# Patient Record
Sex: Female | Born: 1980 | Race: Black or African American | Hispanic: No | Marital: Married | Smoking: Former smoker
Health system: Southern US, Community
[De-identification: ages and names within clinical notes are randomized; demographics above are authoritative.]

## PROBLEM LIST (undated history)

## (undated) DIAGNOSIS — N739 Female pelvic inflammatory disease, unspecified: Secondary | ICD-10-CM

## (undated) DIAGNOSIS — R87629 Unspecified abnormal cytological findings in specimens from vagina: Secondary | ICD-10-CM

## (undated) DIAGNOSIS — E282 Polycystic ovarian syndrome: Secondary | ICD-10-CM

## (undated) DIAGNOSIS — E669 Obesity, unspecified: Secondary | ICD-10-CM

## (undated) DIAGNOSIS — N83209 Unspecified ovarian cyst, unspecified side: Secondary | ICD-10-CM

## (undated) HISTORY — PX: DILATION AND CURETTAGE OF UTERUS: SHX78

## (undated) HISTORY — PX: LAPAROSCOPY: SHX197

## (undated) HISTORY — DX: Polycystic ovarian syndrome: E28.2

## (undated) HISTORY — DX: Female pelvic inflammatory disease, unspecified: N73.9

## (undated) HISTORY — DX: Unspecified ovarian cyst, unspecified side: N83.209

## (undated) HISTORY — DX: Unspecified abnormal cytological findings in specimens from vagina: R87.629

---
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
## 2002-09-05 ENCOUNTER — Other Ambulatory Visit: Admission: RE | Admit: 2002-09-05 | Discharge: 2002-09-05 | Payer: Self-pay | Admitting: Obstetrics and Gynecology

## 2002-09-05 ENCOUNTER — Other Ambulatory Visit: Admission: RE | Admit: 2002-09-05 | Discharge: 2002-09-05 | Payer: Self-pay | Admitting: *Deleted

## 2002-09-19 ENCOUNTER — Emergency Department (HOSPITAL_COMMUNITY): Admission: EM | Admit: 2002-09-19 | Discharge: 2002-09-19 | Payer: Self-pay | Admitting: Emergency Medicine

## 2002-09-24 ENCOUNTER — Inpatient Hospital Stay (HOSPITAL_COMMUNITY): Admission: AD | Admit: 2002-09-24 | Discharge: 2002-09-27 | Payer: Self-pay | Admitting: Obstetrics and Gynecology

## 2002-09-24 ENCOUNTER — Encounter: Payer: Self-pay | Admitting: Obstetrics and Gynecology

## 2002-11-06 ENCOUNTER — Ambulatory Visit (HOSPITAL_COMMUNITY): Admission: RE | Admit: 2002-11-06 | Discharge: 2002-11-06 | Payer: Self-pay | Admitting: Obstetrics and Gynecology

## 2002-11-06 ENCOUNTER — Encounter: Payer: Self-pay | Admitting: Obstetrics and Gynecology

## 2003-02-19 ENCOUNTER — Encounter: Payer: Self-pay | Admitting: Obstetrics and Gynecology

## 2003-02-19 ENCOUNTER — Ambulatory Visit (HOSPITAL_COMMUNITY): Admission: RE | Admit: 2003-02-19 | Discharge: 2003-02-19 | Payer: Self-pay | Admitting: Obstetrics and Gynecology

## 2003-05-04 ENCOUNTER — Encounter: Payer: Self-pay | Admitting: Obstetrics and Gynecology

## 2003-05-04 ENCOUNTER — Ambulatory Visit (HOSPITAL_COMMUNITY): Admission: RE | Admit: 2003-05-04 | Discharge: 2003-05-04 | Payer: Self-pay | Admitting: Obstetrics and Gynecology

## 2003-06-11 ENCOUNTER — Encounter (INDEPENDENT_AMBULATORY_CARE_PROVIDER_SITE_OTHER): Payer: Self-pay | Admitting: *Deleted

## 2003-06-12 ENCOUNTER — Inpatient Hospital Stay (HOSPITAL_COMMUNITY): Admission: AD | Admit: 2003-06-12 | Discharge: 2003-06-13 | Payer: Self-pay | Admitting: Obstetrics and Gynecology

## 2003-12-02 ENCOUNTER — Inpatient Hospital Stay (HOSPITAL_COMMUNITY): Admission: AD | Admit: 2003-12-02 | Discharge: 2003-12-02 | Payer: Self-pay | Admitting: Obstetrics and Gynecology

## 2004-01-31 ENCOUNTER — Emergency Department (HOSPITAL_COMMUNITY): Admission: EM | Admit: 2004-01-31 | Discharge: 2004-01-31 | Payer: Self-pay | Admitting: Emergency Medicine

## 2004-01-31 ENCOUNTER — Other Ambulatory Visit: Admission: RE | Admit: 2004-01-31 | Discharge: 2004-01-31 | Payer: Self-pay | Admitting: Obstetrics and Gynecology

## 2004-05-14 ENCOUNTER — Other Ambulatory Visit: Admission: RE | Admit: 2004-05-14 | Discharge: 2004-05-14 | Payer: Self-pay | Admitting: Obstetrics and Gynecology

## 2006-05-04 ENCOUNTER — Emergency Department (HOSPITAL_COMMUNITY): Admission: EM | Admit: 2006-05-04 | Discharge: 2006-05-04 | Payer: Self-pay | Admitting: Emergency Medicine

## 2009-11-24 ENCOUNTER — Emergency Department (HOSPITAL_COMMUNITY): Admission: EM | Admit: 2009-11-24 | Discharge: 2009-11-24 | Payer: Self-pay | Admitting: Family Medicine

## 2009-11-24 IMAGING — CR DG CHEST 2V
2 series · 2 of 2 positions shown · non-contrast
Comparison: None.

CLINICAL DATA: Chest pain

CHEST - 2 VIEW

[view not recorded (1 of 2)]
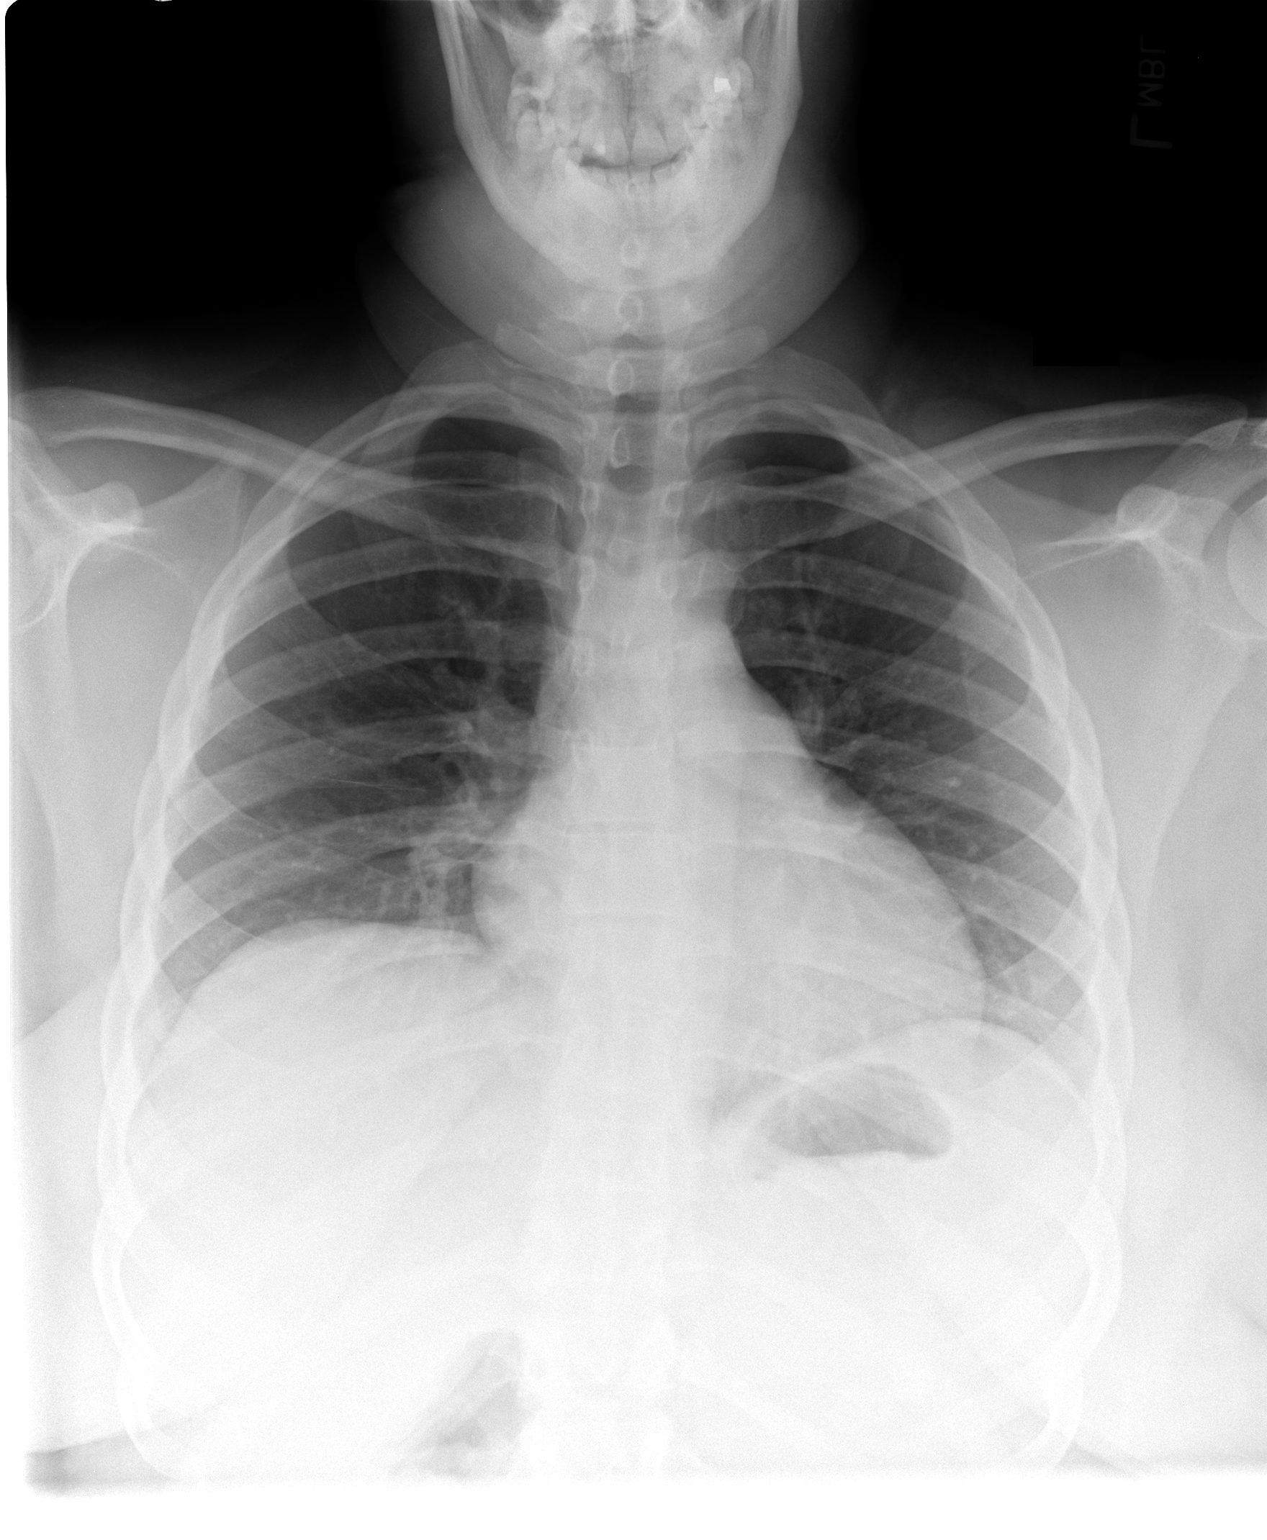

[view not recorded (2 of 2)]
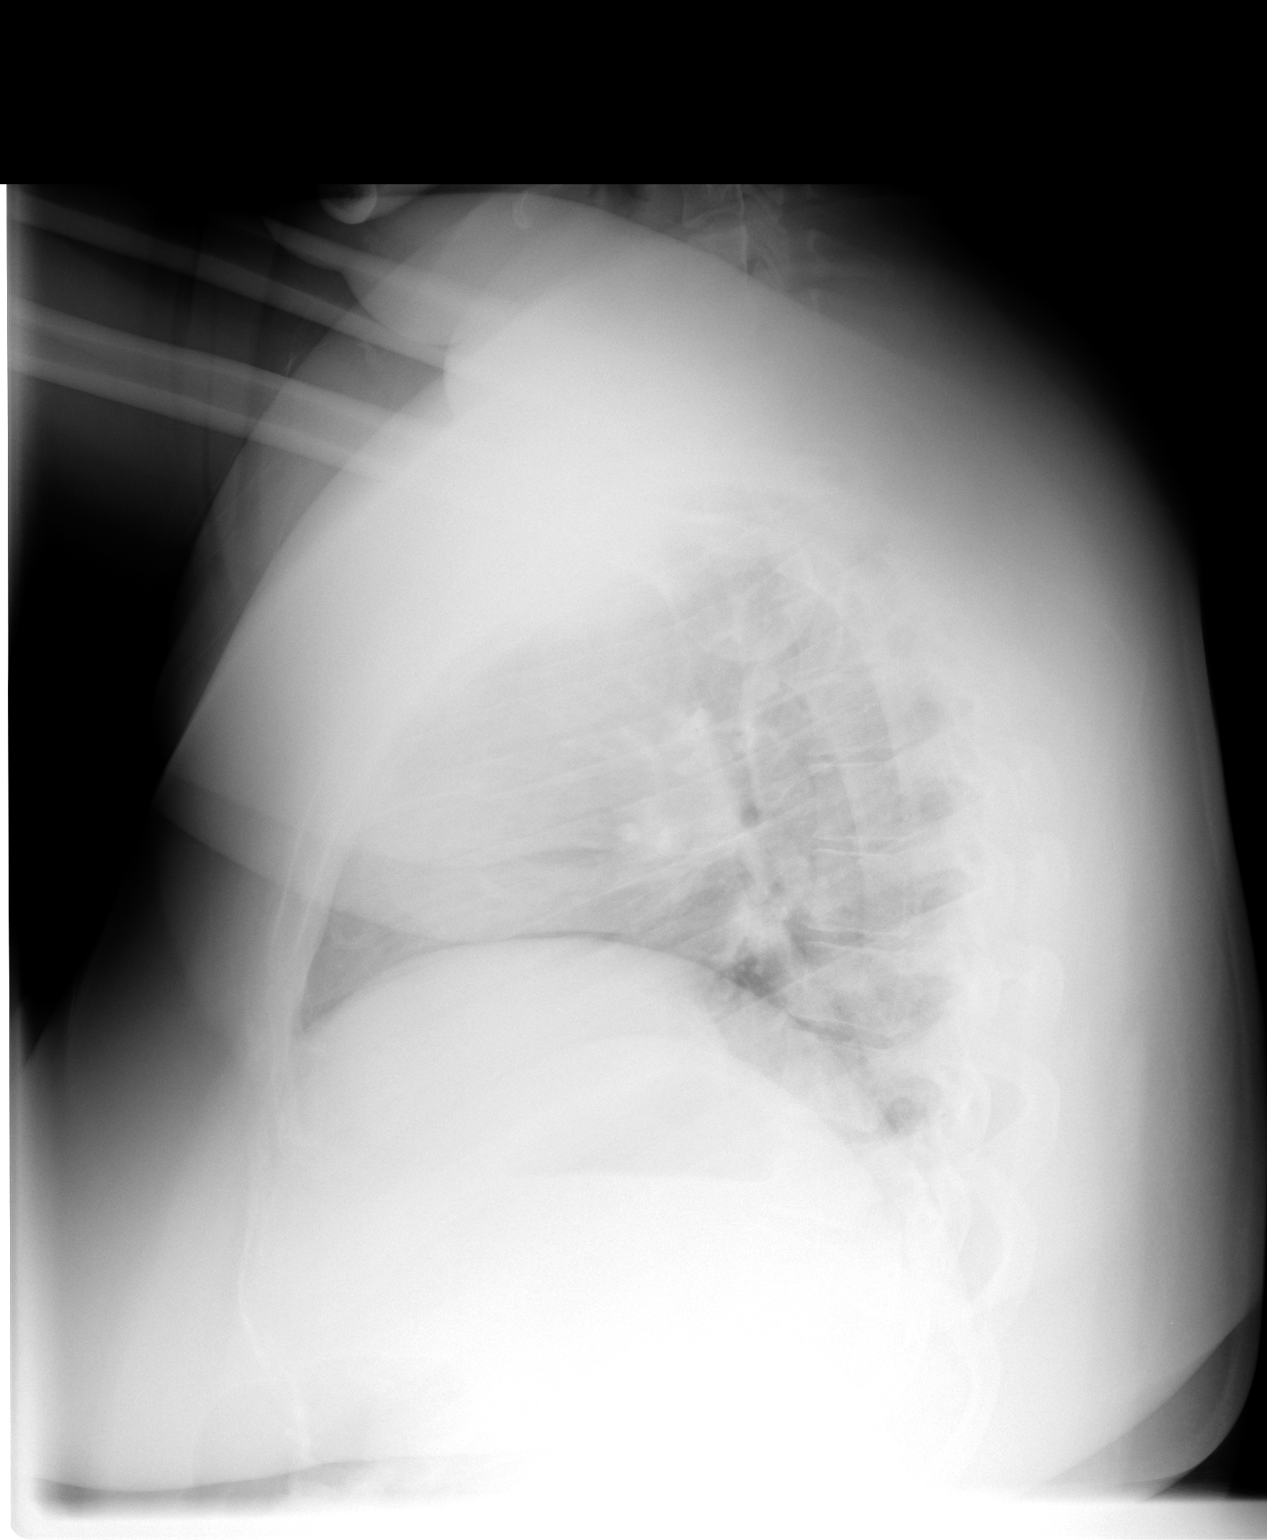

[2 of 2 positions shown; findings below may reference images not displayed]

FINDINGS: The heart size and mediastinal contours are within
normal limits.  Both lungs are clear.  The visualized skeletal
structures are unremarkable.
IMPRESSION: No active cardiopulmonary disease.

## 2010-08-14 ENCOUNTER — Ambulatory Visit (HOSPITAL_COMMUNITY)
Admission: AD | Admit: 2010-08-14 | Discharge: 2010-08-14 | Payer: Self-pay | Source: Home / Self Care | Attending: Obstetrics and Gynecology | Admitting: Obstetrics and Gynecology

## 2010-08-14 IMAGING — US US PELVIS COMPLETE MODIFY
1 series · 13 of 25 positions shown · non-contrast
Comparison: None.

CLINICAL DATA: Acute onset of severe right pelvic pain.  Negative
pregnancy test.

TRANSABDOMINAL AND TRANSVAGINAL ULTRASOUND OF PELVIS
DOPPLER ULTRASOUND OF OVARIES
TECHNIQUE: Both transabdominal and transvaginal ultrasound
examination of the pelvis were performed including evaluation of
the uterus, ovaries, adnexal regions, and pelvic cul-de-sac.
Transabdominal technique was performed for global imaging of the
pelvis.  Transvaginal technique was performed for detailed
evaluation of the endometrium and/or ovaries.  Color and duplex
Doppler ultrasound was utilized to evaluate blood flow to the
ovaries.

[Series 1: us pelvis complete modify · 13 of 72 slices shown]
[im 1/72]
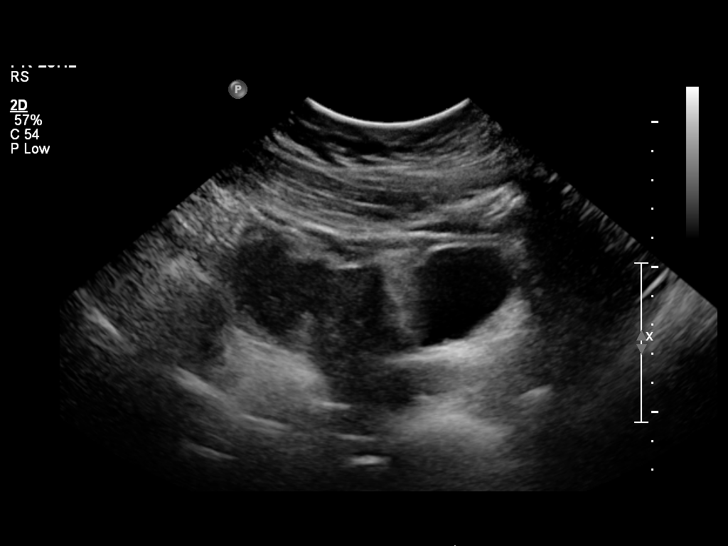
[im 6/72]
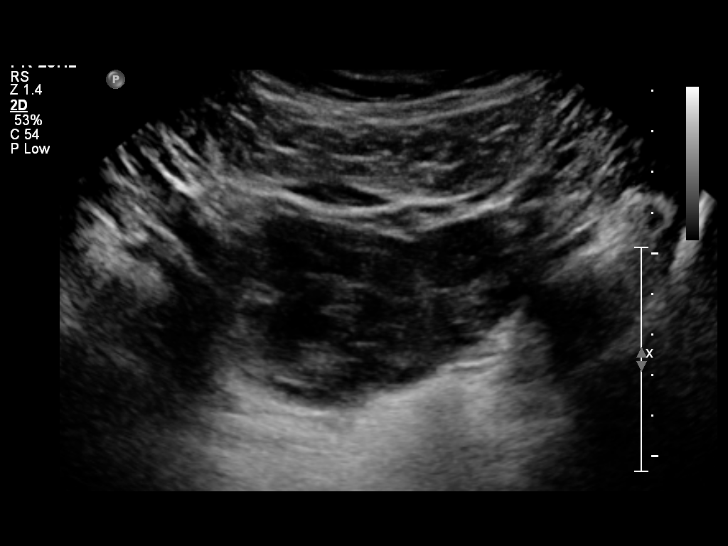
[im 12/72]
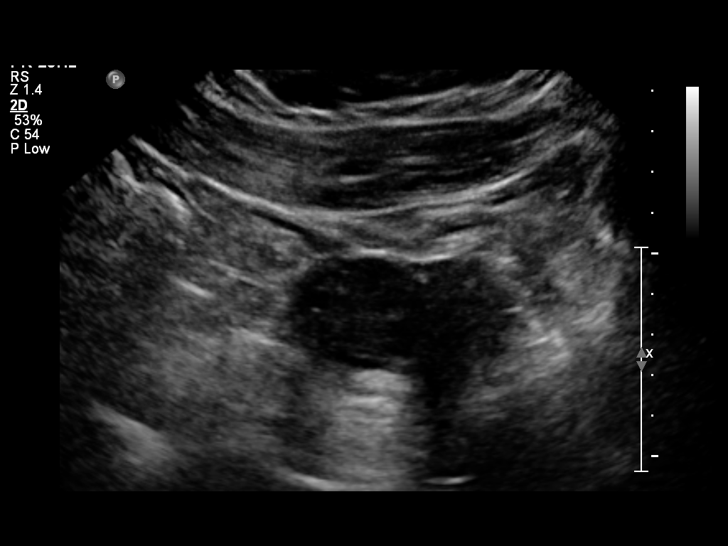
[im 18/72]
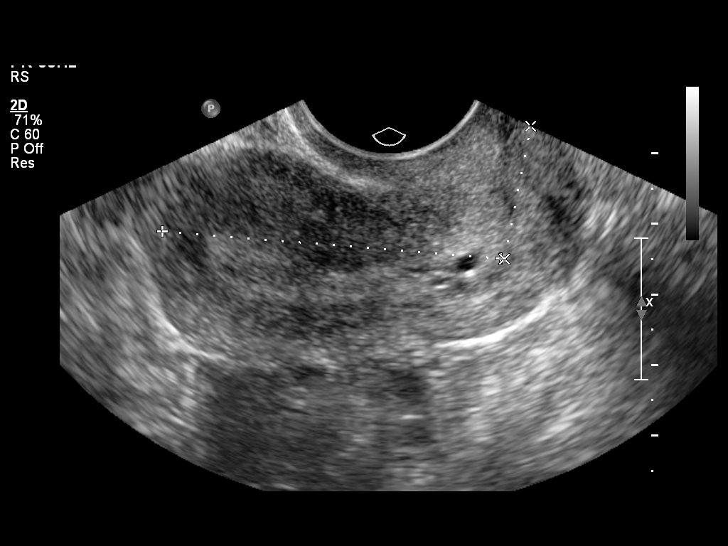
[im 24/72]
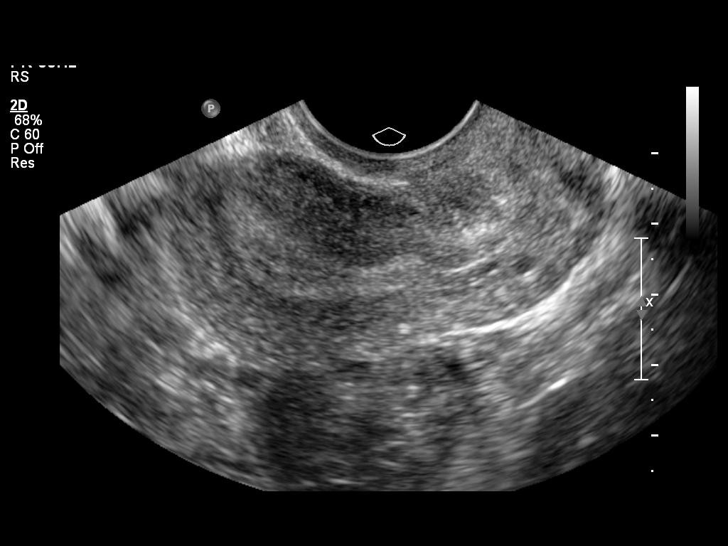
[im 30/72]
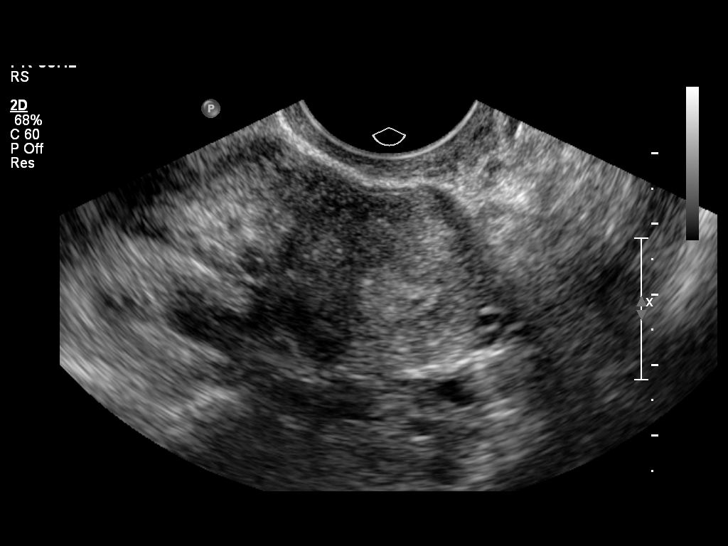
[im 36/72]
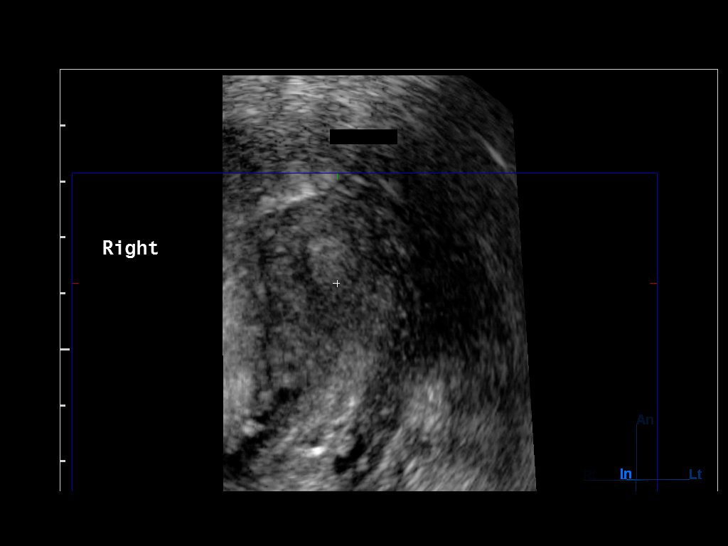
[im 42/72]
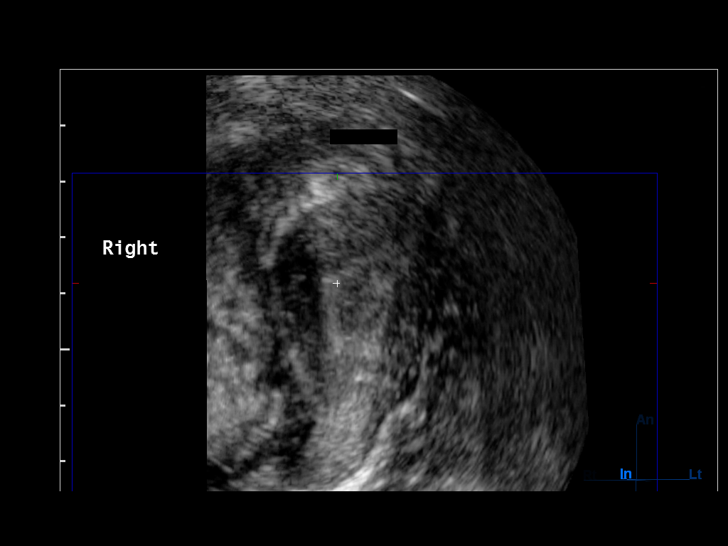
[im 48/72]
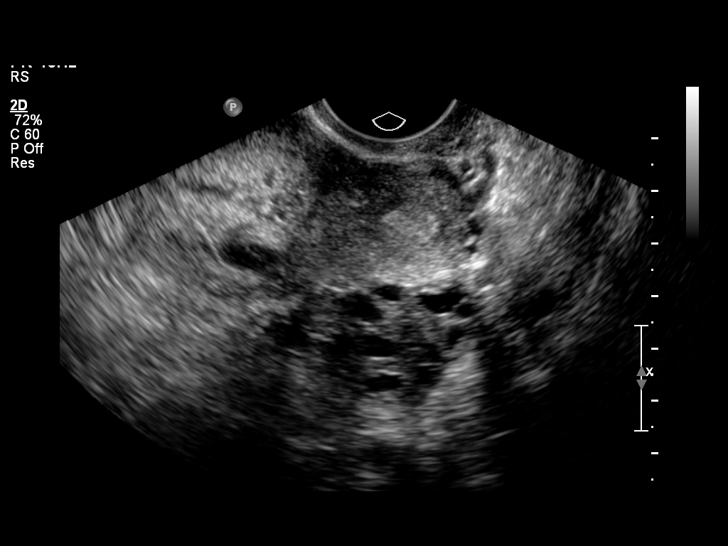
[im 54/72]
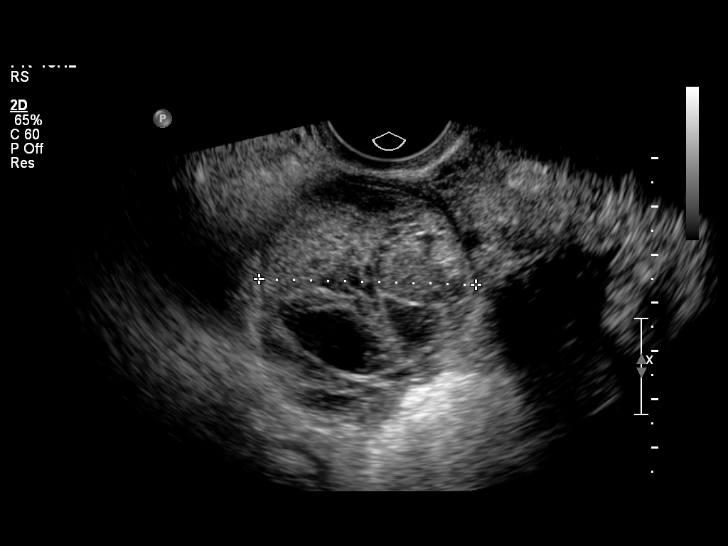
[im 60/72]
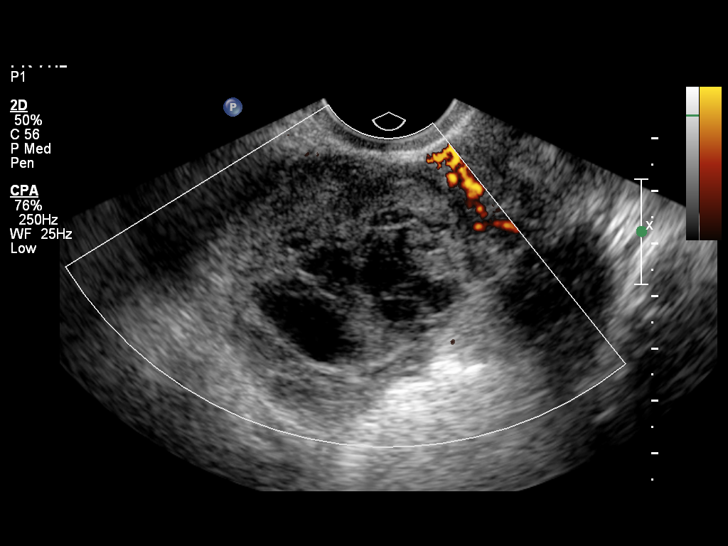
[im 66/72]
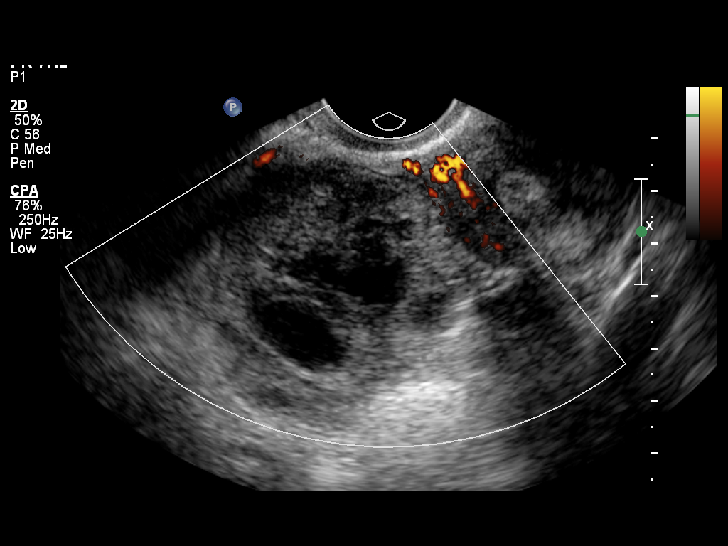
[im 72/72]
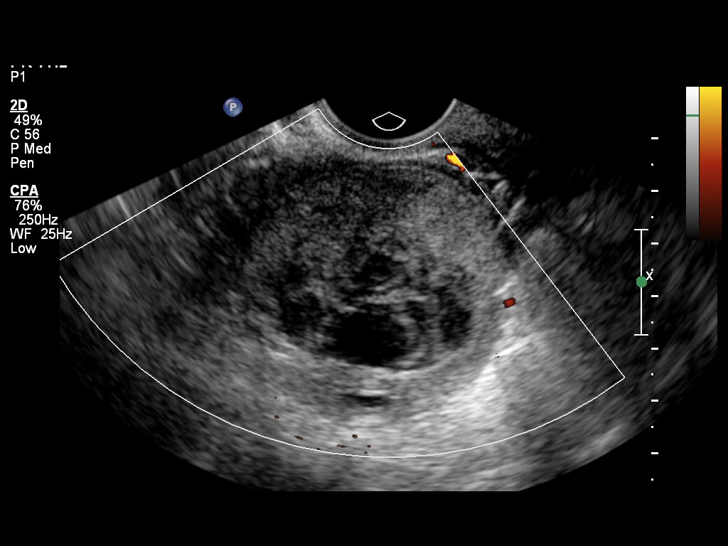

[13 of 25 positions shown; findings below may reference images not displayed]

FINDINGS: Uterus measures 6.7 x 3.0 x 3.5 cm.  No fibroids or other uterine
masses identified.

Endometrium measures 9 mm in thickness.  Within normal limits in
appearance.

Right Ovary measures 5.2 x 4.5 x 4.5 cm.  Enlarged and diffusely
heterogeneous appearance.  No normal appearing ovarian tissue
identified.

Left Ovary measures 3.1 x 2.2 x 2.1 cm.  Normal appearance.

Other Findings:  No other abnormality identified.

Doppler Evaluation:  Color, power, and duplex Doppler evaluation
show a small amount of arterial and venous flow peripheral to or
along the margin of the right ovary, however no blood flow is seen
within the right ovary.
IMPRESSION: 1.  Enlarged, abnormal appearing right ovary, with no evidence of
internal blood flow.  Findings are suspicious for acute ovarian
hemorrhage due to right ovarian torsion.
2.  Normal appearance of uterus and left ovary.

Critical test results telephoned to the patient's TOSEEF nurse TOSE[facility],
at the time of interpretation on [DATE] at [25] hours.

## 2010-11-03 LAB — CBC
HCT: 36.3 % (ref 36.0–46.0)
Platelets: 270 10*3/uL (ref 150–400)
RBC: 4.11 MIL/uL (ref 3.87–5.11)
RDW: 14.1 % (ref 11.5–15.5)
WBC: 8.5 10*3/uL (ref 4.0–10.5)

## 2010-11-03 LAB — URINALYSIS, ROUTINE W REFLEX MICROSCOPIC
Bilirubin Urine: NEGATIVE
Ketones, ur: NEGATIVE mg/dL
Protein, ur: NEGATIVE mg/dL
Urobilinogen, UA: 0.2 mg/dL (ref 0.0–1.0)

## 2010-11-12 LAB — POCT I-STAT, CHEM 8
Calcium, Ion: 1.12 mmol/L (ref 1.12–1.32)
HCT: 41 % (ref 36.0–46.0)
Sodium: 139 mEq/L (ref 135–145)
TCO2: 28 mmol/L (ref 0–100)

## 2010-11-12 LAB — D-DIMER, QUANTITATIVE: D-Dimer, Quant: <0.22 ug/mL-FEU (ref 0.00–0.48)

## 2010-11-12 LAB — POCT PREGNANCY, URINE: Preg Test, Ur: NEGATIVE

## 2011-01-09 NOTE — Discharge Summary (Signed)
NAME:  Erin [name], Erin [name]                         ACCOUNT NO.:  [id number]   MEDICAL RECORD NO.:  [id number]                   PATIENT TYPE:  INP   LOCATION:  9130                                 FACILITY:  [facility]   PHYSICIAN:  Osborn Coho, M.D.                DATE OF BIRTH:  05/31/1981   DATE OF ADMISSION:  09/24/2002  DATE OF DISCHARGE:  09/27/2002                                 DISCHARGE SUMMARY   ADMISSION DIAGNOSIS:  Probable tubo-ovarian abscess versus pelvic  inflammatory disease.   DISCHARGE DIAGNOSIS:  Probable tubo-ovarian abscess versus pelvic  inflammatory disease.   HOSPITAL COURSE:  The patient is a 30 year old, gravida 0, who had recently  tested positive for Gonorrhea and Chlamydia.  The patient reported continued  occasional nausea and vomiting even after treatment with ceftriaxone and  doxycycline in the office.  An office ultrasound showed questionable fluid  in the fallopian tube with simple left ovarian cyst and right complex  ovarian cyst.  The patient had bilateral lower quadrant discomfort,  bilateral adnexal discomfort, and was positive for cervical motion  tenderness.  The patient's white blood cell count was not elevated, it was  5.8, and the patient was afebrile.  The patient did not show overt signs of  infection in this manner which can be due to suboptimal outpatient therapy  with persistent symptoms.  The patient was admitted for IV antibiotics and  pelvic ultrasound.  The patient was placed on ampicillin, gentamicin, and  Clindamycin.  Her pain was controlled with Percocet and ibuprofen.  The  patient's symptoms improved over the next several days and was discharged  home on September 27, 2002, with a benign examination.  She was discharged  home on Augmentin and Doxycycline and was to follow up with Dr. Pennie Rushing in  the office in two weeks for test of cure and a follow-up ultrasound in six  weeks.   LABORATORY DATA:  White blood cell count 5.8 on  admission, hematocrit 37.1,  and platelets 321.  Positive Gonorrhea and Chlamydia probes from September 22, 2002, in the office. Ultrasound report in the hospital showed bilateral  hydrosalpinx and bilateral cysts with a simple right cyst and a left tubular  fixed structure/complex cyst.   CONDITION ON DISCHARGE:  Good.   DISCHARGE MEDICATIONS:  1. Augmentin 875 mg b.i.d.  2. Doxycycline 100 mg b.i.d. for remainder of 10 days.   DISCHARGE INSTRUCTIONS:  Pelvic rest.   FOLLOW UP:  Follow up scheduled with Dr. Pennie Rushing in two weeks for test of  cure. The patient was to be scheduled for a repeat ultrasound in six weeks.                                               Osborn Coho,  M.D.    AR/MEDQ  D:  12/04/2002  T:  12/04/2002  Job:  [id number]

## 2011-01-09 NOTE — Op Note (Signed)
NAME:  Erin [name], Erin [name]                         ACCOUNT NO.:  [id number]   MEDICAL RECORD NO.:  [id number]                   PATIENT TYPE:  OBV   LOCATION:  9306                                 FACILITY:  [facility]   PHYSICIAN:  Hal Morales, M.D.             DATE OF BIRTH:  11-Feb-1981   DATE OF PROCEDURE:  06/11/2003  DATE OF DISCHARGE:                                 OPERATIVE REPORT   PREOPERATIVE DIAGNOSES:  1. Pelvic pain.  2. History of pelvic inflammatory disease.  3. History of bilateral tubal ovarian complex.   POSTOPERATIVE DIAGNOSES:  1. Pelvic pain.  2. History of pelvic inflammatory disease .  3. Extensive pelvic adhesions.  4. Right and left tubal ovarian complexes, consistent with chronic pelvic     inflammatory disease.   OPERATION:  1. Operative laparoscopy with lysis of adhesions.  2. Biopsy of right ovarian cyst.   ANESTHESIA:  General orotracheal.   ESTIMATED BLOOD LOSS:  Less than 50 cc.   COMPLICATIONS:  None.   FINDINGS:  The uterus was normal size.  The right ovary was enlarged to 5  cm, with what appeared to be a corpus luteum cyst.  The left ovary was  normal sized.  Both ovaries were involved with adhesions to the overlying  tubes.  The right tube was adherent to the right pelvic sidewall, as well as  to the right ovary.  The posterior cul-de-sac contained fine adhesions  between the peritoneum and the posterior uterus.  In the left pelvic  sidewall there were fine adhesions between the left posterior uterus and the  left pelvic sidewall.   SPECIMENS TO PATHOLOGY:  1. Pelvic peritoneal fluid for anaerobic and aerobic cultures.  2. Adhesions, rule out endometriosis.  3. Biopsy of ovarian cyst, probable corpus luteum.   PROCEDURE:  The patient was taken to the operating room, after appropriate  identification and placed on the operating room table.  After the attainment  of adequate general anesthesia, she was placed in the modified  lithotomy  position.  The abdomen, perineum and vagina were prepped with multiple  layers of Betadine.  A Foley catheter was inserted into the bladder and  connected to straight drainage.  A single-toothed tenaculum was placed on  the anterior cervix.  The abdomen was draped as a sterile field.  Subumbilical and suprapubic  injections of 0.25% Marcaine for a total of 10  cc were undertaken.  A subumbilical incision was made and Veress cannula  placed through that incision into the peritoneal cavity.  A pneumoperitoneum  was created with 3 L of CO2.  The Veress cannula was removed and the  laparoscopic trocar placed through that incision into the peritoneal cavity.  The laparoscope was placed through the trocar sleeve.  Suprapubic incisions  were made to the right and left at midline and laparoscopic probe trocars  placed through those incisions into the peritoneal cavity,  under direct  visualization.  The above-noted findings were made and documented.  The  samples from the posterior cul-de-sac for culture were obtained.  Using a  combination of sharp dissection and harmonic scalpel, the adhesions in the  posterior cul-de-sac were lysed, as were the adhesions between the left  posterior uterus and left pelvic sidewall; as were the adhesions between the  left tube and ovary, and the right tube and ovary.  The right ovary  contained a cyst which upon dissection of the cortex appeared to be a corpus  luteum cyst.  A biopsy of the cyst wall was obtained and harmonic scalpels  used to achieve hemostasis.  Copious irrigation was carried out, after the  lysis of adhesion was complete.  Approximately 100 cc of warm saline was  left in the pelvis.  A total of 4 L of irrigation fluid was used to  copiously irrigate the pelvis, which had the stigmata of apparent old  infection.  All instruments were then removed from the peritoneal cavity  under direct visualization, as the CO2 was allowed to escape.   A  subumbilical incision was closed with a fascial suture of 0 Vicryl, then a  subcuticular suture of 3-0 Vicryl.  The suprapubic incisions were both  closed with subcuticular sutures of 3-0 Vicryl.  The single-toothed  tenaculum was removed, as was the Foley catheter, and sterile dressings  applied to the incisions.  The patient was awakened from general anesthesia  and taken to the recovery room in satisfactory condition; having tolerated  the procedure well.  Sponge and instrument count was correct.   A discussion with the patient and her mother postoperatively led to  admission to the hospital for intravenous antibiotics for apparent chronic  pelvic inflammatory disease, with significant pelvic adhesions.                                               Hal Morales, M.D.    VPH/MEDQ  D:  06/11/2003  T:  06/11/2003  Job:  [id number]

## 2011-01-09 NOTE — Discharge Summary (Signed)
NAME:  Erin [name], Erin [name]                         ACCOUNT NO.:  [id number]   MEDICAL RECORD NO.:  [id number]                   PATIENT TYPE:  INP   LOCATION:  9306                                 [facility]   PHYSICIAN:  Hal Morales, M.D.             DATE OF BIRTH:  05-21-1981   DATE OF ADMISSION:  06/11/2003  DATE OF DISCHARGE:  06/13/2003                                 DISCHARGE SUMMARY   DISCHARGE DIAGNOSES:  1. Pelvic pain.  2. History of pelvic inflammatory disease.  3. Pelvic adhesions.  4. Left and right tuboovarian complex consistent with chronic pelvic     inflammatory disease.   OPERATION:  On the date of admission the patient underwent an operative  laparoscopy with lysis of adhesions and biopsy of her right ovarian cyst,  tolerating procedure well.  The patient was found to have a normal-size  uterus and left ovary.  Her right ovary was enlarged to 5 cm with a corpus  luteum cyst.  Both ovaries were involved with adhesions to the overlying  tubes with the right tube adherent to the right pelvic sidewall.  The  patient's posterior cul-de-sac contained fine adhesion.   HISTORY OF PRESENT ILLNESS:  Ms. Erin [name] is a 30 year old single African-  American female with a history of pelvic inflammatory disease who is para 0  presenting for diagnostic laparoscopy because of persistent pelvic pain.  Please see the patient's dictated History and Physical Examination for  details.   PREOPERATIVE PHYSICAL EXAMINATION:  VITAL SIGNS:  Blood pressure 110/70,  weight is 222.5 pounds, height is 5 feet 3.25 inches tall.  GENERAL:  General exam is within normal limits; however, do note on  abdominal exam it is diffusely tender with guarding in the left lower  quadrant greater than the right lower quadrant.  However, there was no  rebound or organomegaly.  PELVIC:  EG/BUS was within normal limits.  The vagina was rugous.  Cervix is  nontender without lesion.  Uterus appears normal  size, shape, and  consistency though exam was limited by the patient's body habitus.  There is  tenderness over the fundal area and the adnexa are without masses though  there is tenderness bilaterally left greater than right.  Rectovaginal exam  is without masses.  There is tenderness in the posterior cul-de-sac.   HOSPITAL COURSE:  On the date of admission the patient underwent  aforementioned procedures, tolerating them well.  Postoperative course was  unremarkable with the patient resuming bowel and bladder function by  postoperative day #2 and therefore deemed ready for discharge home.  Postoperative hemoglobin is 11.5 (preoperative hemoglobin 13.5).   DISCHARGE MEDICATIONS:  1. Toradol 10 mg one tablet q.6h. as needed for pain.  2. Tequin 400 mg one tablet daily for 12 days.  3. Metronidazole 500 mg four times daily for 12 days.   FOLLOW-UP:  The patient is to follow up with  Dr. Pennie Rushing at [facility] in two weeks.   DISCHARGE INSTRUCTIONS:  The patient was given a copy of [facility]  [facility] home care instructions for laparoscopy.  She was further advised  to place nothing in her vaginal until after her postoperative exam.  The  patient's diet is regular.   FINAL PATHOLOGY:  1. Biopsy pelvic adhesions:  Fibrous adhesions with focal mesothelial     hyperplasia.  2. Biopsy ovarian cyst:  Benign cyst most consistent with follicular cyst.     Erin [name].                    Hal Morales, M.D.    EJP/MEDQ  D:  07/11/2003  T:  07/12/2003  Job:  [id number]

## 2011-01-09 NOTE — H&P (Signed)
NAME:  Erin [name], Erin [name]                         ACCOUNT NO.:  [id number]   MEDICAL RECORD NO.:  [id number]                   PATIENT TYPE:  AMB   LOCATION:  SDC                                  [facility]:  [facility]   PHYSICIAN:  Hal Morales, M.D.             DATE OF BIRTH:  1981/03/25   DATE OF ADMISSION:  DATE OF DISCHARGE:                                HISTORY & PHYSICAL   HISTORY OF PRESENT ILLNESS:  Erin [name] is a 30 year old single African-  American female para 0 who presents for diagnostic laparoscopy because of  persistent pelvic pain.  In February 2004 the patient was admitted to  [facility] for refractory pelvic pain at which time she  was diagnosed with pelvic inflammatory disease (having positive gonorrhea  and chlamydia cultures) along with bilateral hydrosalpinges.  The patient  was treated in the hospital with appropriate IV antibiotics and discharged  with oral antibiotics which she took for 14 days.  A follow-up pelvic  ultrasound in March 2004 showed resolution of her hydrosalpinges with  residual prominence of her right broad ligament which could have reflected  some right tubal thickening.  Since being discharged from the hospital in  February the patient has continued to experience pelvic pain which she  describes on occasion as if her bottom is going to fall out.  Additionally, she states that the pain is worse on the left than the right;  is sharp, dull, and crampy in nature which increases when she sits as well  as when she voids.  Initially this pain was intermittent; however, now it is  daily and constant.  She rates her pain at a 4-9 on a 10-point scale as the  degrees of intensity will vary.  However, she does experience some relief  from Naprosyn and Vicodin though they both put her to sleep.  In June 2004  the patient was again treated for gonorrhea and chlamydia (her test of cure  in March 2004 had been negative).  A follow-up  ultrasound in June 2004 was  within normal limits with no residual signs of tubal thickening or  abnormality.  A CT scan of the abdomen and pelvis with and without contrast  in September 2004 was also totally normal.  Throughout her ordeal the  patient denies any diarrhea, vaginitis symptoms, dysuria, urinary frequency,  or dyspareunia.  Due to the persistent nature of the patient's pelvic pain  and lack of response to oral therapy she has decided to undergo diagnosed  laparoscopy for further evaluation of her pain.   PAST MEDICAL HISTORY:  1. OB history:  Gravida 0 para 0.  2. GYN history:  Menarche at 30 years old.  The patient's menstrual periods     are regular with dysmenorrhea.  She uses birth control pills for     contraception.  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  The     patient's last Pap smear was January 2004 which revealed benign reactive     changes.  3. Medical history:  Positive for condyloma acuminata and dysmenorrhea.   FAMILY HISTORY:  Positive for asthma and endometriosis.   SOCIAL HISTORY:  The patient is single and she is a Holiday representative at [employer].   HABITS:  She does not use alcohol nor tobacco.   CURRENT MEDICATIONS:  1. Vicodin one tablet q.4-6h. as needed for pain.  2. Naproxen one tablet twice daily with food as needed for pain.  3. Extra Strength Tylenol two tablets q.4h. as needed for pain.  4. Ortho Tri-Cyclen Lo one tablet daily.   ALLERGIES:  No known drug allergies.   REVIEW OF SYSTEMS:  Negative except as mentioned in history of present  illness.   PHYSICAL EXAMINATION:  VITAL SIGNS:  The patient's blood pressure is 110/70,  weight is 222.5 pounds, height is 5 feet 3.25 inches tall.  NECK:  Supple.  There is no adenopathy or thyromegaly.  HEART:  Regular rate and rhythm.  There is no murmur.  LUNGS:  Clear to auscultation.  There  are no wheezes, rales, or rhonchi.  BACK:  No CVA tenderness.  ABDOMEN:  Diffusely tender with guarding in the left lower quadrant greater  than the right lower quadrant.  However, there is no rebound or  organomegaly.  EXTREMITIES:  Without clubbing, cyanosis, or edema.  PELVIC:  EG/BUS is within normal limits.  Vagina is rugous.  Cervix is  nontender without lesions.  Uterus appears normal size, shape, and  consistency though exam is limited by body habitus.  There is tenderness  over the fundal area.  Adnexa without masses though there is tenderness  bilaterally, left greater than right.  Rectovaginal exam without masses.  There is tenderness in the posterior cul-de-sac.   IMPRESSION:  1. Pelvic pain.  2. Status post pelvic inflammatory disease.   DISPOSITION:  A discussion was held with the patient regarding further  evaluation/management of her presenting symptoms to include diagnostic  laparoscopy or Lupron therapy.  The patient has decided to undergo  diagnostic laparoscopy for further evaluation of her persistent pelvic pain.  She understands the implications for this procedure along with its risks  which include but are not limited to reaction to anesthesia, damage to  adjacent organs, infection, and bleeding.  The patient has consented to  undergo diagnostic laparoscopy at [facility] [place] on June 11, 2003 at 1:45 p.m.     [place] J. Adline Peals.                    Hal Morales, M.D.    EJP/MEDQ  D:  06/06/2003  T:  06/06/2003  Job:  [id number]

## 2011-11-09 ENCOUNTER — Emergency Department (HOSPITAL_COMMUNITY): Payer: Self-pay

## 2011-11-09 ENCOUNTER — Encounter (HOSPITAL_COMMUNITY): Payer: Self-pay | Admitting: *Deleted

## 2011-11-09 ENCOUNTER — Emergency Department (HOSPITAL_COMMUNITY)
Admission: EM | Admit: 2011-11-09 | Discharge: 2011-11-10 | Disposition: A | Discharge status: Home / Self Care | Payer: Self-pay | Attending: Emergency Medicine | Admitting: Emergency Medicine

## 2011-11-09 DIAGNOSIS — T148XXA Other injury of unspecified body region, initial encounter: Secondary | ICD-10-CM | POA: Insufficient documentation

## 2011-11-09 DIAGNOSIS — M79609 Pain in unspecified limb: Secondary | ICD-10-CM | POA: Insufficient documentation

## 2011-11-09 DIAGNOSIS — M25569 Pain in unspecified knee: Secondary | ICD-10-CM | POA: Insufficient documentation
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
## 2011-11-10 NOTE — ED Provider Notes (Signed)
Medical screening examination/treatment/procedure(s) were performed by non-physician practitioner and as supervising physician I was immediately available for consultation/collaboration.   Lyanne Co, MD 11/10/11 [phone]

## 2011-11-11 ENCOUNTER — Encounter (HOSPITAL_COMMUNITY): Payer: Self-pay | Admitting: *Deleted

## 2011-11-11 ENCOUNTER — Emergency Department (HOSPITAL_COMMUNITY)
Admission: EM | Admit: 2011-11-11 | Discharge: 2011-11-11 | Disposition: A | Discharge status: Home / Self Care | Payer: Self-pay | Attending: Emergency Medicine | Admitting: Emergency Medicine

## 2011-11-11 DIAGNOSIS — M79605 Pain in left leg: Secondary | ICD-10-CM

## 2011-11-11 DIAGNOSIS — M79609 Pain in unspecified limb: Secondary | ICD-10-CM | POA: Insufficient documentation

## 2011-11-11 MED ORDER — DIAZEPAM 5 MG PO TABS: 5.0000 mg | ORAL_TABLET | Freq: Two times a day (BID) | ORAL | Status: AC
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
## 2011-11-11 NOTE — Progress Notes (Signed)
Orthopedic Tech Progress Note Patient Details:  Erin [name] 1981-07-02 [id number]  Other Ortho Devices Type of Ortho Device: Knee Sleeve Ortho Device Location: left knee Ortho Device Interventions: Application   Nikki Dom 11/11/2011, 4:15 PM
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
## 2011-11-11 NOTE — ED Notes (Signed)
Ortho paged for knee sleeve 

## 2011-11-12 NOTE — ED Provider Notes (Signed)
Medical screening examination/treatment/procedure(s) were performed by non-physician practitioner and as supervising physician I was immediately available for consultation/collaboration.   Raju Coppolino, MD 11/12/11 1445 

## 2019-11-27 ENCOUNTER — Emergency Department (HOSPITAL_BASED_OUTPATIENT_CLINIC_OR_DEPARTMENT_OTHER): Payer: BC Managed Care – PPO

## 2019-11-27 ENCOUNTER — Other Ambulatory Visit: Payer: Self-pay

## 2019-11-27 ENCOUNTER — Emergency Department (HOSPITAL_BASED_OUTPATIENT_CLINIC_OR_DEPARTMENT_OTHER)
Admission: EM | Admit: 2019-11-27 | Discharge: 2019-11-27 | Disposition: A | Discharge status: Home / Self Care | Payer: BC Managed Care – PPO | Attending: Emergency Medicine | Admitting: Emergency Medicine

## 2019-11-27 ENCOUNTER — Encounter (HOSPITAL_BASED_OUTPATIENT_CLINIC_OR_DEPARTMENT_OTHER): Payer: Self-pay | Admitting: Emergency Medicine

## 2019-11-27 DIAGNOSIS — R519 Headache, unspecified: Secondary | ICD-10-CM | POA: Insufficient documentation

## 2019-11-27 DIAGNOSIS — Z87891 Personal history of nicotine dependence: Secondary | ICD-10-CM | POA: Insufficient documentation

## 2019-11-27 HISTORY — DX: Obesity, unspecified: E66.9

## 2019-11-27 LAB — PREGNANCY, URINE: Preg Test, Ur: NEGATIVE

## 2019-11-27 IMAGING — CT CT HEAD W/O CM
3 series · 15 of 47 positions shown, 18 images · non-contrast
Comparison: None.

CLINICAL DATA: Headache after heavy exercise

EXAM:
CT HEAD WITHOUT CONTRAST
TECHNIQUE: Contiguous axial images were obtained from the base of the skull
through the vertex without intravenous contrast.

[Series 2: head wo · axial · 0.42mm/px · z∈[-102,+24]mm · 9 of 31 slices shown, 12 images]
[im 3/31  brain]
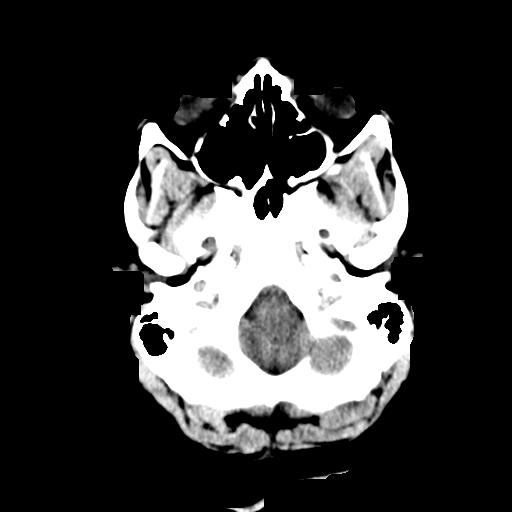
[im 3/31  bone]
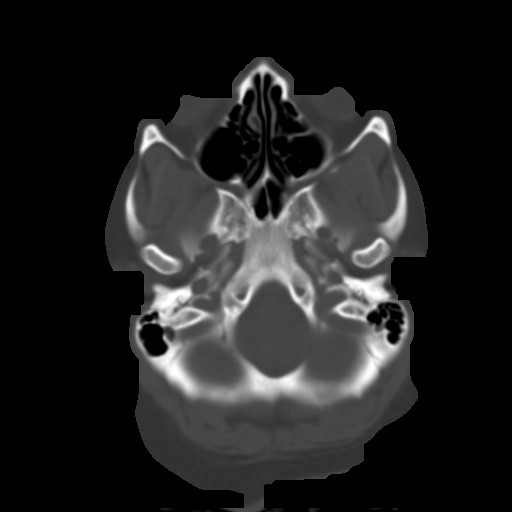
[im 6/31  brain]
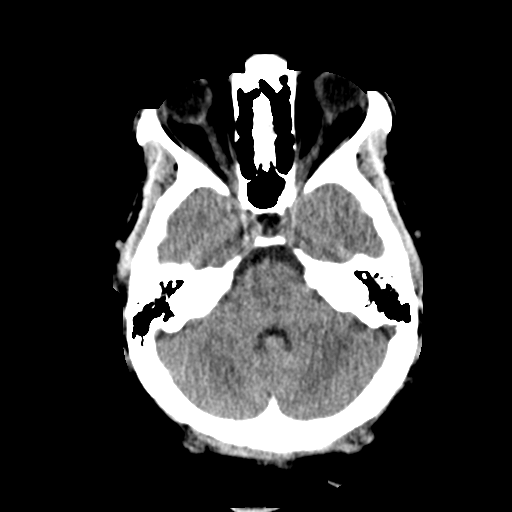
[im 9/31  brain]
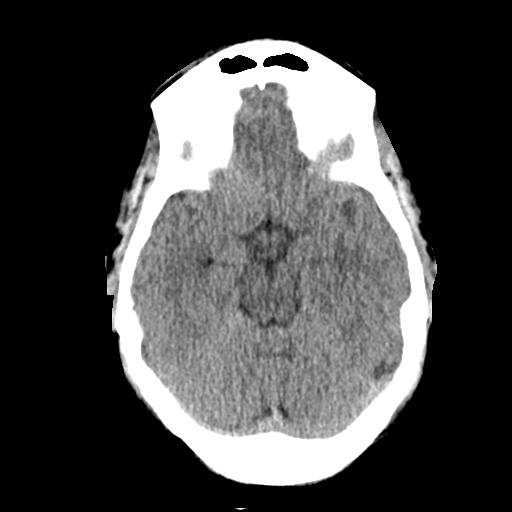
[im 12/31  brain]
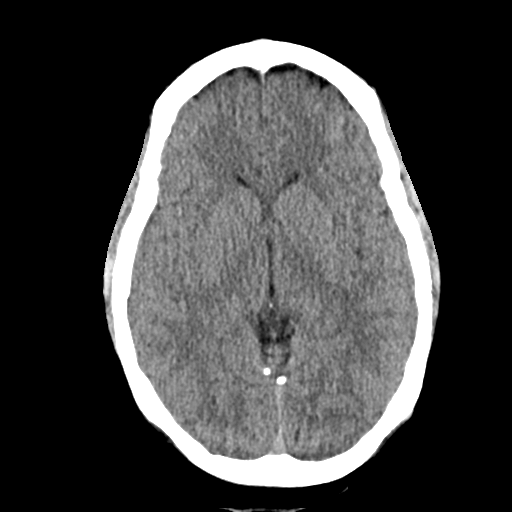
[im 16/31  brain]
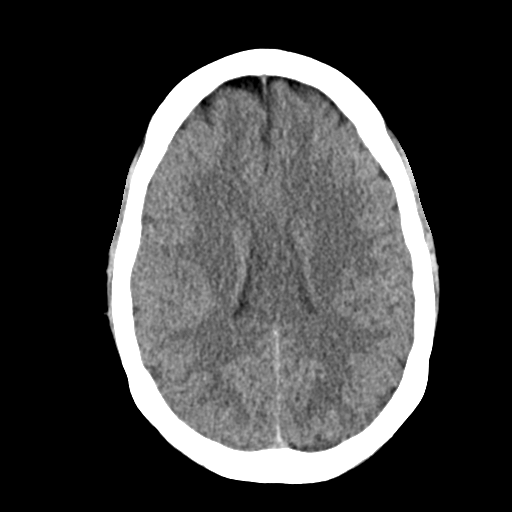
[im 16/31  bone]
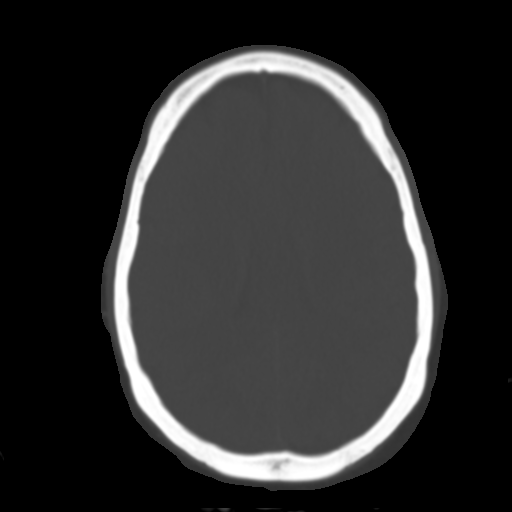
[im 19/31  brain]
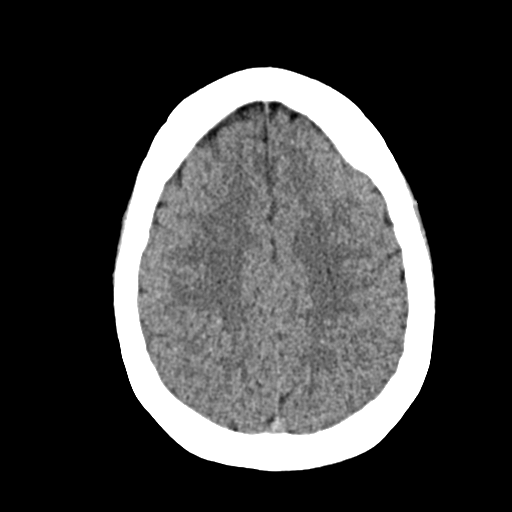
[im 22/31  brain]
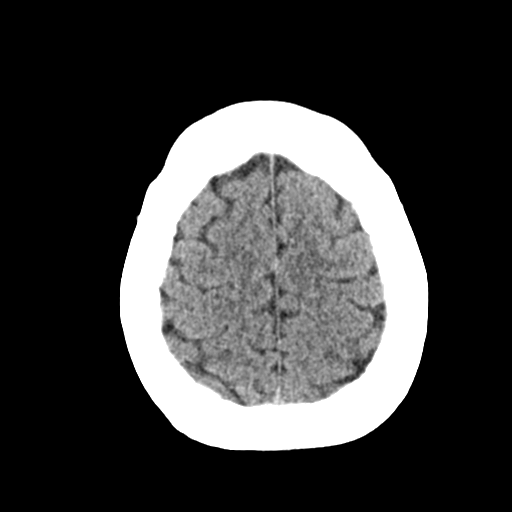
[im 25/31  brain]
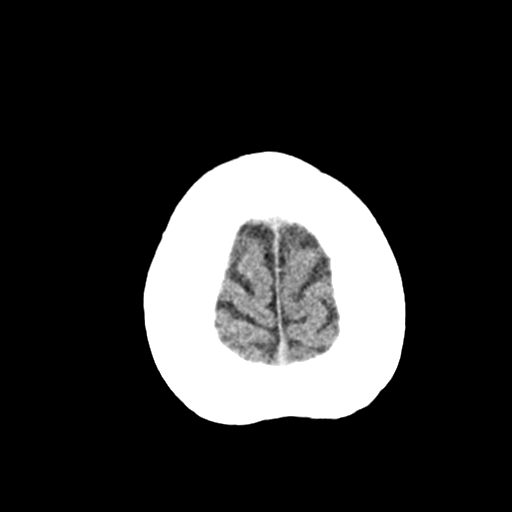
[im 28/31  brain]
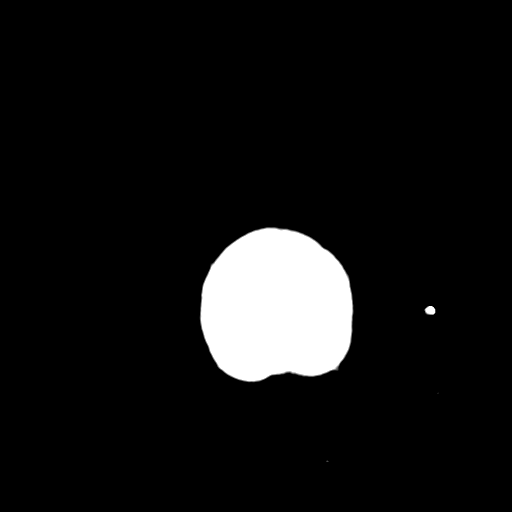
[im 28/31  bone]
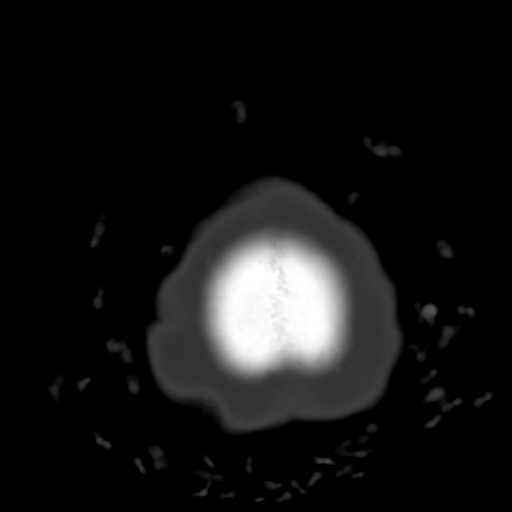

[Series 4: coronal soft · coronal · 0.30mm/px · 3 of 80 slices shown]
[im 27/80  brain]
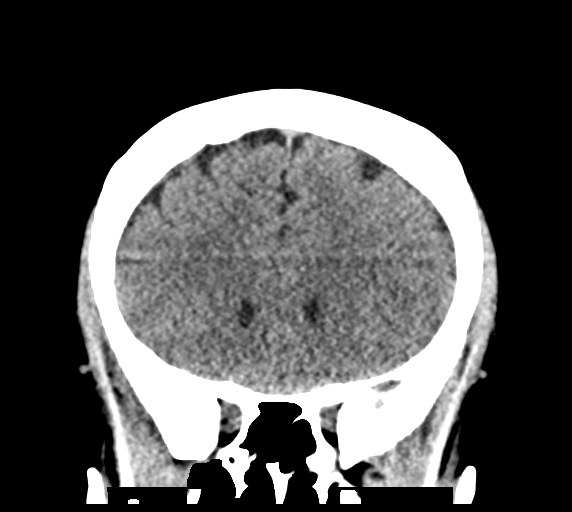
[im 36/80  brain]
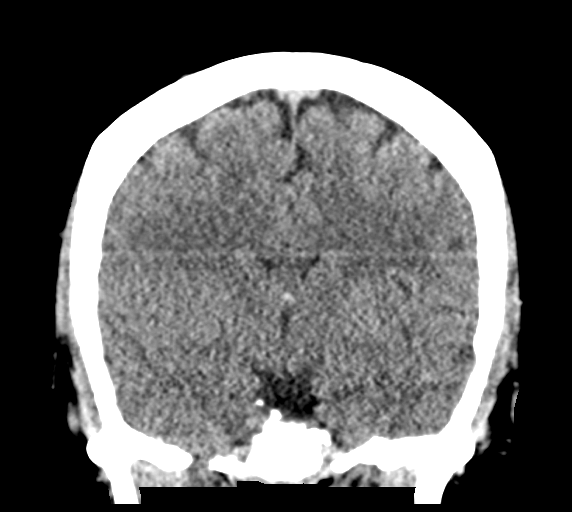
[im 44/80  brain]
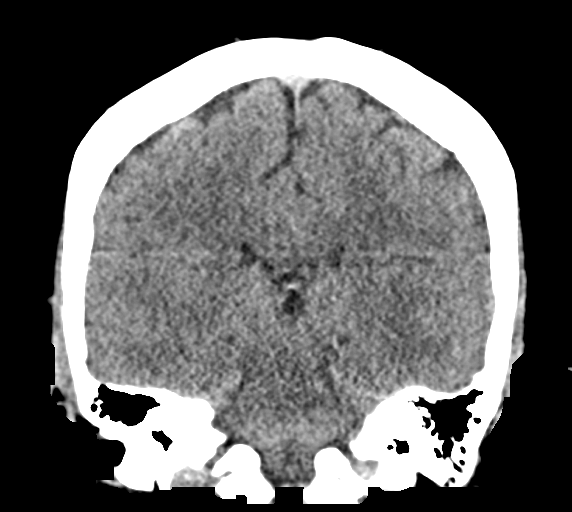

[Series 5: sag soft · sagittal · 0.29mm/px · 3 of 54 slices shown]
[im 18/54  brain]
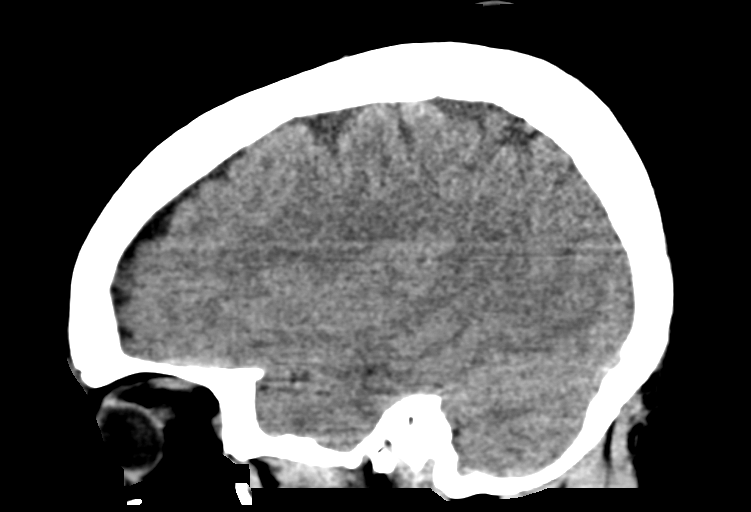
[im 27/54  brain]
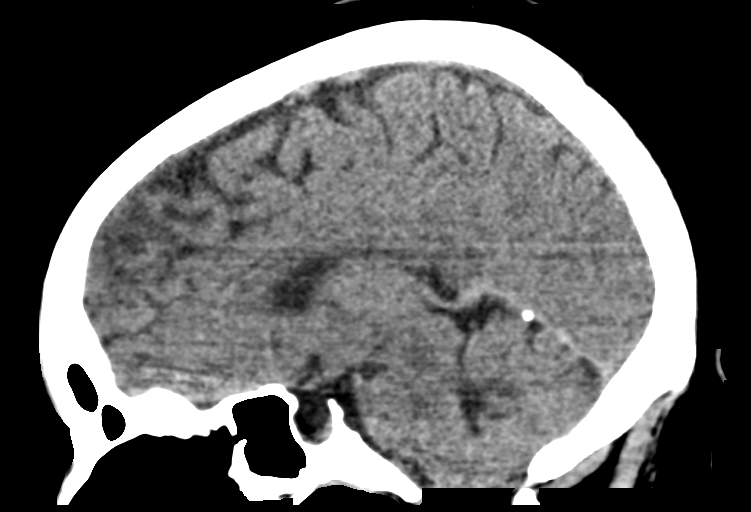
[im 36/54  brain]
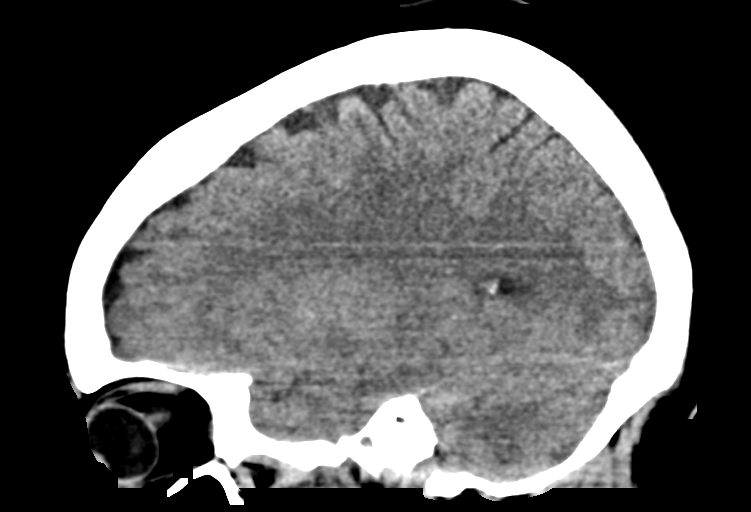

[15 of 47 positions shown; findings below may reference images not displayed]

FINDINGS: Brain: Ventricles and sulci are normal in size and configuration. No
evident cranial mass, hemorrhage, extra-axial fluid collection, or
midline shift. The brain parenchyma appears unremarkable. No evident
acute infarct.

Vascular: No hyperdense vessel.  No evident vascular calcification.

Skull: Bony calvarium appears intact.

Sinuses/Orbits: Visualized paranasal sinuses are clear. Orbits
appear symmetric bilaterally.

Other: Mastoid air cells are clear.
IMPRESSION: Study within normal limits.

## 2019-11-27 NOTE — ED Triage Notes (Signed)
Pt started having Headaches when working out at high intensity 8 days ago. Happened 3 days in a row.  She has not worked out again for several days.  Also had episode 2 years ago when she got a severe HA after orgasm.  That happened to her again this weekend.  HA was better yesterday but is back this morning after straining in the bathroom.

## 2019-11-27 NOTE — ED Provider Notes (Signed)
[facility] EMERGENCY DEPARTMENT Provider Note   CSN: [id number] Arrival date & time: 11/27/19  0935     History Chief Complaint  Patient presents with  . Headache    Erin [name] is a 39 y.o. female presents to emergency department today with chief complaint of progressively worsening headache x8 days.  Patient states she first noticed the headache when she was playing around and attempted to pick up her friend.  She describes the headache as a sudden surge of sharp pain throughout her head.  It lasted only minutes.  She states over the next several days she continued to have mild headaches intermittently throughout the day but after finishing her high intensity workouts every day the headache would be significantly worse, again she describes it has sharp shooting pain.  She also had an episode this weekend when she had  severe headache after having an orgasm.  She states this is happened in the past approximately 2 years ago, but she was never evaluated and has not had any problems with it since.  He has been taking Tylenol and ibuprofen for headaches which she states will completely resolve symptoms.  She has a mild headache right now rating it 4 out of 10 in severity and states it has progressively worsened since onset.  Denies fever, syncope, head trauma, photophobia, phonophobia, UL throbbing, N/V, visual changes, stiff neck, neck pain, rash, or "thunderclap" onset.   History provided by patient with additional history obtained from chart review.     Past Medical History:  Diagnosis Date  . Obesity     There are no problems to display for this patient.   Past Surgical History:  Procedure Laterality Date  . DILATION AND CURETTAGE OF UTERUS    . LAPAROSCOPY       OB History   No obstetric history on file.     No family history on file.  Social History   Tobacco Use  . Smoking status: Former Games developer  . Smokeless tobacco: Never Used  Substance Use Topics  .  Alcohol use: Yes    Comment: occ  . Drug use: Never    Home Medications Prior to Admission medications   Not on File    Allergies    Patient has no known allergies.  Review of Systems   Review of Systems All other systems are reviewed and are negative for acute change except as noted in the HPI.  Physical Exam Updated Vital Signs BP (!) 145/90 (BP Location: Right Arm)   Pulse 71   Temp 98.1 F (36.7 C) (Oral)   Resp 16   Ht 5\' 3"  (1.6 m)   Wt 116.5 kg   LMP 11/16/2019   SpO2 99%   BMI 45.51 kg/m   Physical Exam Vitals and nursing note reviewed.  Constitutional:      General: She is not in acute distress.    Appearance: She is not ill-appearing.  HENT:     Head: Normocephalic and atraumatic.     Comments: No sinus or temporal tenderness.    Right Ear: Tympanic membrane and external ear normal.     Left Ear: Tympanic membrane and external ear normal.     Nose: Nose normal.     Mouth/Throat:     Mouth: Mucous membranes are moist.     Pharynx: Oropharynx is clear.  Eyes:     General: No scleral icterus.       Right eye: No discharge.  Left eye: No discharge.     Extraocular Movements: Extraocular movements intact.     Conjunctiva/sclera: Conjunctivae normal.     Pupils: Pupils are equal, round, and reactive to light.  Neck:     Vascular: No JVD.     Comments: Full ROM intact without spinous process TTP. No bony stepoffs or deformities, no paraspinous muscle TTP or muscle spasms. No rigidity or meningeal signs. No bruising, erythema, or swelling.  Cardiovascular:     Rate and Rhythm: Normal rate and regular rhythm.     Pulses: Normal pulses.          Radial pulses are 2+ on the right side and 2+ on the left side.     Heart sounds: Normal heart sounds.  Pulmonary:     Comments: Lungs clear to auscultation in all fields. Symmetric chest rise. No wheezing, rales, or rhonchi. Abdominal:     Comments: Abdomen is soft, non-distended, and non-tender in all  quadrants. No rigidity, no guarding. No peritoneal signs.  Musculoskeletal:        General: Normal range of motion.     Cervical back: Normal range of motion.  Skin:    General: Skin is warm and dry.     Capillary Refill: Capillary refill takes less than 2 seconds.  Neurological:     Mental Status: She is oriented to person, place, and time.     GCS: GCS eye subscore is 4. GCS verbal subscore is 5. GCS motor subscore is 6.     Comments: Mental Status:  Alert, oriented, thought content appropriate, able to give a coherent history. Speech fluent without evidence of aphasia. Able to follow 2 step commands without difficulty.  Cranial Nerves:  II:  Peripheral visual fields grossly normal, pupils equal, round, reactive to light III,IV, VI: ptosis not present, extra-ocular motions intact bilaterally  V,VII: smile symmetric, facial light touch sensation equal VIII: hearing grossly normal to voice  X: uvula elevates symmetrically  XI: bilateral shoulder shrug symmetric and strong XII: midline tongue extension without fassiculations Motor:  Normal tone. 5/5 in upper and lower extremities bilaterally including strong and equal grip strength and dorsiflexion/plantar flexion Sensory: Pinprick and light touch normal in all extremities.  Deep Tendon Reflexes: 2+ and symmetric in the biceps and patella Cerebellar: normal finger-to-nose with bilateral upper extremities Gait: normal gait and balance CV: distal pulses palpable throughout     Psychiatric:        Behavior: Behavior normal.     ED Results / Procedures / Treatments   Labs (all labs ordered are listed, but only abnormal results are displayed) Labs Reviewed  PREGNANCY, URINE    EKG None  Radiology CT Head Wo Contrast  Result Date: 11/27/2019 CLINICAL DATA:  Headache after heavy exercise EXAM: CT HEAD WITHOUT CONTRAST TECHNIQUE: Contiguous axial images were obtained from the base of the skull through the vertex without  intravenous contrast. COMPARISON:  None. FINDINGS: Brain: Ventricles and sulci are normal in size and configuration. No evident cranial mass, hemorrhage, extra-axial fluid collection, or midline shift. The brain parenchyma appears unremarkable. No evident acute infarct. Vascular: No hyperdense vessel.  No evident vascular calcification. Skull: Bony calvarium appears intact. Sinuses/Orbits: Visualized paranasal sinuses are clear. Orbits appear symmetric bilaterally. Other: Mastoid air cells are clear. IMPRESSION: Study within normal limits. Electronically Signed   By: Lowella Grip III M.D.   On: 11/27/2019 10:41    Procedures Procedures (including critical care time)  Medications Ordered in ED Medications - No  data to display  ED Course  I have reviewed the triage vital signs and the nursing notes.  Pertinent labs & imaging results that were available during my care of the patient were reviewed by me and considered in my medical decision making (see chart for details).    MDM Rules/Calculators/A&P                      Pt declines need for analgesic at this time. Presentation is like pts typical HA and non concerning for [facility], ICH, Meningitis, or temporal arteritis. She has had intermittent headache x 8 days, making SAH less likely. She is very well appearing. Pt is afebrile with no focal neuro deficits, nuchal rigidity, or change in vision. CT head viewed by me shows no acute findings, no bleedings signs of stroke, no aneurysm. Suspect patient's headache could be related to her significantly restricted caloric intake.  Recommend she increase calorie intake especially on days she is going to have strenuous workout. Pregnancy test is negative.  Pt is to follow up with PCP to discuss prophylactic medication. Pt verbalizes understanding and is agreeable with plan to dc. Findings and plan of care discussed with supervising physician Dr. Bernette Mayers.   Portions of this note were generated with Administrator, sports. Dictation errors may occur despite best attempts at proofreading.   Final Clinical Impression(s) / ED Diagnoses Final diagnoses:  Acute nonintractable headache, unspecified headache type    Rx / DC Orders ED Discharge Orders    None       Kathyrn Lass 11/27/19 1137    Pollyann Savoy, MD 11/27/19 [phone]

## 2019-11-27 NOTE — Discharge Instructions (Addendum)
Today you were evaluated at the emergency department for a headache. ° °1. Medications: continue usual home medications °2. Treatment: rest, drink plenty of fluids, if headache persists take 800mmg ibuprofen with caffeine °3. Follow Up: Please followup with your primary doctor in 3 days and neurology within 1 week for discussion of your diagnoses and further evaluation after today's visit; if you do not have a primary care doctor use the resource guide provided to find one; Please return to the ER for double vision, speech difficulty, gait disturbance, persistent vomiting or other concerns. ° °Headache: ° °You are having a headache. No specific cause was found today for your headache. It may have been a migraine or other cause of headache. Stress, anxiety, fatigue, and depression are common triggers for headaches. Your headache today does not appear to be life-threatening or require hospitalization, but often the exact cause of headaches is not determined in the emergency department. Therefore, followup with your doctor is very important to find out what may have caused your headache, and whether or not you need any further diagnostic testing or treatment. Sometimes headaches can appear benign but then more serious symptoms can develop which should prompt an immediate reevaluation by your doctor or the emergency department. ° °Hydration: Have a goal of about a half liter of water every couple hours to stay well hydrated.  ° °Sleep: Please be sure to get plenty of sleep with a goal of 8 hours per night. Having a regular bed time and bedtime routine can help with this. ° °Screens: Reduce the amount of time you are in front of screens.  Take about a 5-10-minute break every hour or every couple hours to give your eyes rest.  Do not use screens in dark rooms.  Glasses with a blue light filter may also help reduce eye fatigue. ° °Stress: Take steps to reduce stress as much as possible.  ° °Seek immediate medical attention  if: ° °You develop possible problems with medications prescribed. °The medications don't resolve your headache, if it recurs, or if you have multiple episodes of vomiting or can't take fluids by mouth °You have a change from the usual headache. °If you developed a sudden severe headache or confusion, become poorly responsive or faint, developed a fever above 100.4 or problems breathing, have a change in speech, vision, swallowing or understanding, or developed new weakness, numbness, tingling, incoordination or have a seizure. ° ° ° °

## 2020-08-24 NOTE — L&D Delivery Note (Signed)
Delivery Note At 3:09 PM a viable and healthy female was delivered via Vaginal, Spontaneous (Presentation: Right Occiput Anterior).  APGAR: 9, 9; weight pending .   Placenta status: Spontaneous, Intact.  Not sentCord: 3 vessels with the following complications: None.  Cord pH: n/a  Anesthesia: Epidural Episiotomy: None Lacerations:  right Vaginal sulcus, subclitoral laceration Suture Repair: 3.0 chromic Est. Blood Loss (mL): 350  Mom to postpartum.  Baby to Couplet care / Skin to Skin.  Saavi Mceachron A Hilberto Burzynski 03/27/2021, 4:15 PM

## 2020-09-02 LAB — OB RESULTS CONSOLE RUBELLA ANTIBODY, IGM: Rubella: IMMUNE

## 2020-09-02 LAB — OB RESULTS CONSOLE RPR: RPR: NONREACTIVE

## 2020-09-02 LAB — OB RESULTS CONSOLE HIV ANTIBODY (ROUTINE TESTING): HIV: NONREACTIVE

## 2020-09-02 LAB — OB RESULTS CONSOLE HEPATITIS B SURFACE ANTIGEN: Hepatitis B Surface Ag: NEGATIVE

## 2020-09-02 LAB — OB RESULTS CONSOLE ANTIBODY SCREEN: Antibody Screen: NEGATIVE

## 2020-09-02 LAB — OB RESULTS CONSOLE ABO/RH: RH Type: POSITIVE

## 2020-11-01 ENCOUNTER — Other Ambulatory Visit: Payer: Self-pay | Admitting: Obstetrics and Gynecology

## 2020-11-01 DIAGNOSIS — O09529 Supervision of elderly multigravida, unspecified trimester: Secondary | ICD-10-CM

## 2020-11-04 ENCOUNTER — Other Ambulatory Visit: Payer: Self-pay | Admitting: Obstetrics and Gynecology

## 2020-11-04 DIAGNOSIS — O09529 Supervision of elderly multigravida, unspecified trimester: Secondary | ICD-10-CM

## 2020-11-06 ENCOUNTER — Telehealth: Payer: Self-pay

## 2020-11-06 NOTE — Telephone Encounter (Signed)
Mar/LM for patient requesting c/b to r/s. Appointment scheduled in radiology but needs to be scheduled in MFM.

## 2020-11-09 ENCOUNTER — Emergency Department (HOSPITAL_BASED_OUTPATIENT_CLINIC_OR_DEPARTMENT_OTHER)
Admission: EM | Admit: 2020-11-09 | Discharge: 2020-11-09 | Disposition: A | Discharge status: Home / Self Care | Payer: BC Managed Care – PPO | Attending: Emergency Medicine | Admitting: Emergency Medicine

## 2020-11-09 ENCOUNTER — Other Ambulatory Visit: Payer: Self-pay

## 2020-11-09 ENCOUNTER — Encounter (HOSPITAL_BASED_OUTPATIENT_CLINIC_OR_DEPARTMENT_OTHER): Payer: Self-pay | Admitting: Emergency Medicine

## 2020-11-09 DIAGNOSIS — Z87891 Personal history of nicotine dependence: Secondary | ICD-10-CM | POA: Diagnosis not present

## 2020-11-09 DIAGNOSIS — Z3A20 20 weeks gestation of pregnancy: Secondary | ICD-10-CM | POA: Insufficient documentation

## 2020-11-09 DIAGNOSIS — O368121 Decreased fetal movements, second trimester, fetus 1: Secondary | ICD-10-CM | POA: Insufficient documentation

## 2020-11-09 DIAGNOSIS — O368120 Decreased fetal movements, second trimester, not applicable or unspecified: Secondary | ICD-10-CM

## 2020-11-09 NOTE — ED Provider Notes (Signed)
[facility] EMERGENCY DEPARTMENT Provider Note   CSN: [id number] Arrival date & time: 11/09/20  0247     History Chief Complaint  Patient presents with  . Decreased Fetal Movement    Erin [name] is a 40 y.o. female.  Patient is a 40 year old female G3 P0-0-2-0 at [redacted] weeks gestation.  Patient presents today with sensation of decreased fetal movement.  She noticed this yesterday afternoon.  She has attempted to stimulate the baby, however has not felt it move.  She is concerned because she has had 2 prior miscarriages.  Patient has been followed by OB and has had several ultrasounds with documented IUP.  She denies to me she is experiencing any abdominal pain, back pain, contractions, bleeding, or discharge.  She has no other complaints.  The history is provided by the patient.       Past Medical History:  Diagnosis Date  . Obesity     There are no problems to display for this patient.   Past Surgical History:  Procedure Laterality Date  . DILATION AND CURETTAGE OF UTERUS    . LAPAROSCOPY       OB History    Gravida  1   Para      Term      Preterm      AB      Living        SAB      IAB      Ectopic      Multiple      Live Births              No family history on file.  Social History   Tobacco Use  . Smoking status: Former Games developer  . Smokeless tobacco: Never Used  Substance Use Topics  . Alcohol use: Yes    Comment: occ  . Drug use: Never    Home Medications Prior to Admission medications   Not on File    Allergies    Patient has no known allergies.  Review of Systems   Review of Systems  All other systems reviewed and are negative.   Physical Exam Updated Vital Signs BP (!) 148/95 (BP Location: Right Arm)   Pulse 100   Temp 98.4 F (36.9 C) (Oral)   Resp 18   Ht 5\' 3"  (1.6 m)   Wt 99.8 kg   LMP 11/16/2019   SpO2 99%   BMI 38.97 kg/m   Physical Exam Vitals and nursing note reviewed.  Constitutional:       General: She is not in acute distress.    Appearance: Normal appearance. She is not ill-appearing.  HENT:     Head: Normocephalic and atraumatic.  Pulmonary:     Effort: Pulmonary effort is normal.  Abdominal:     Comments: Abdomen with fundal height consistent with stated gestational age.  There is no tenderness.  Skin:    General: Skin is warm and dry.  Neurological:     Mental Status: She is alert.     ED Results / Procedures / Treatments   Labs (all labs ordered are listed, but only abnormal results are displayed) Labs Reviewed - No data to display  EKG None  Radiology No results found.  Procedures Procedures   Medications Ordered in ED Medications - No data to display  ED Course  I have reviewed the triage vital signs and the nursing notes.  Pertinent labs & imaging results that were available during my care  of the patient were reviewed by me and considered in my medical decision making (see chart for details).    MDM Rules/Calculators/A&P  Bedside ultrasound performed revealing a fetal heartbeat and good fetal movement.  I am unable to identify any abnormalities.  Final Clinical Impression(s) / ED Diagnoses Final diagnoses:  None    Rx / DC Orders ED Discharge Orders    None       Geoffery Lyons, MD 11/09/20 [phone]

## 2020-11-09 NOTE — Discharge Instructions (Addendum)
Follow-up with your obstetrician if you experience any further difficulties.

## 2020-11-09 NOTE — ED Triage Notes (Addendum)
Pt reports decreased fetal movement since yesterday. Pt is [redacted] weeks pregnant. Denies pain and vaginal bleeding.

## 2020-11-14 ENCOUNTER — Ambulatory Visit: Payer: BC Managed Care – PPO

## 2020-11-19 ENCOUNTER — Encounter: Payer: Self-pay | Admitting: *Deleted

## 2020-11-21 ENCOUNTER — Other Ambulatory Visit: Payer: Self-pay | Admitting: *Deleted

## 2020-11-21 ENCOUNTER — Ambulatory Visit: Payer: BC Managed Care – PPO | Attending: Obstetrics and Gynecology

## 2020-11-21 ENCOUNTER — Other Ambulatory Visit: Payer: Self-pay

## 2020-11-21 ENCOUNTER — Encounter: Payer: Self-pay | Admitting: *Deleted

## 2020-11-21 ENCOUNTER — Other Ambulatory Visit: Payer: Self-pay | Admitting: Obstetrics and Gynecology

## 2020-11-21 ENCOUNTER — Ambulatory Visit: Payer: BC Managed Care – PPO

## 2020-11-21 ENCOUNTER — Ambulatory Visit: Payer: BC Managed Care – PPO | Admitting: *Deleted

## 2020-11-21 VITALS — BP 120/68 | HR 89

## 2020-11-21 DIAGNOSIS — O10019 Pre-existing essential hypertension complicating pregnancy, unspecified trimester: Secondary | ICD-10-CM

## 2020-11-21 DIAGNOSIS — Z3A21 21 weeks gestation of pregnancy: Secondary | ICD-10-CM | POA: Diagnosis not present

## 2020-11-21 DIAGNOSIS — O99212 Obesity complicating pregnancy, second trimester: Secondary | ICD-10-CM | POA: Diagnosis not present

## 2020-11-21 DIAGNOSIS — O09522 Supervision of elderly multigravida, second trimester: Secondary | ICD-10-CM | POA: Diagnosis not present

## 2020-11-21 DIAGNOSIS — O09529 Supervision of elderly multigravida, unspecified trimester: Secondary | ICD-10-CM

## 2020-11-21 DIAGNOSIS — Z363 Encounter for antenatal screening for malformations: Secondary | ICD-10-CM | POA: Diagnosis not present

## 2020-11-21 DIAGNOSIS — O10912 Unspecified pre-existing hypertension complicating pregnancy, second trimester: Secondary | ICD-10-CM

## 2020-11-21 DIAGNOSIS — O2690 Pregnancy related conditions, unspecified, unspecified trimester: Secondary | ICD-10-CM

## 2020-11-21 IMAGING — US US MFM OB DETAIL+14 WK
1 series · 13 of 28 positions shown · non-contrast
Comparison: none

[Series 1: us mfm ob detail+14 wk · 164 acquisitions, 13 frames shown]
[im 7/164]
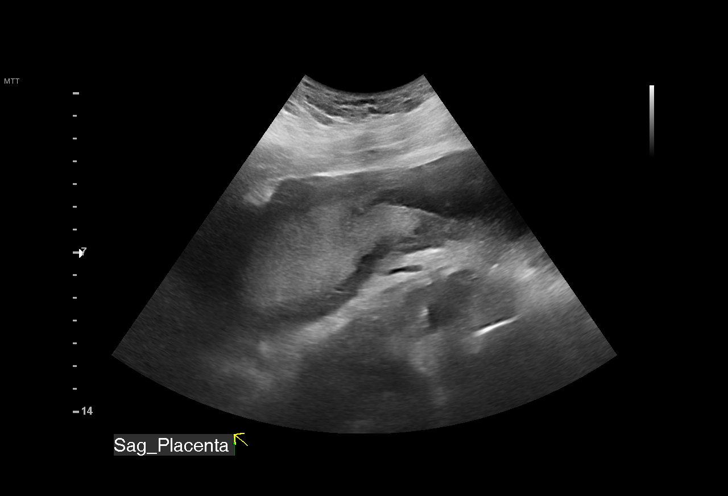
[im 19/164]
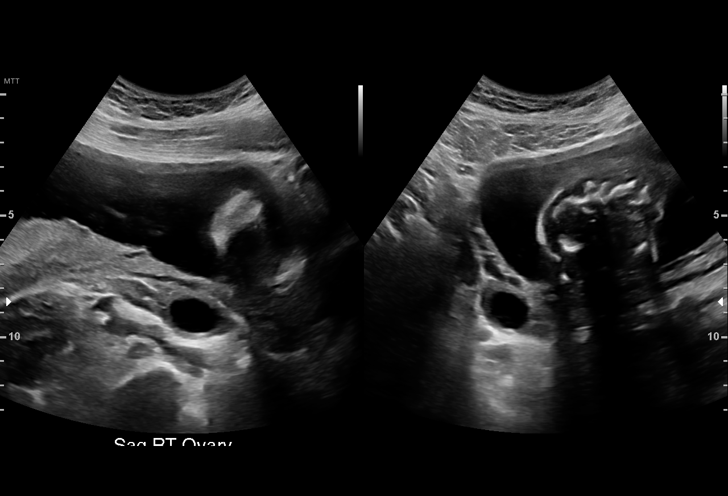
[im 31/164]
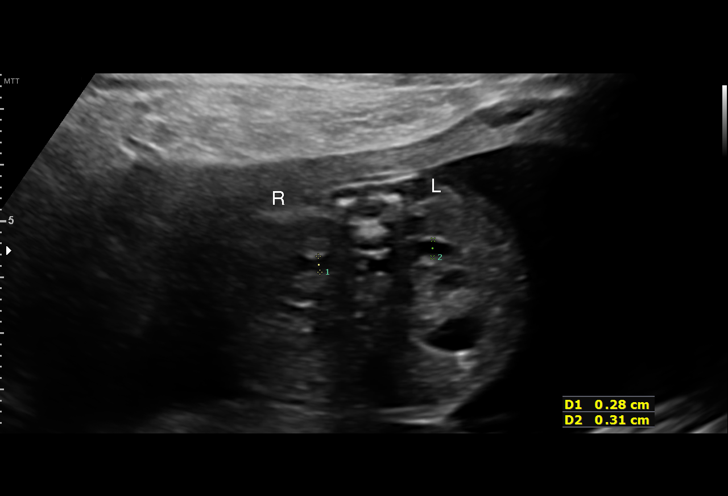
[im 43/164]
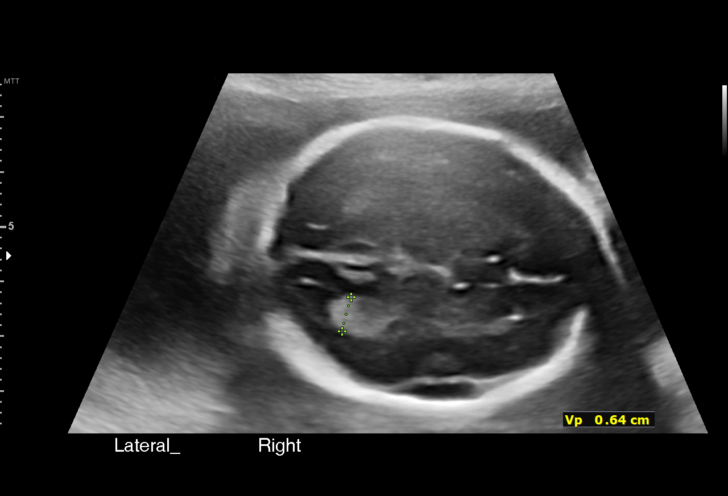
[im 55/164]
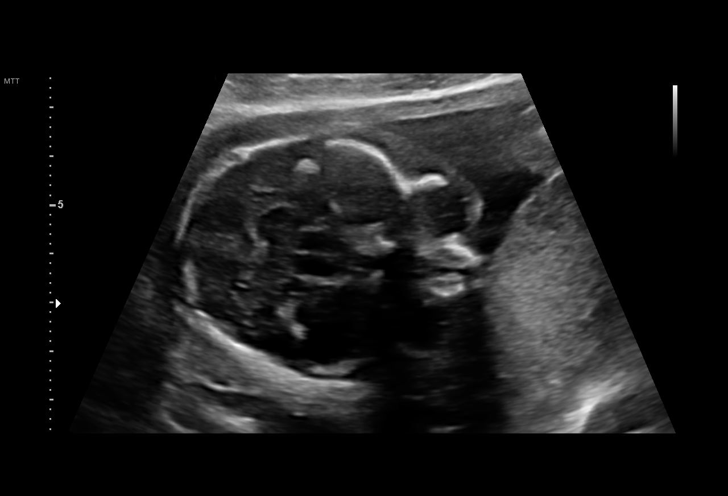
[im 67/164]
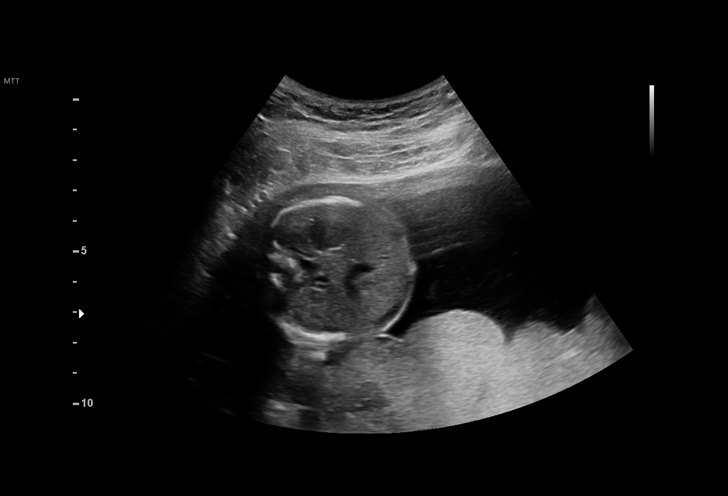
[im 85/164]
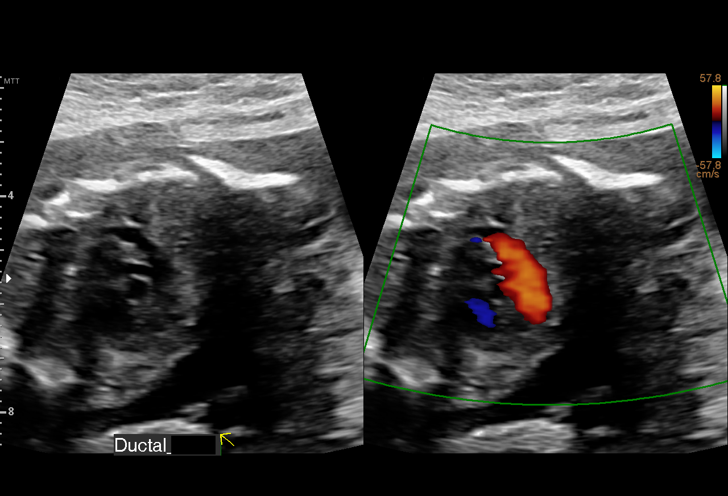
[im 97/164]
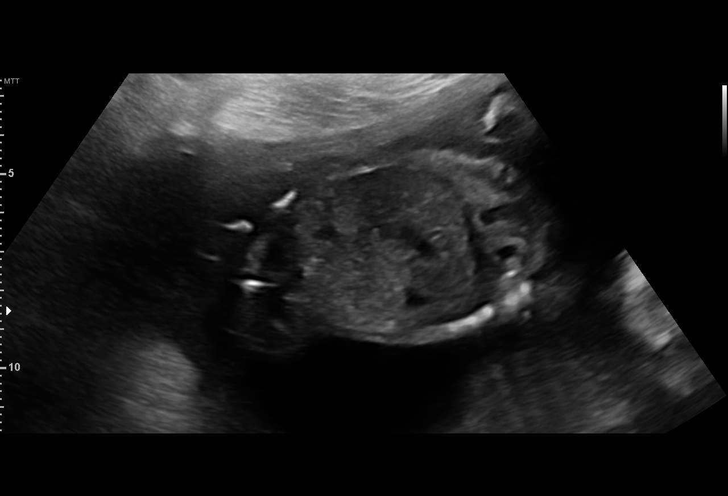
[im 109/164]
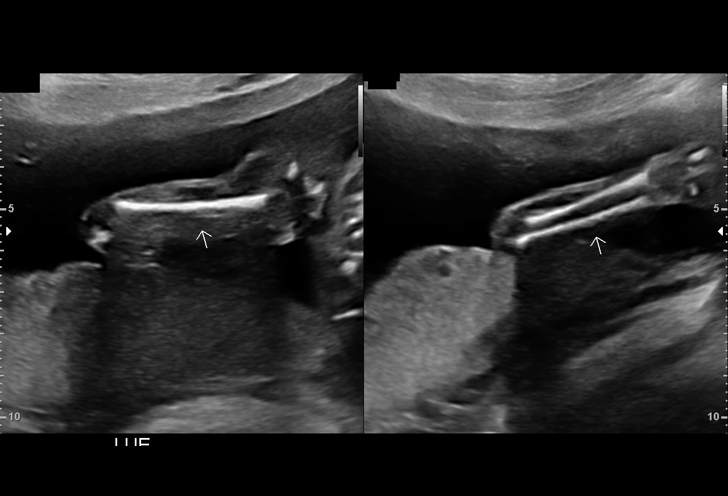
[im 121/164]
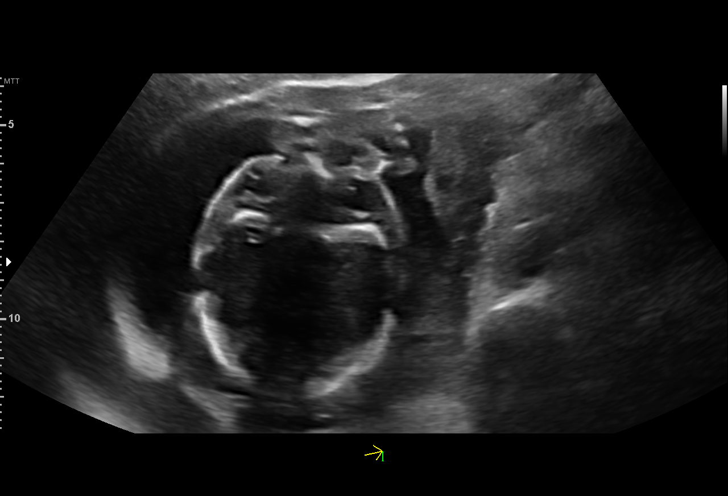
[im 133/164]
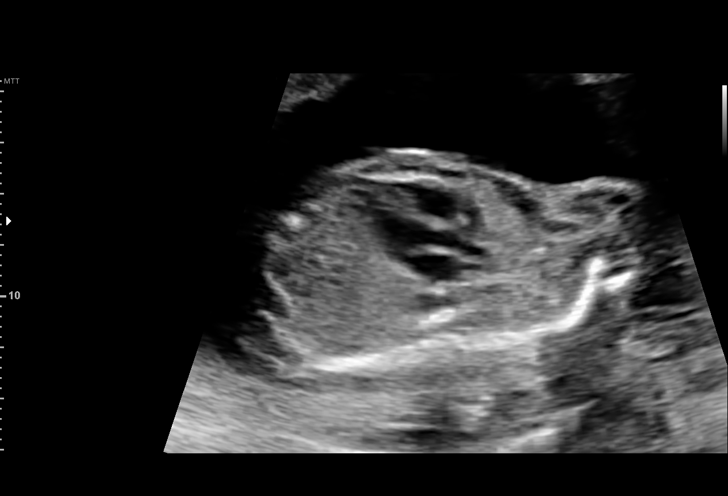
[im 145/164]
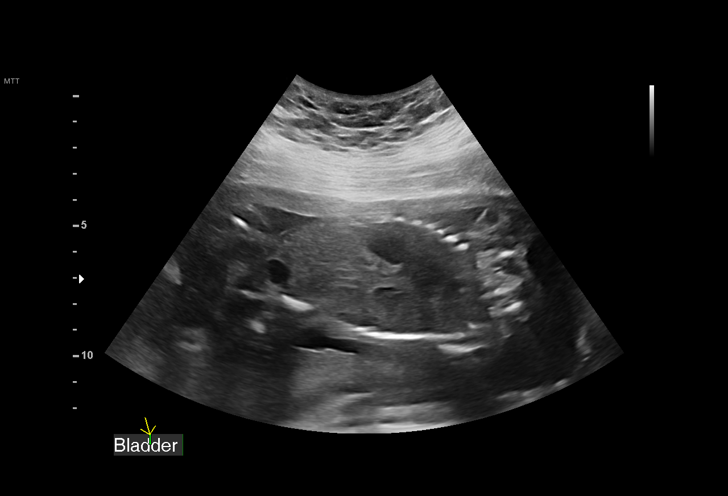
[im 157/164]
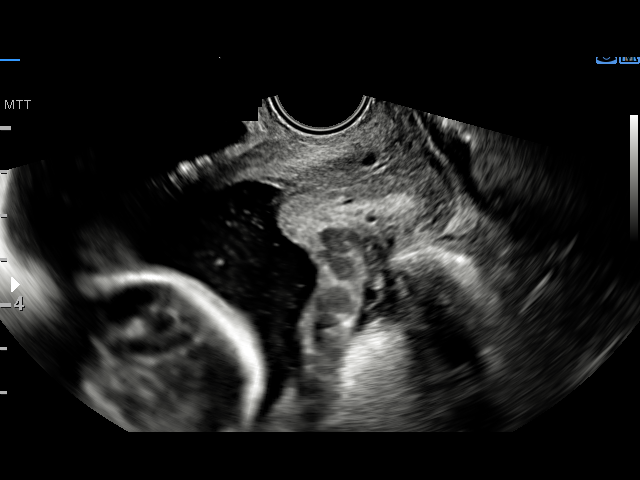

[13 of 28 positions shown; findings below may reference images not displayed]

OB/GYN

                                                      THA PHOT
 2  US MFM OB TRANSVAGINAL                76817.2     THA PHOT
                                                      THA PHOT

Indications

 21 weeks gestation of pregnancy
 Obesity complicating pregnancy, second         [E3]
 trimester (BMI 38)
 Advanced maternal age multigravida 35+,        [E3]
 second trimester
 LR NIPS
 Medical complication of pregnancy (Hx          [E3]
 PCOS)
 Hypertension - Chronic/Pre-existing            [E3]
 Encounter for antenatal screening for          [E3]
 malformations
Fetal Evaluation

 Num Of Fetuses:         1
 Fetal Heart Rate(bpm):  150
 Cardiac Activity:       Observed
 Presentation:           Cephalic
 Placenta:               Posterior
 P. Cord Insertion:      Visualized, central
 Amniotic Fluid
 AFI FV:      Within normal limits

                             Largest Pocket(cm)

Biometry

 BPD:      50.9  mm     G. Age:  21w 3d         42  %    CI:        75.19   %    70 - 86
                                                         FL/HC:      19.3   %    18.4 -
 HC:      186.2  mm     G. Age:  21w 0d         16  %    HC/AC:      1.12        1.06 -
 AC:      165.8  mm     G. Age:  21w 4d         44  %    FL/BPD:     70.5   %    71 - 87
 FL:       35.9  mm     G. Age:  21w 3d         33  %    FL/AC:      21.7   %    20 - 24
 HUM:      36.1  mm     G. Age:  22w 4d         72  %
 CER:      21.5  mm     G. Age:  20w 2d         26  %
 LV:        6.4  mm
 CM:        4.2  mm
 Est. FW:     425  gm    0 lb 15 oz      38  %
OB History

 Blood Type:   B+
 Gravidity:    3         Term:   0        Prem:   0        SAB:   2
 TOP:          0       Ectopic:  0        Living: 0
Gestational Age

 LMP:           21w 4d        Date:  [DATE]                 EDD:   [DATE]
 U/S Today:     21w 3d                                        EDD:   [DATE]
 Best:          21w 4d     Det. By:  LMP  ([DATE])          EDD:   [DATE]
Anatomy

 Cranium:               Appears normal         LVOT:                   Appears normal
 Cavum:                 Appears normal         Aortic Arch:            Appears normal
 Ventricles:            Appears normal         Ductal Arch:            Appears normal
 Choroid Plexus:        Appears normal         Diaphragm:              Appears normal
 Cerebellum:            Appears normal         Stomach:                Appears normal, left
                                                                       sided
 Posterior Fossa:       Appears normal         Abdomen:                Appears normal
 Nuchal Fold:           Not applicable (>20    Abdominal Wall:         Appears nml (cord
                        wks GA)                                        insert, abd wall)
 Face:                  Appears normal         Cord Vessels:           Appears normal (3
                        (orbits and profile)                           vessel cord)
 Lips:                  Appears normal         Kidneys:                Appear normal
 Palate:                Appears normal         Bladder:                Appears normal
 Thoracic:              Appears normal         Spine:                  Appears normal
 Heart:                 Appears normal         Upper Extremities:      Appears normal
                        (4CH, axis, and
                        situs)
 RVOT:                  Appears normal         Lower Extremities:      Appears normal

 Other:  Fetus appears to be a male. VC, 3VV, Nasal bone and Lenses
         visualized. Heels/feet and open hands/5th digits visualized.
         Technically difficult due to maternal habitus and fetal position.
Cervix Uterus Adnexa
 Cervix
 Length:            2.7  cm.
 Measured transvaginally.

 Uterus
 No abnormality visualized.

 Right Ovary
 Within normal limits.

 Left Ovary
 Within normal limits.

 Cul De Sac
 No free fluid seen.

 Adnexa
 No abnormality visualized.
Impression

 G3 P0.  Patient is here for fetal anatomy scan.  She has
 chronic hypertension that is well controlled without
 antihypertensives.  Blood pressure today at her office is
 120s/68 mmHg.
 On cell-free fetal DNA screening, the risks of aneuploidies
 were not increased.
 We performed a fetal anatomy scan. No markers of
 aneuploidies or fetal structural defects are seen. Fetal
 biometry is consistent with her previously-established dates.
 Amniotic fluid is normal and good fetal activity is seen.
 Patient understands the limitations of ultrasound in detecting
 fetal anomalies.

 Because of suspicion of cervical shortening, we performed
 transvaginal ultrasound.  The cervix measures 2.7 cm, which
 is within normal range.  No shortening or funneling is seen on
 transfundal pressure.
 I explained the findings and reassured her.
Recommendations

 -An appointment was made for her to return in 2 weeks for
 transvaginal ultrasound to evaluate cervical length.
 -Fetal growth assessment in 4 weeks (chronic hypertension).
                 THA PHOT

## 2020-12-03 ENCOUNTER — Encounter: Payer: Self-pay | Admitting: *Deleted

## 2020-12-03 ENCOUNTER — Ambulatory Visit: Payer: BC Managed Care – PPO | Attending: Obstetrics and Gynecology

## 2020-12-03 ENCOUNTER — Other Ambulatory Visit: Payer: Self-pay

## 2020-12-03 ENCOUNTER — Ambulatory Visit: Payer: BC Managed Care – PPO | Admitting: *Deleted

## 2020-12-03 VITALS — BP 118/75 | HR 88

## 2020-12-03 DIAGNOSIS — E282 Polycystic ovarian syndrome: Secondary | ICD-10-CM

## 2020-12-03 DIAGNOSIS — O09529 Supervision of elderly multigravida, unspecified trimester: Secondary | ICD-10-CM | POA: Diagnosis present

## 2020-12-03 DIAGNOSIS — O3482 Maternal care for other abnormalities of pelvic organs, second trimester: Secondary | ICD-10-CM

## 2020-12-03 DIAGNOSIS — O99212 Obesity complicating pregnancy, second trimester: Secondary | ICD-10-CM

## 2020-12-03 DIAGNOSIS — E669 Obesity, unspecified: Secondary | ICD-10-CM

## 2020-12-03 DIAGNOSIS — O09512 Supervision of elderly primigravida, second trimester: Secondary | ICD-10-CM | POA: Insufficient documentation

## 2020-12-03 DIAGNOSIS — O09522 Supervision of elderly multigravida, second trimester: Secondary | ICD-10-CM | POA: Diagnosis not present

## 2020-12-03 DIAGNOSIS — Z3A23 23 weeks gestation of pregnancy: Secondary | ICD-10-CM

## 2020-12-03 DIAGNOSIS — Z3686 Encounter for antenatal screening for cervical length: Secondary | ICD-10-CM

## 2020-12-03 DIAGNOSIS — O26872 Cervical shortening, second trimester: Secondary | ICD-10-CM

## 2020-12-03 DIAGNOSIS — O10012 Pre-existing essential hypertension complicating pregnancy, second trimester: Secondary | ICD-10-CM

## 2020-12-03 IMAGING — US US MFM OB TRANSVAGINAL
1 series · 15 of 23 positions shown · non-contrast
Comparison: none

[Series 1: us mfm ob transvaginal · 23 acquisitions, 15 frames shown]
[im 1/23]
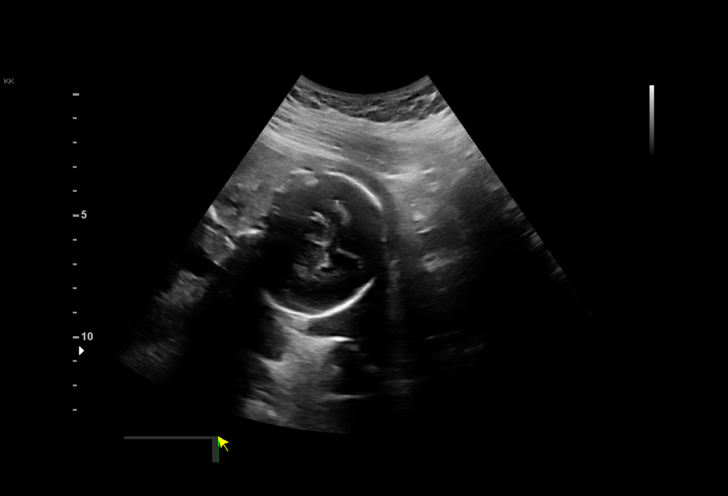
[im 3/23]
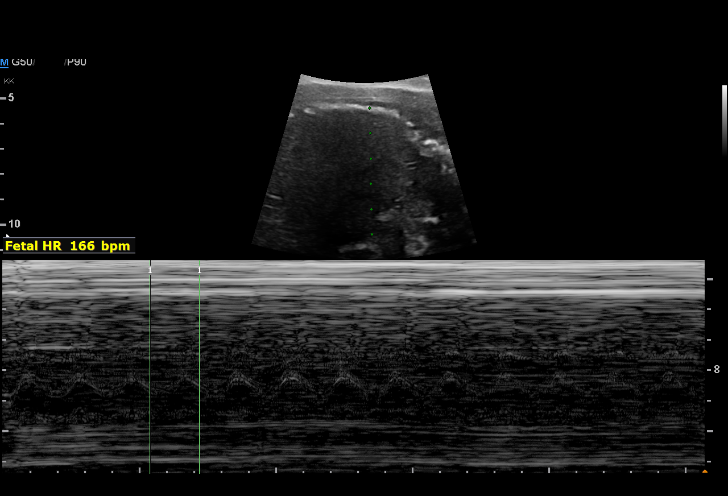
[im 4/23]
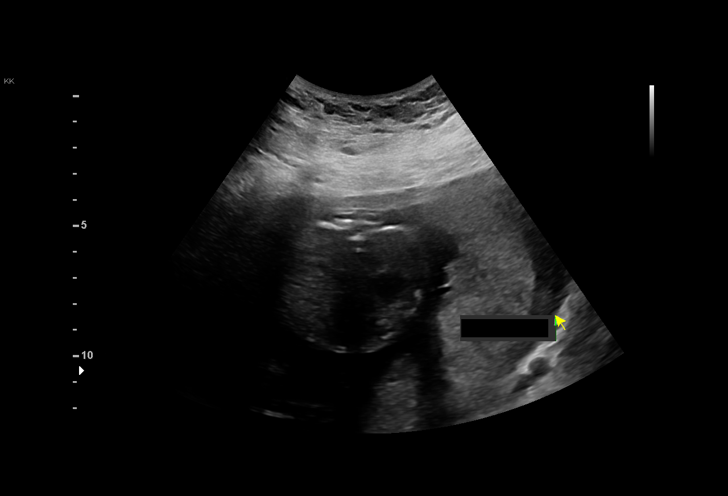
[im 6/23]
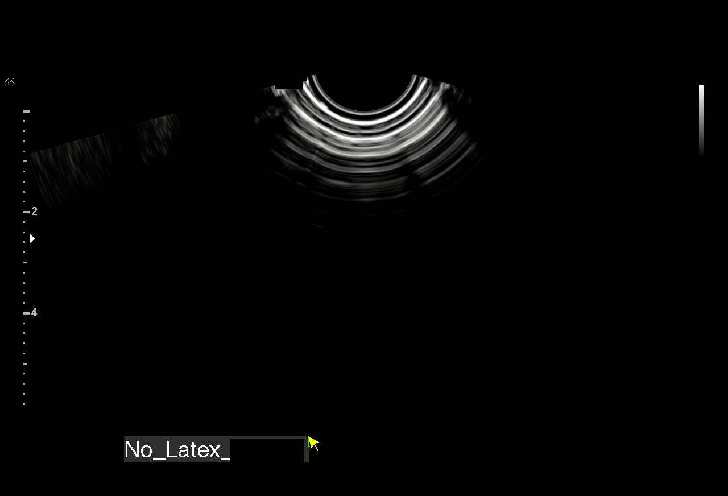
[im 7/23]
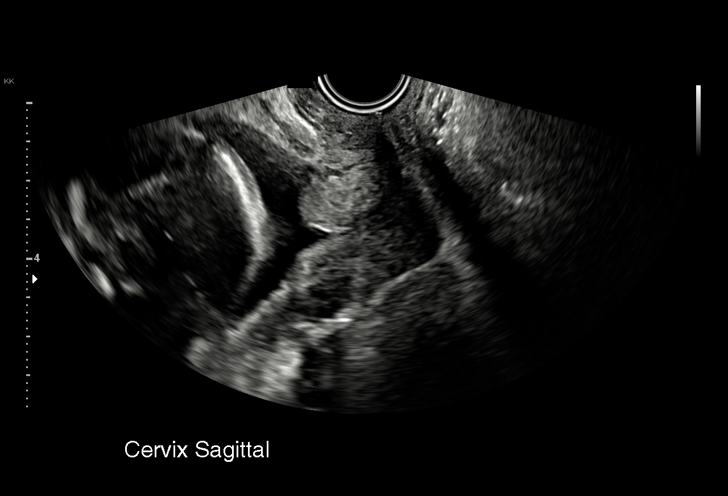
[im 9/23]
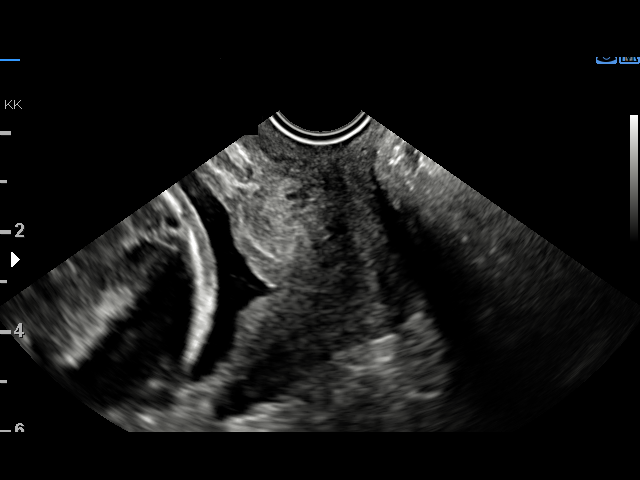
[im 10/23]
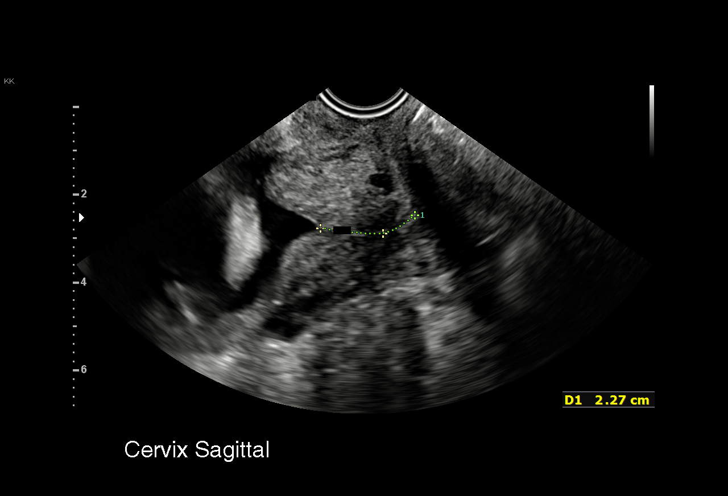
[im 12/23]
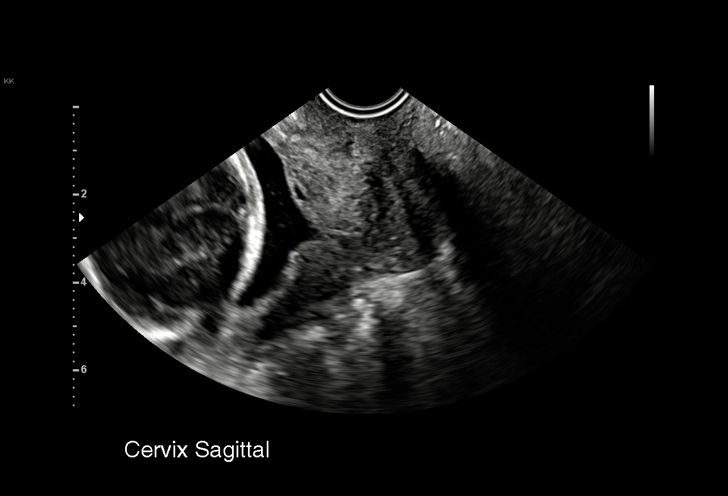
[im 14/23]
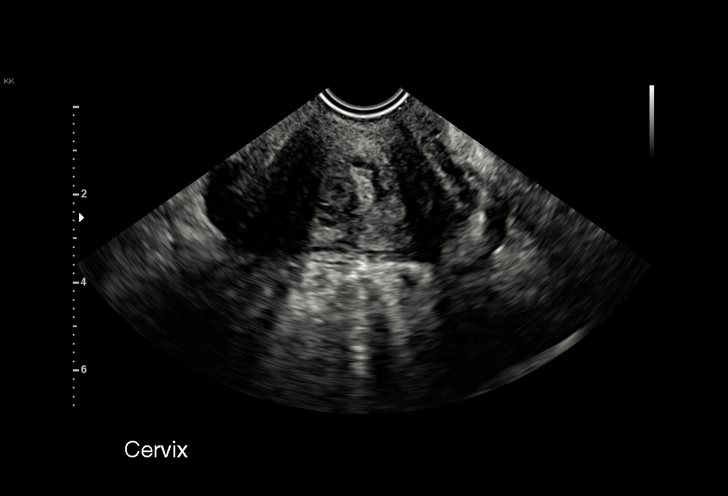
[im 15/23]
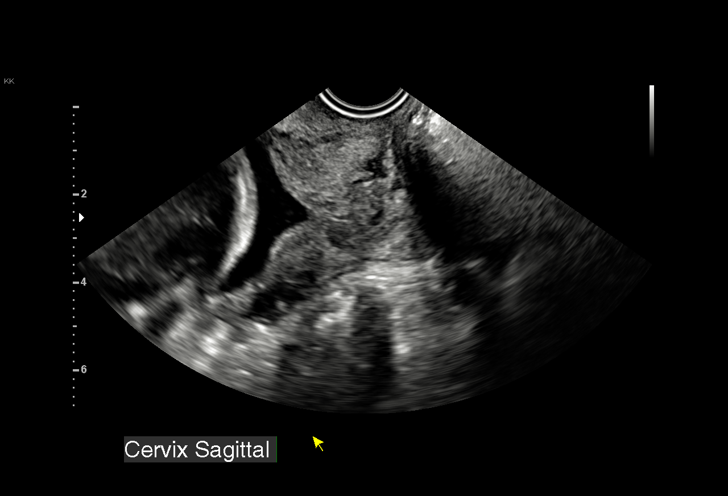
[im 17/23]
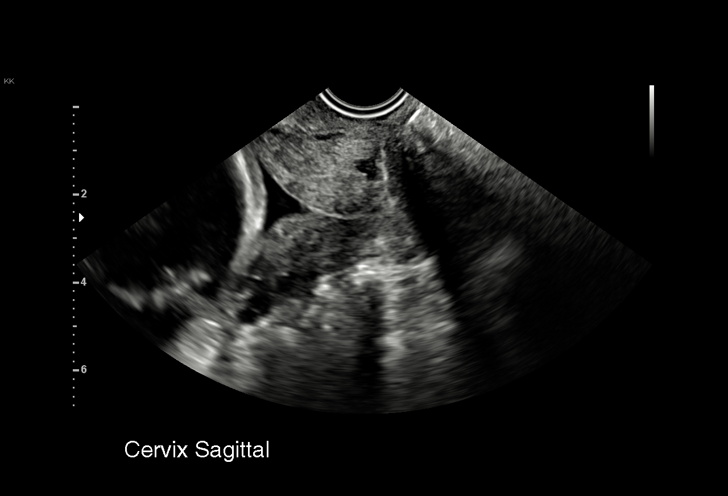
[im 18/23]
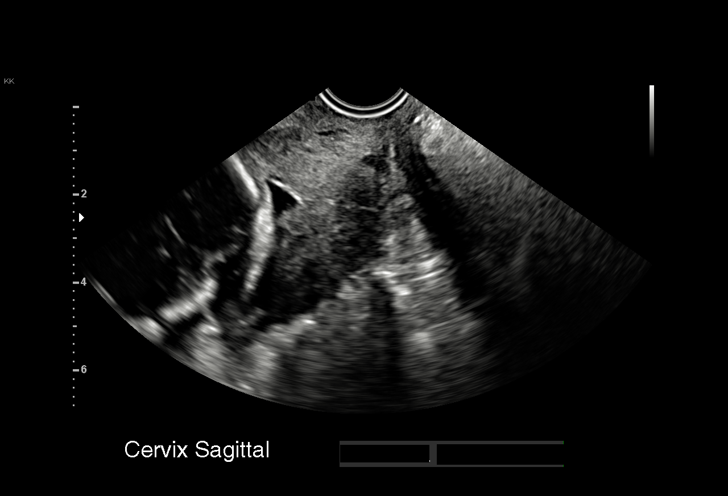
[im 20/23]
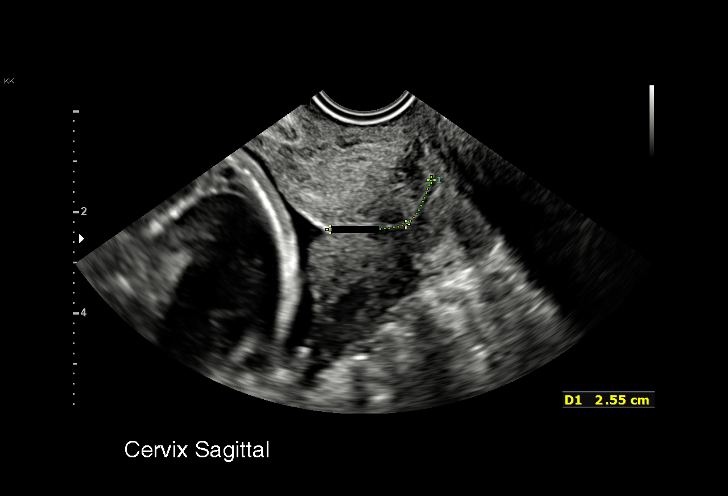
[im 21/23]
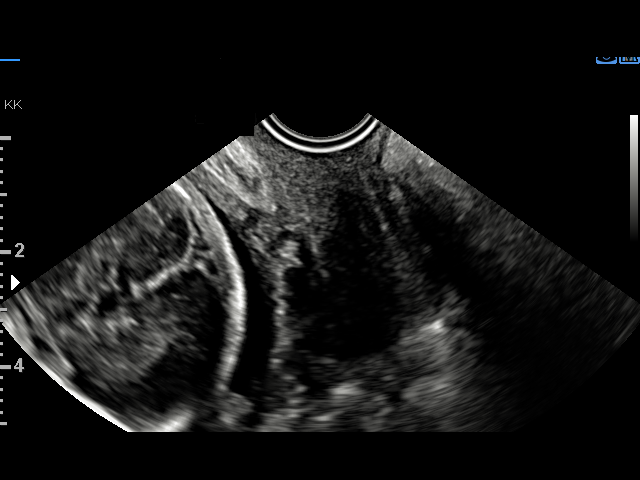
[im 23/23]
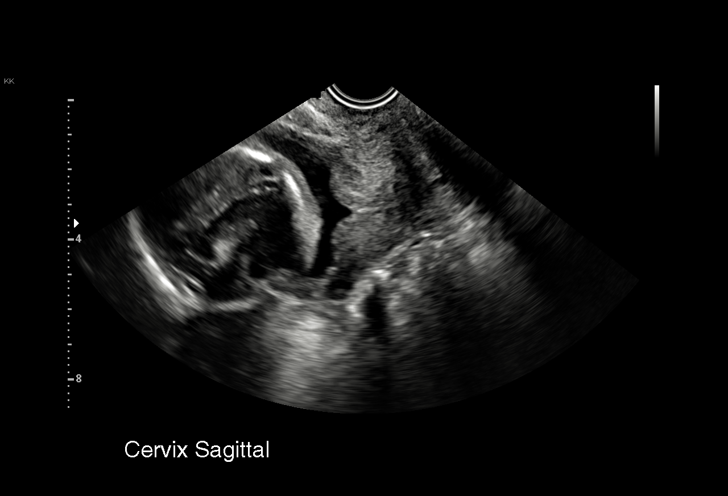

[15 of 23 positions shown; findings below may reference images not displayed]

OB/GYN

Indications

 Encounter for cervical length                  [[place]]
 Cervical shortening, second trimester          [[place]]
 23 weeks gestation of pregnancy
 Obesity complicating pregnancy, second         [[place]]
 trimester (BMI 38)
 Advanced maternal age multigravida 35+,        [[place]]
 second trimester
 LR NIPS(Negative AFP)
 Medical complication of pregnancy (Hx          [[place]]
 PCOS)
 Hypertension - Chronic/Pre-existing            [[place]]
Fetal Evaluation

 Num Of Fetuses:         1
 Fetal Heart Rate(bpm):  166
 Cardiac Activity:       Observed
 Presentation:           Cephalic
OB History

 Blood Type:   B+
 Gravidity:    3         Term:   0        Prem:   0        SAB:   2
 TOP:          0       Ectopic:  0        Living: 0
Gestational Age

 LMP:           23w 2d        Date:  [DATE]                 EDD:   [DATE]
 Best:          23w 2d     Det. By:  LMP  ([DATE])          EDD:   [DATE]
Anatomy

 Stomach:               Appears normal, left   Bladder:                Appears normal
                        sided
Cervix Uterus Adnexa

 Cervix
 Length:            2.6  cm.
 Measured transvaginally.
Impression

 Follow up cervical length
 The cervix is stable 2.6 cm today, there is little dynamic
 change.

 Ms. LETSHEE notes that she has had mild pelvic pressure, but
 there isn't much change from previously.  I counseled her on
 the s/sx of preterm labor.
Recommendations

 Repeat growth in 2 weeks.

## 2020-12-20 ENCOUNTER — Ambulatory Visit: Payer: BC Managed Care – PPO | Admitting: *Deleted

## 2020-12-20 ENCOUNTER — Encounter: Payer: Self-pay | Admitting: *Deleted

## 2020-12-20 ENCOUNTER — Ambulatory Visit: Payer: BC Managed Care – PPO | Attending: Obstetrics and Gynecology

## 2020-12-20 ENCOUNTER — Other Ambulatory Visit: Payer: Self-pay

## 2020-12-20 VITALS — BP 125/77 | HR 79

## 2020-12-20 DIAGNOSIS — I10 Essential (primary) hypertension: Secondary | ICD-10-CM

## 2020-12-20 DIAGNOSIS — O09529 Supervision of elderly multigravida, unspecified trimester: Secondary | ICD-10-CM | POA: Insufficient documentation

## 2020-12-20 IMAGING — US US MFM OB FOLLOW-UP
1 series · 14 of 28 positions shown · non-contrast
Comparison: none

[Series 1: us mfm ob follow-up · 38 acquisitions, 14 frames shown]
[im 2/38]
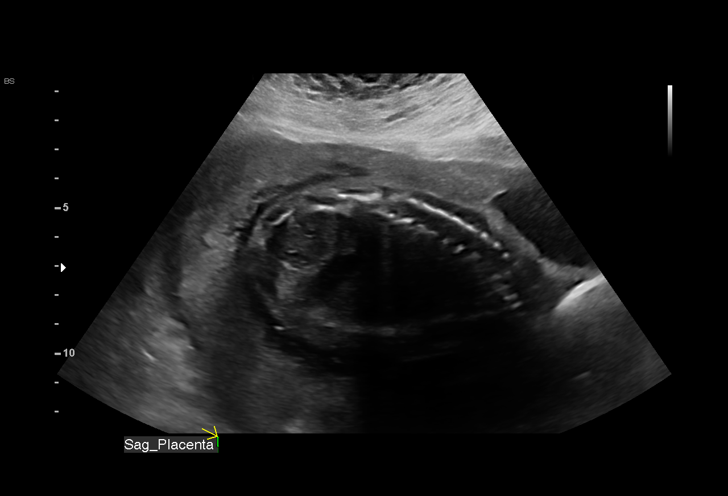
[im 5/38]
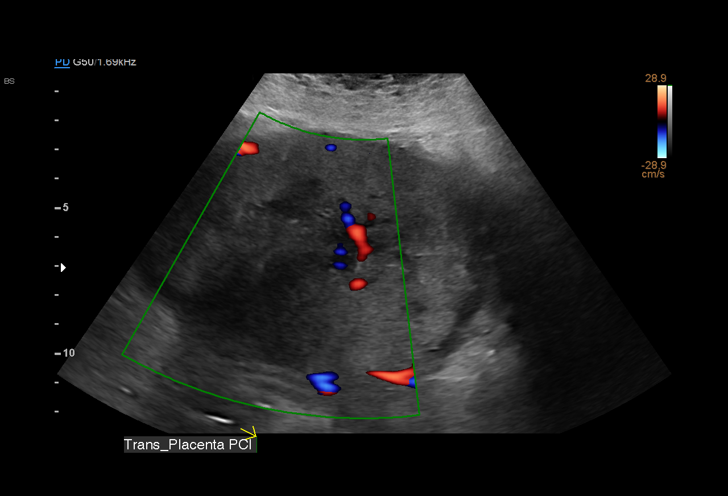
[im 7/38]
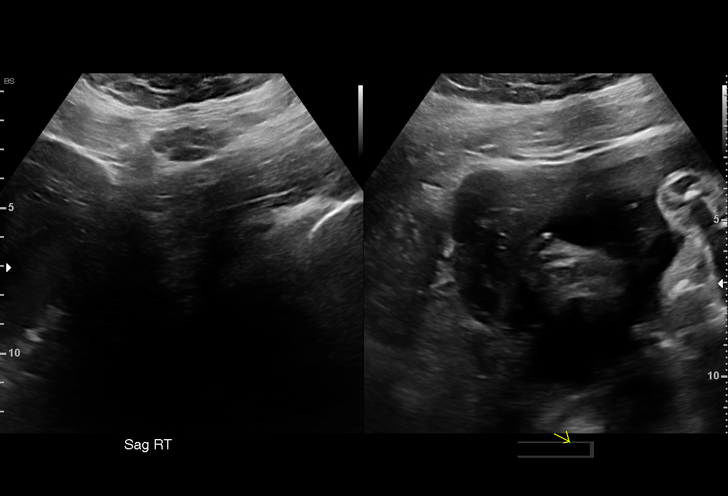
[im 10/38]
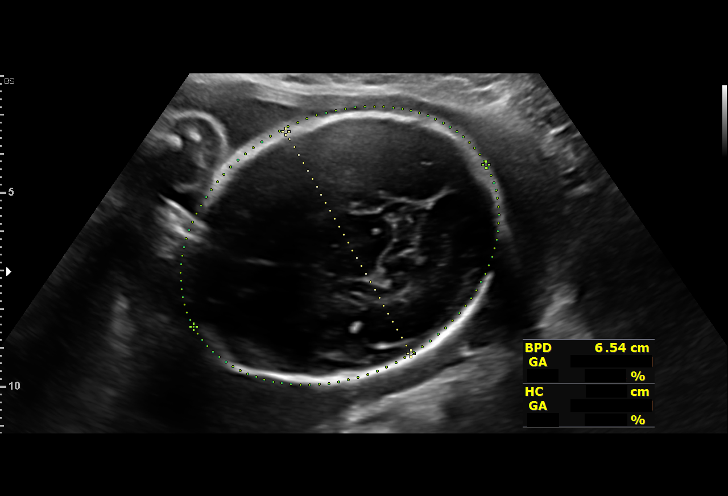
[im 13/38]
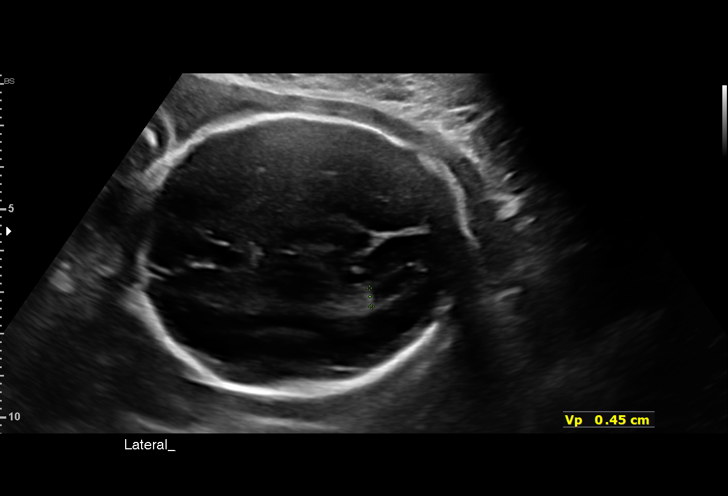
[im 16/38]
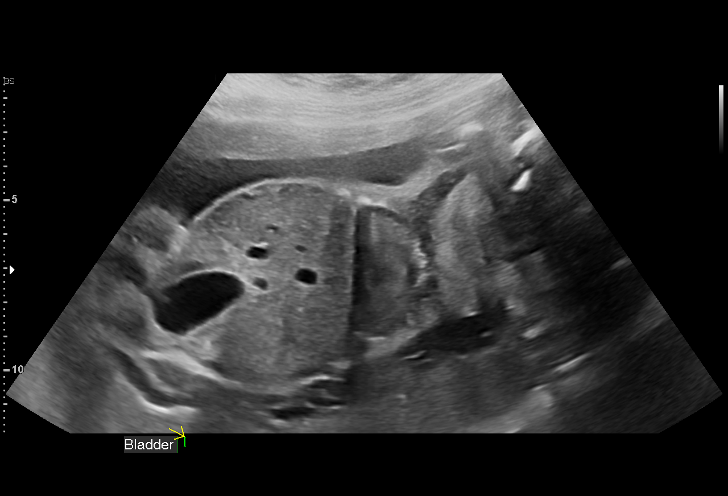
[im 18/38]
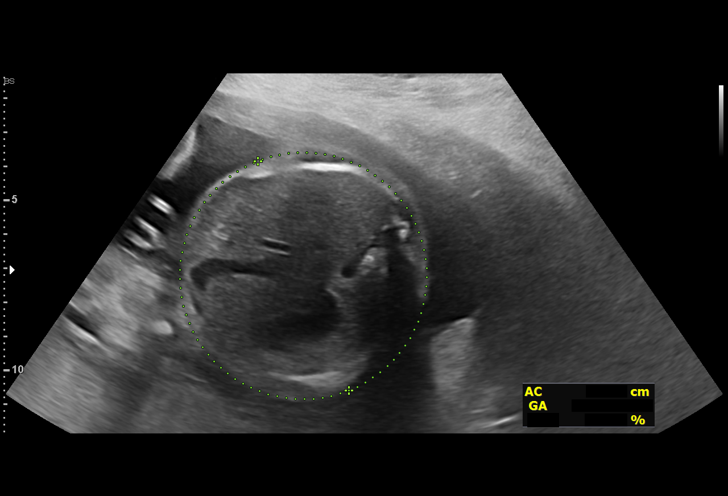
[im 21/38]
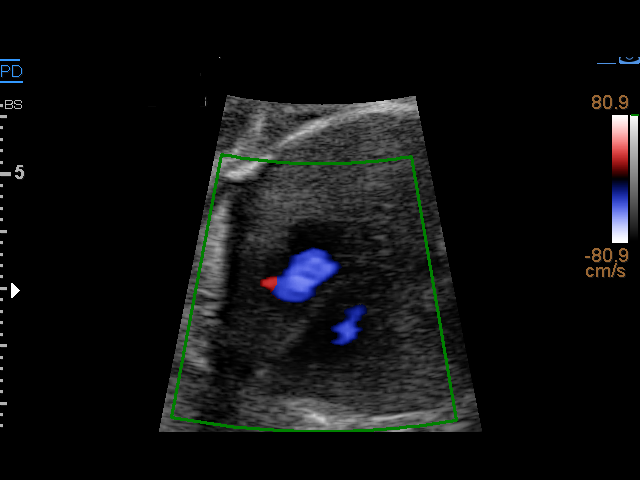
[im 24/38]
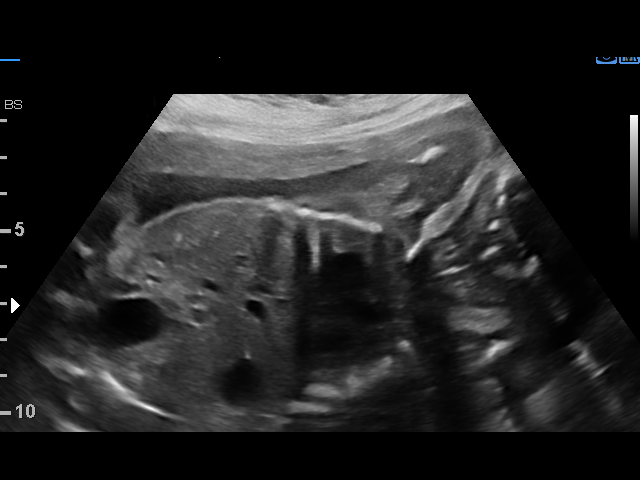
[im 27/38]
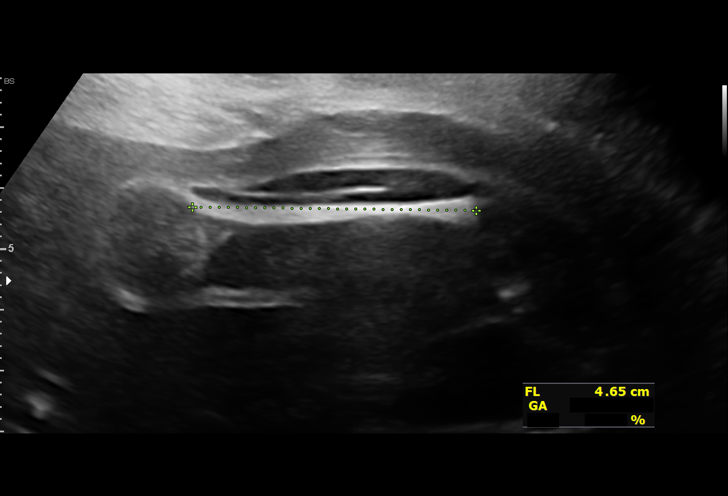
[im 29/38]
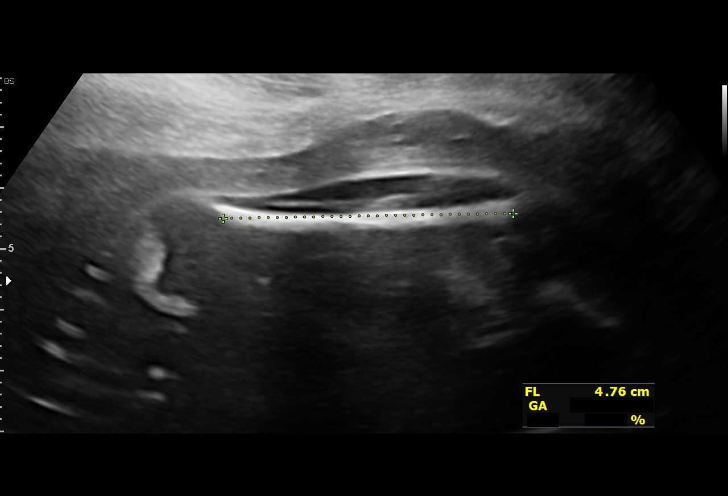
[im 32/38]
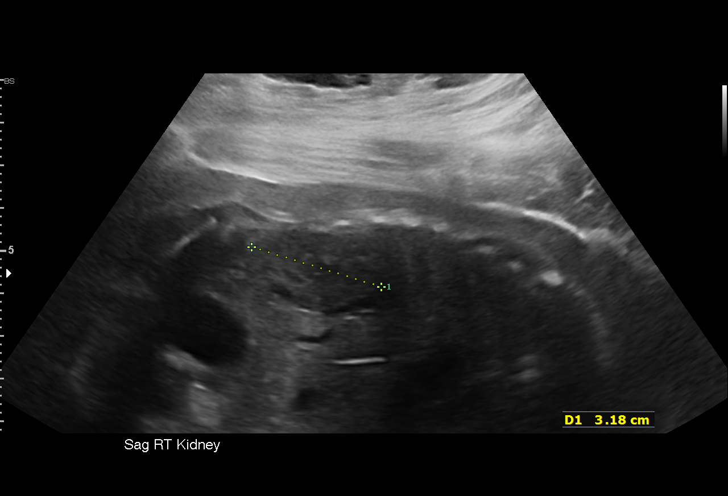
[im 35/38]
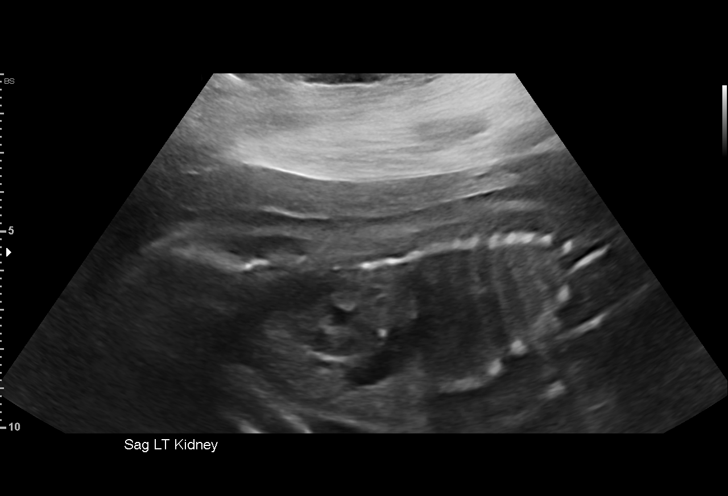
[im 38/38]
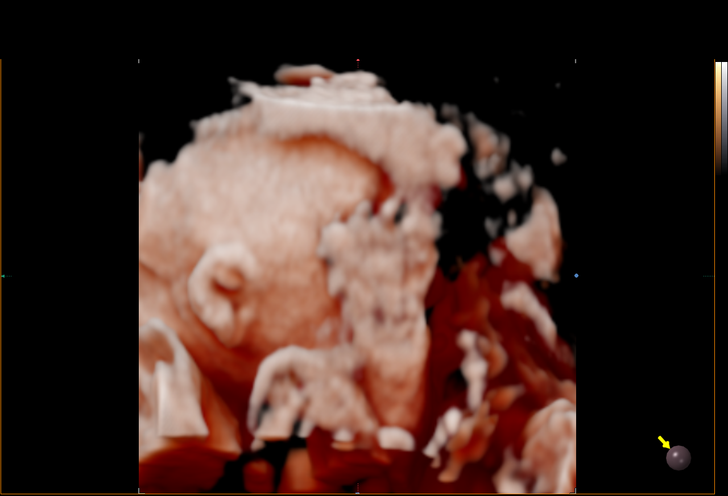

[14 of 28 positions shown; findings below may reference images not displayed]

OB/GYN

Indications

 Hypertension - Chronic/Pre-existing             [03]
 Encounter for cervical length                   [03]
 Cervical shortening, second trimester           [03]
 Obesity complicating pregnancy, second          [03]
 trimester (BMI 38)
 Advanced maternal age multigravida 35+,         [03]
 second trimester
 LR NIPS(Negative AFP)
 Medical complication of pregnancy (Hx           [03]
 PCOS)
 25 weeks gestation of pregnancy
Fetal Evaluation

 Num Of Fetuses:          1
 Fetal Heart Rate(bpm):   131
 Cardiac Activity:        Observed
 Presentation:            Cephalic
 Placenta:                Posterior
 P. Cord Insertion:       Visualized

 Amniotic Fluid
 AFI FV:      Within normal limits

                             Largest Pocket(cm)

Biometry

 BPD:      66.2  mm     G. Age:  26w 5d         74  %    CI:        77.93   %    70 - 86
                                                         FL/HC:       20.1  %    18.6 -
 HC:      237.3  mm     G. Age:  25w 5d         28  %    HC/AC:       1.07       1.04 -
 AC:      222.7  mm     G. Age:  26w 5d         72  %    FL/BPD:      72.1  %    71 - 87
 FL:       47.7  mm     G. Age:  26w 0d         43  %    FL/AC:       21.4  %    20 - 24
 LV:        4.5  mm

 Est. FW:     924   gm     2 lb 1 oz     67  %
OB History

 Blood Type:   B+
 Gravidity:    3         Term:   0        Prem:   0        SAB:   2
 TOP:          0       Ectopic:  0        Living: 0
Gestational Age

 LMP:           25w 5d        Date:  [DATE]                 EDD:   [DATE]
 U/S Today:     26w 2d                                        EDD:   [DATE]
 Best:          25w 5d     Det. By:  LMP  ([DATE])          EDD:   [DATE]
Anatomy

 Cranium:               Appears normal         LVOT:                   Previously seen
 Cavum:                 Previously seen        Aortic Arch:            Previously seen
 Ventricles:            Appears normal         Ductal Arch:            Previously seen
 Choroid Plexus:        Previously seen        Diaphragm:              Appears normal
 Cerebellum:            Previously seen        Stomach:                Appears normal, left
                                                                       sided
 Posterior Fossa:       Previously seen        Abdomen:                Appears normal
 Nuchal Fold:           Not applicable (>20    Abdominal Wall:         Previously seen
                        wks GA)
 Face:                  Orbits and profile     Cord Vessels:           Previously seen
                        previously seen
 Lips:                  Previously seen        Kidneys:                Appear normal
 Palate:                Previously seen        Bladder:                Appears normal
 Thoracic:              Previously seen        Spine:                  Previously seen
 Heart:                 Previously seen        Upper Extremities:      Previously seen
 RVOT:                  Previously seen        Lower Extremities:      Previously seen

 Other:  Fetus appears to be a male. VC, 3VV, Nasal bone and Lenses prev
         visualized. Heels/feet and open hands/5th digits prev visualized.
         Technically difficult due to maternal habitus and fetal position.
Cervix Uterus Adnexa

 Cervix
 Not visualized (advanced GA >[03])

 Uterus
 No abnormality visualized.

 Right Ovary
 Not visualized.
 Left Ovary
 Not visualized.

 Cul De Sac
 No free fluid seen.

 Adnexa
 No adnexal mass visualized.
Impression

 Follow up growth due to chronic hypertension. elevated BMI
 and AMA
 Normal interval growth with measurements consistent with
 dates
 Good fetal movement and amniotic fluid volume
Recommendations

 Follow up growth in 4 weeks.
 Initiate weekly testing. at 32 weeks.

## 2020-12-21 DIAGNOSIS — O09522 Supervision of elderly multigravida, second trimester: Secondary | ICD-10-CM | POA: Diagnosis not present

## 2020-12-21 DIAGNOSIS — O10012 Pre-existing essential hypertension complicating pregnancy, second trimester: Secondary | ICD-10-CM

## 2020-12-21 DIAGNOSIS — O26872 Cervical shortening, second trimester: Secondary | ICD-10-CM | POA: Diagnosis not present

## 2020-12-21 DIAGNOSIS — O99212 Obesity complicating pregnancy, second trimester: Secondary | ICD-10-CM

## 2020-12-21 DIAGNOSIS — Z3686 Encounter for antenatal screening for cervical length: Secondary | ICD-10-CM | POA: Diagnosis not present

## 2020-12-21 DIAGNOSIS — E282 Polycystic ovarian syndrome: Secondary | ICD-10-CM

## 2020-12-21 DIAGNOSIS — E669 Obesity, unspecified: Secondary | ICD-10-CM

## 2020-12-21 DIAGNOSIS — O3482 Maternal care for other abnormalities of pelvic organs, second trimester: Secondary | ICD-10-CM

## 2020-12-21 DIAGNOSIS — Z3A25 25 weeks gestation of pregnancy: Secondary | ICD-10-CM

## 2020-12-23 ENCOUNTER — Other Ambulatory Visit: Payer: Self-pay | Admitting: *Deleted

## 2020-12-23 DIAGNOSIS — O10912 Unspecified pre-existing hypertension complicating pregnancy, second trimester: Secondary | ICD-10-CM

## 2021-01-24 ENCOUNTER — Encounter: Payer: Self-pay | Admitting: *Deleted

## 2021-01-24 ENCOUNTER — Other Ambulatory Visit: Payer: Self-pay

## 2021-01-24 ENCOUNTER — Ambulatory Visit: Payer: BC Managed Care – PPO | Attending: Maternal & Fetal Medicine

## 2021-01-24 ENCOUNTER — Ambulatory Visit: Payer: BC Managed Care – PPO | Admitting: *Deleted

## 2021-01-24 VITALS — BP 130/82 | HR 98

## 2021-01-24 DIAGNOSIS — O10913 Unspecified pre-existing hypertension complicating pregnancy, third trimester: Secondary | ICD-10-CM | POA: Diagnosis present

## 2021-01-24 DIAGNOSIS — E282 Polycystic ovarian syndrome: Secondary | ICD-10-CM

## 2021-01-24 DIAGNOSIS — O10013 Pre-existing essential hypertension complicating pregnancy, third trimester: Secondary | ICD-10-CM

## 2021-01-24 DIAGNOSIS — O99213 Obesity complicating pregnancy, third trimester: Secondary | ICD-10-CM

## 2021-01-24 DIAGNOSIS — O26873 Cervical shortening, third trimester: Secondary | ICD-10-CM | POA: Diagnosis not present

## 2021-01-24 DIAGNOSIS — E669 Obesity, unspecified: Secondary | ICD-10-CM | POA: Diagnosis not present

## 2021-01-24 DIAGNOSIS — O99283 Endocrine, nutritional and metabolic diseases complicating pregnancy, third trimester: Secondary | ICD-10-CM

## 2021-01-24 DIAGNOSIS — O09523 Supervision of elderly multigravida, third trimester: Secondary | ICD-10-CM

## 2021-01-24 DIAGNOSIS — O10912 Unspecified pre-existing hypertension complicating pregnancy, second trimester: Secondary | ICD-10-CM | POA: Diagnosis present

## 2021-01-24 DIAGNOSIS — Z3A30 30 weeks gestation of pregnancy: Secondary | ICD-10-CM

## 2021-01-24 IMAGING — US US MFM OB FOLLOW-UP
1 series · 14 of 28 positions shown · non-contrast
Comparison: none

[Series 1: us mfm ob follow-up · 46 acquisitions, 14 frames shown]
[im 2/46]
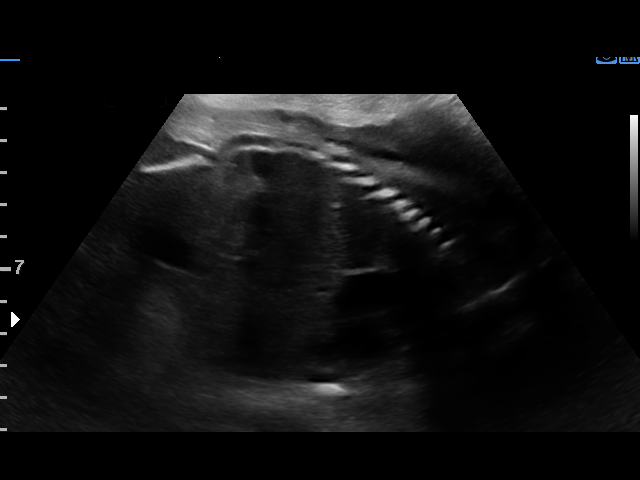
[im 6/46]
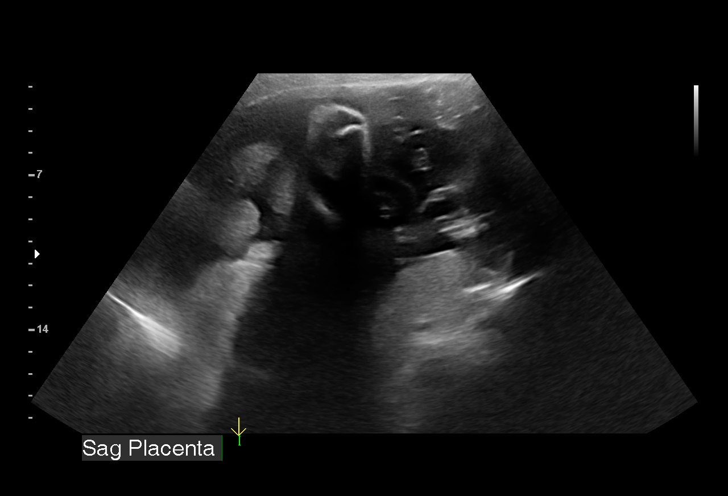
[im 9/46]
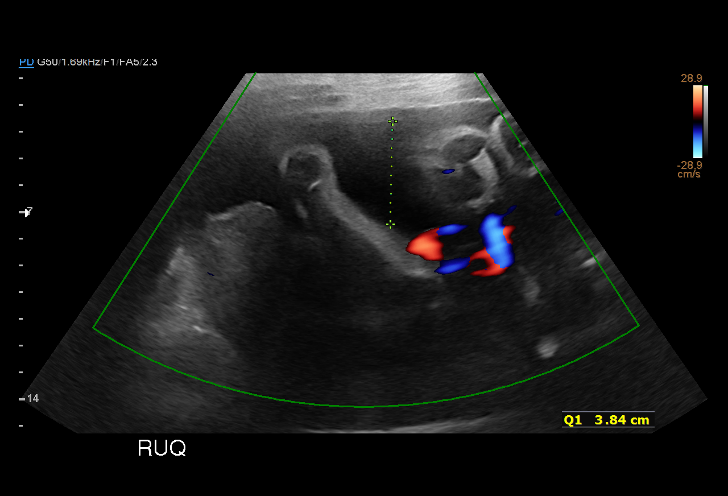
[im 12/46]
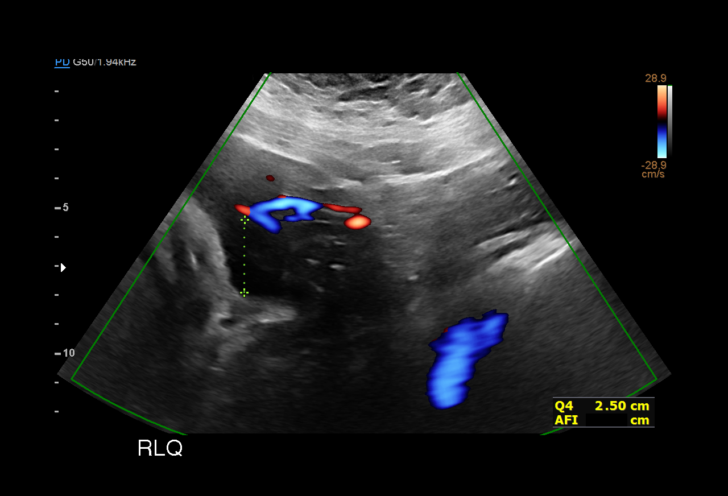
[im 16/46]
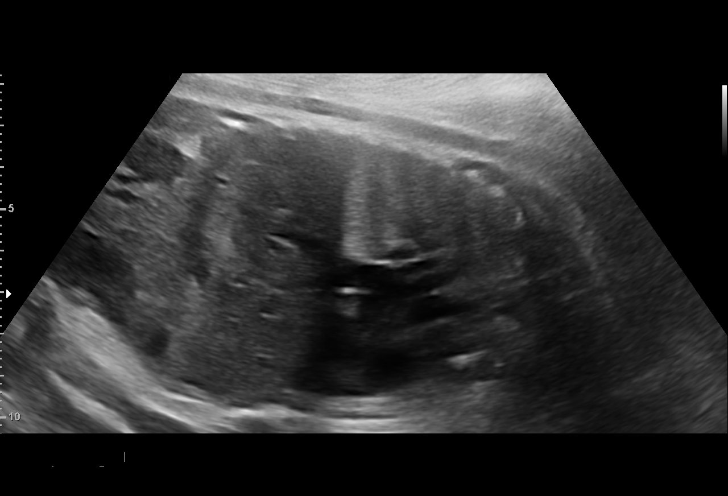
[im 19/46]
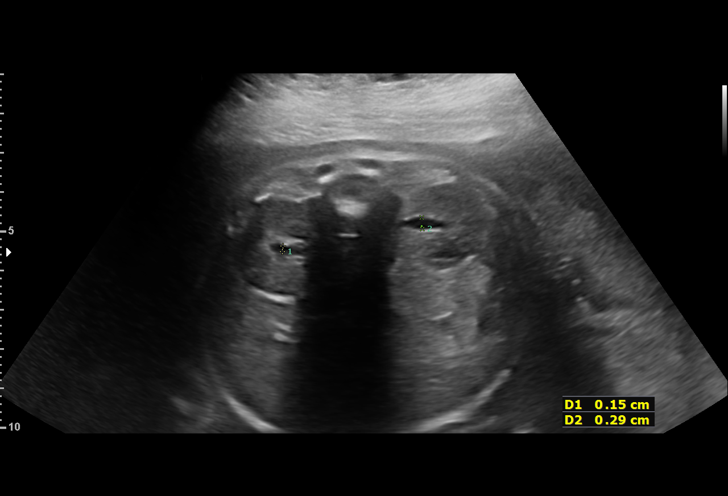
[im 22/46]
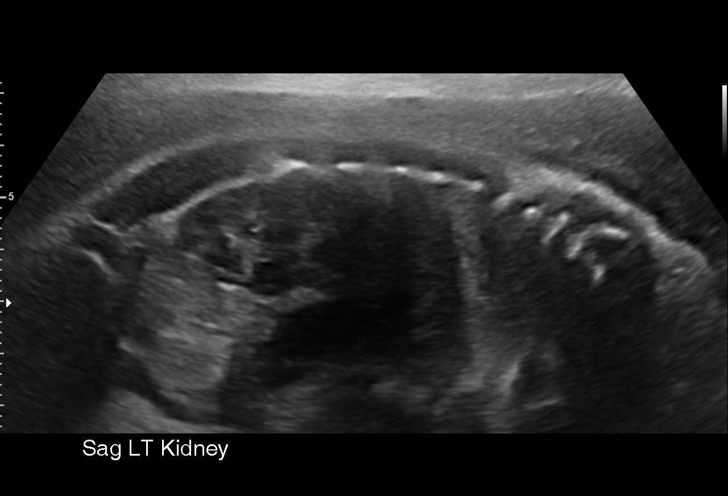
[im 26/46]
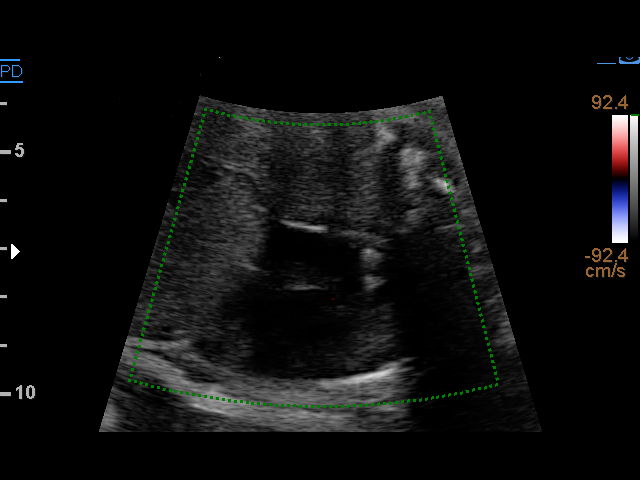
[im 29/46]
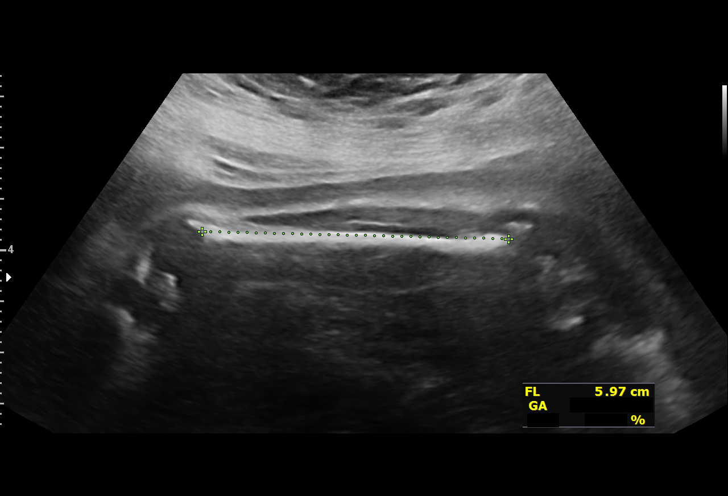
[im 32/46]
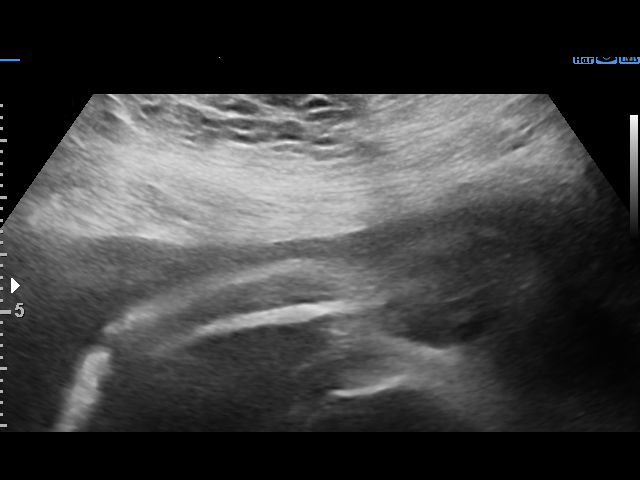
[im 36/46]
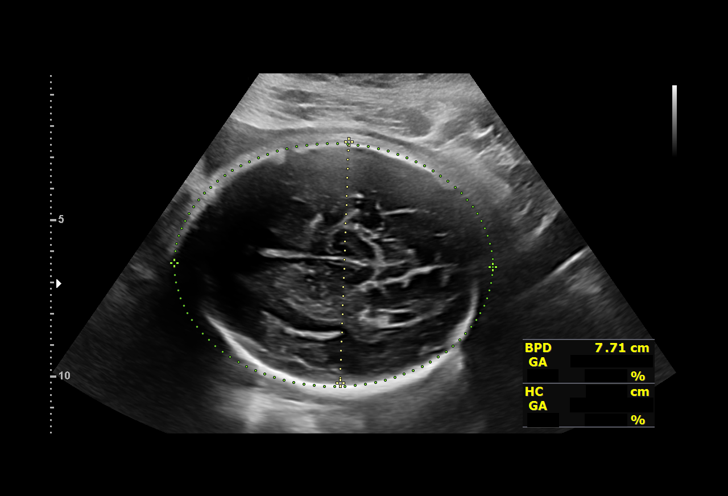
[im 39/46]
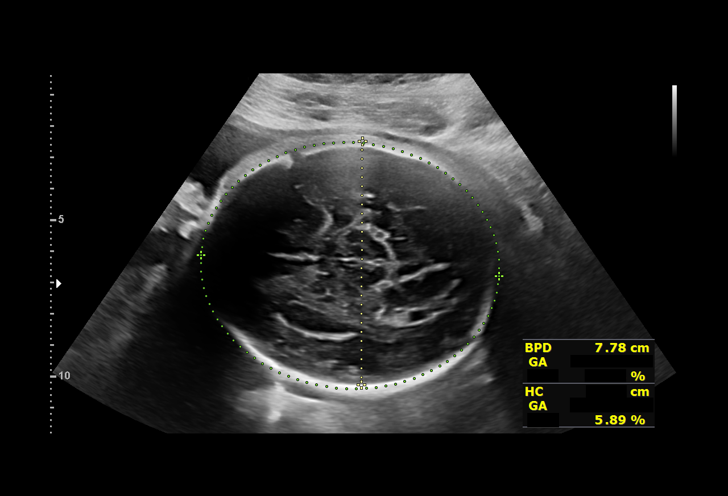
[im 42/46]
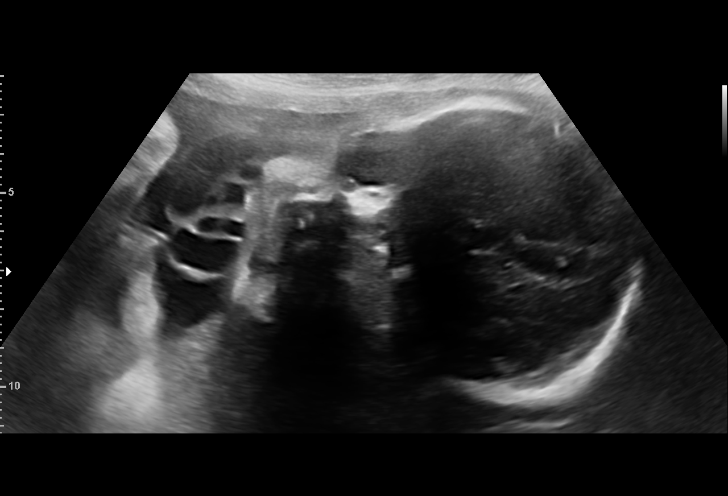
[im 46/46]
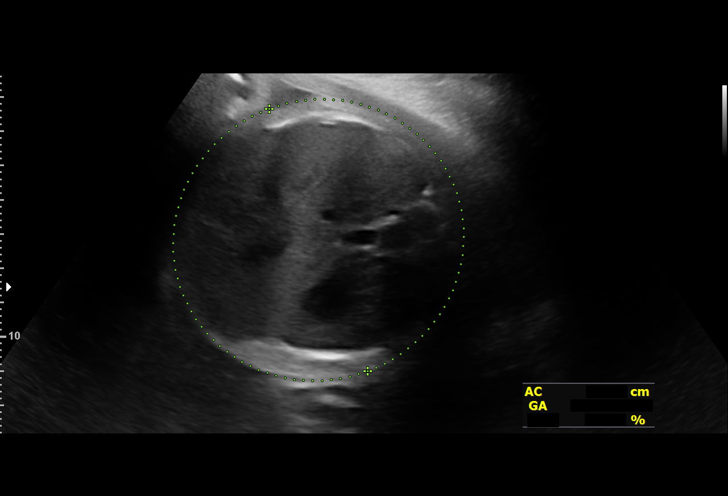

[14 of 28 positions shown; findings below may reference images not displayed]

OB/GYN

                                                      SOMAN

Indications

 Hypertension - Chronic/Pre-existing            [Q1]
 Encounter for cervical length                  [Q1]
 Cervical shortening, second trimester          [Q1]
 Obesity complicating pregnancy, second         [Q1]
 trimester (BMI 38)
 Advanced maternal age multigravida 35+,        [Q1]
 second trimester
 LR NIPS(Negative AFP)
 Medical complication of pregnancy (Hx          [Q1]
 PCOS)
 30 weeks gestation of pregnancy
Fetal Evaluation

 Num Of Fetuses:         1
 Fetal Heart Rate(bpm):  166
 Cardiac Activity:       Observed
 Presentation:           Cephalic
 Placenta:               Posterior
 P. Cord Insertion:      Visualized, central

 Amniotic Fluid
 AFI FV:      Within normal limits

 AFI Sum(cm)     %Tile       Largest Pocket(cm)
 10.52           18
 RUQ(cm)       RLQ(cm)       LUQ(cm)        LLQ(cm)

Biometry

 BPD:      77.2  mm     G. Age:  31w 0d         47  %    CI:        75.47   %    70 - 86
                                                         FL/HC:      21.2   %    19.3 -
 HC:      281.8  mm     G. Age:  30w 6d         19  %    HC/AC:      1.06        0.96 -
 AC:      264.7  mm     G. Age:  30w 4d         42  %    FL/BPD:     77.5   %    71 - 87
 FL:       59.8  mm     G. Age:  31w 1d         48  %    FL/AC:      22.6   %    20 - 24
 HUM:        56  mm     G. Age:  32w 4d         87  %

 LV:        3.1  mm

 Est. FW:    [Q1]  gm    3 lb 10 oz      41  %
OB History

 Blood Type:   B+
 Gravidity:    3         Term:   0        Prem:   0        SAB:   2
 TOP:          0       Ectopic:  0        Living: 0
Gestational Age

 LMP:           30w 5d        Date:  [DATE]                 EDD:   [DATE]
 U/S Today:     30w 6d                                        EDD:   [DATE]
 Best:          30w 5d     Det. By:  LMP  ([DATE])          EDD:   [DATE]
Anatomy

 Cranium:               Previously seen        LVOT:                   Previously seen
 Cavum:                 Previously seen        Aortic Arch:            Previously seen
 Ventricles:            Appears normal         Ductal Arch:            Previously seen
 Choroid Plexus:        Previously seen        Diaphragm:              Appears normal
 Cerebellum:            Previously seen        Stomach:                Appears normal, left
                                                                       sided
 Posterior Fossa:       Previously seen        Abdomen:                Appears normal
 Nuchal Fold:           Not applicable (>20    Abdominal Wall:         Previously seen
                        wks GA)
 Face:                  Orbits and profile     Cord Vessels:           Previously seen
                        previously seen
 Lips:                  Previously seen        Kidneys:                Appear normal
 Palate:                Previously seen        Bladder:                Appears normal
 Thoracic:              Previously seen        Spine:                  Previously seen
 Heart:                 Previously seen        Upper Extremities:      Previously seen
 RVOT:                  Previously seen        Lower Extremities:      Previously seen

 Other:  Fetus appears to be a male. VC, 3VV, Nasal bone and Lenses prev
         visualized. Heels/feet and open hands/5th digits prev visualized.
         Technically difficult due to maternal habitus.
Cervix Uterus Adnexa

 Cervix
 Not visualized (advanced GA >[Q1])

 Uterus
 No abnormality visualized.
 Right Ovary
 Not visualized.

 Left Ovary
 Not visualized.

 Cul De Sac
 No free fluid seen.

 Adnexa
 No adnexal mass visualized.
Comments

 This patient was seen for a follow up growth scan due to
 maternal obesity and chronic hypertension that is not
 currently treated with any medications.  She denies any
 problems since her last exam.
 She was informed that the fetal growth and amniotic fluid
 level appears appropriate for her gestational age.
 A follow up exam was scheduled in 5 weeks to assess the
 fetal growth closer to delivery.

## 2021-01-27 ENCOUNTER — Other Ambulatory Visit: Payer: Self-pay | Admitting: *Deleted

## 2021-01-27 DIAGNOSIS — O10913 Unspecified pre-existing hypertension complicating pregnancy, third trimester: Secondary | ICD-10-CM

## 2021-02-28 ENCOUNTER — Ambulatory Visit: Payer: BC Managed Care – PPO | Admitting: *Deleted

## 2021-02-28 ENCOUNTER — Encounter: Payer: Self-pay | Admitting: *Deleted

## 2021-02-28 ENCOUNTER — Other Ambulatory Visit: Payer: Self-pay

## 2021-02-28 ENCOUNTER — Ambulatory Visit: Payer: BC Managed Care – PPO | Attending: Obstetrics

## 2021-02-28 VITALS — BP 115/79 | HR 98

## 2021-02-28 DIAGNOSIS — E669 Obesity, unspecified: Secondary | ICD-10-CM

## 2021-02-28 DIAGNOSIS — O10913 Unspecified pre-existing hypertension complicating pregnancy, third trimester: Secondary | ICD-10-CM | POA: Insufficient documentation

## 2021-02-28 DIAGNOSIS — O10013 Pre-existing essential hypertension complicating pregnancy, third trimester: Secondary | ICD-10-CM | POA: Diagnosis not present

## 2021-02-28 DIAGNOSIS — I10 Essential (primary) hypertension: Secondary | ICD-10-CM | POA: Insufficient documentation

## 2021-02-28 DIAGNOSIS — O26873 Cervical shortening, third trimester: Secondary | ICD-10-CM

## 2021-02-28 DIAGNOSIS — O09523 Supervision of elderly multigravida, third trimester: Secondary | ICD-10-CM

## 2021-02-28 DIAGNOSIS — O99213 Obesity complicating pregnancy, third trimester: Secondary | ICD-10-CM | POA: Diagnosis not present

## 2021-02-28 DIAGNOSIS — Z3A35 35 weeks gestation of pregnancy: Secondary | ICD-10-CM

## 2021-02-28 DIAGNOSIS — O99282 Endocrine, nutritional and metabolic diseases complicating pregnancy, second trimester: Secondary | ICD-10-CM

## 2021-02-28 DIAGNOSIS — E282 Polycystic ovarian syndrome: Secondary | ICD-10-CM

## 2021-02-28 IMAGING — US US MFM OB FOLLOW-UP
1 series · 14 of 20 positions shown · non-contrast
Comparison: none

[Series 1: us mfm ob follow-up · 20 acquisitions, 14 frames shown]
[im 1/20]
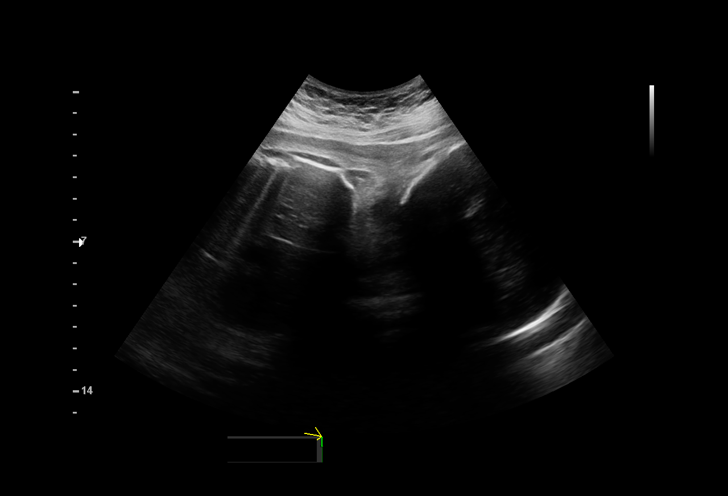
[im 3/20]
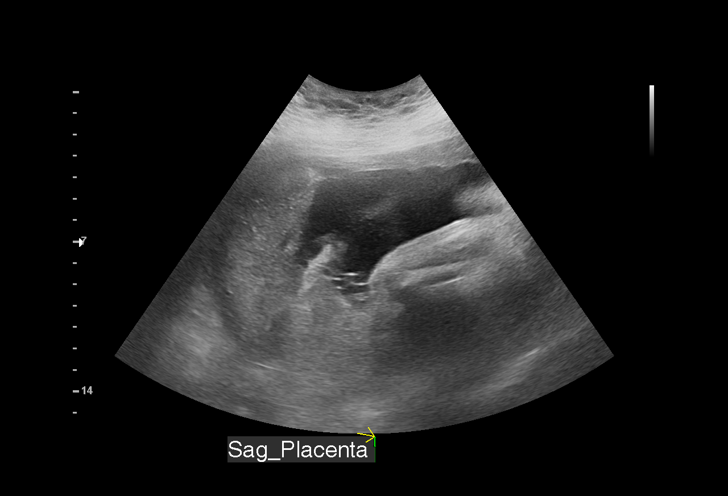
[im 4/20]
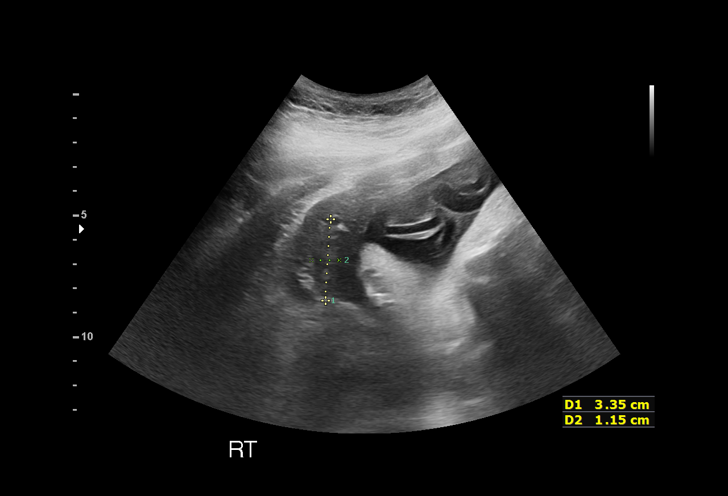
[im 6/20]
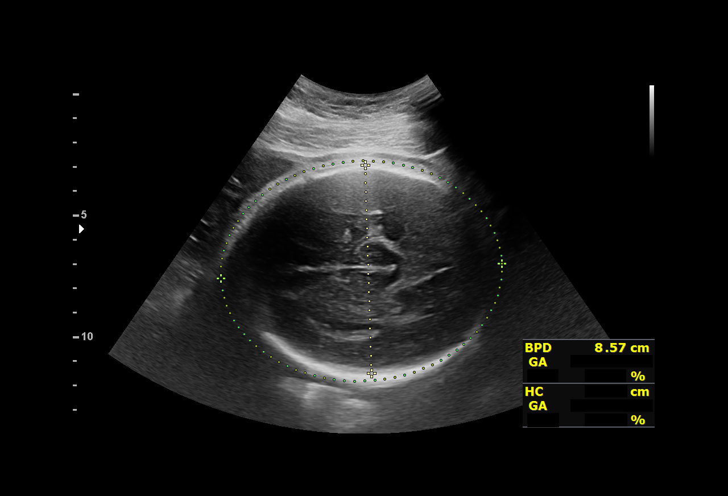
[im 7/20]
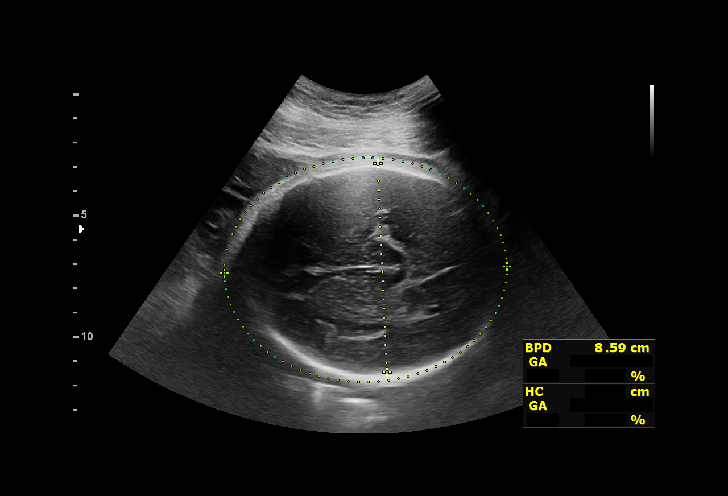
[im 8/20]
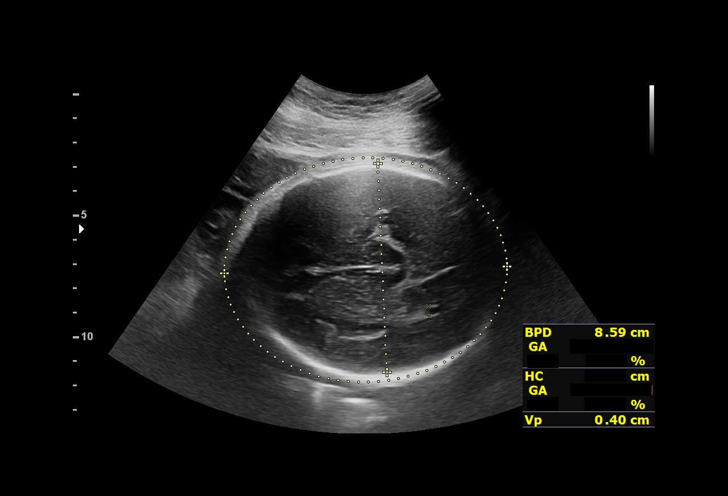
[im 10/20]
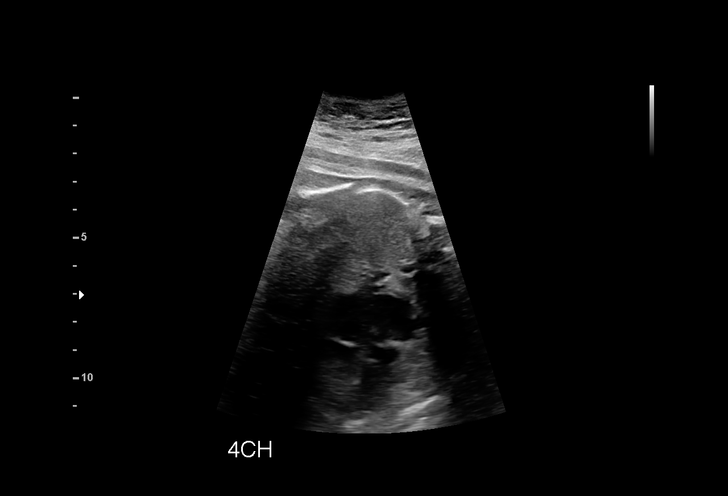
[im 11/20]
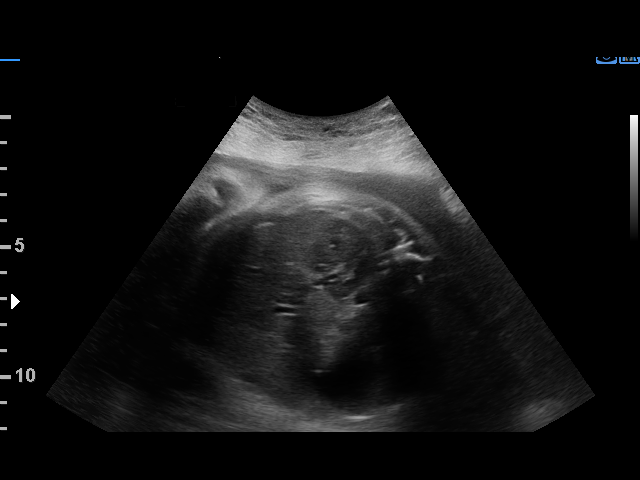
[im 13/20]
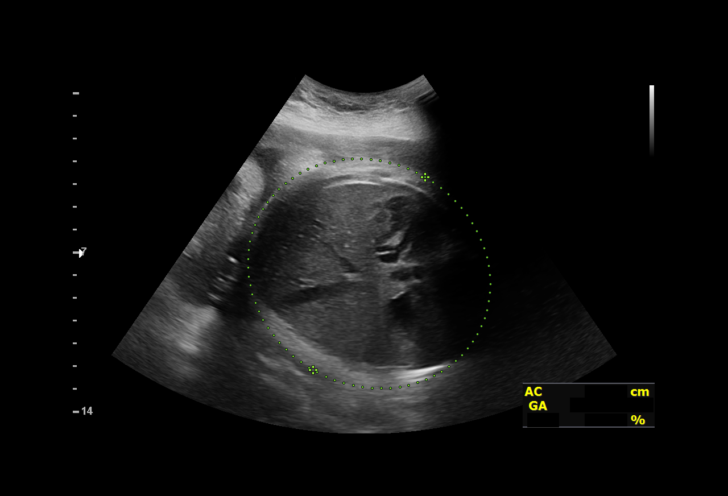
[im 14/20]
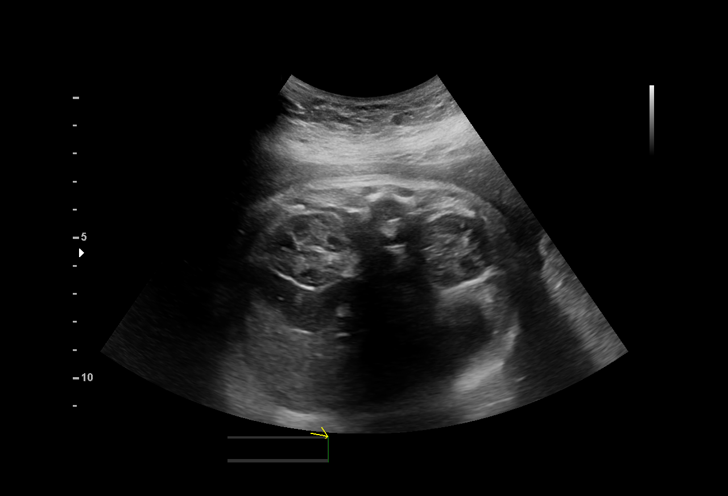
[im 16/20]
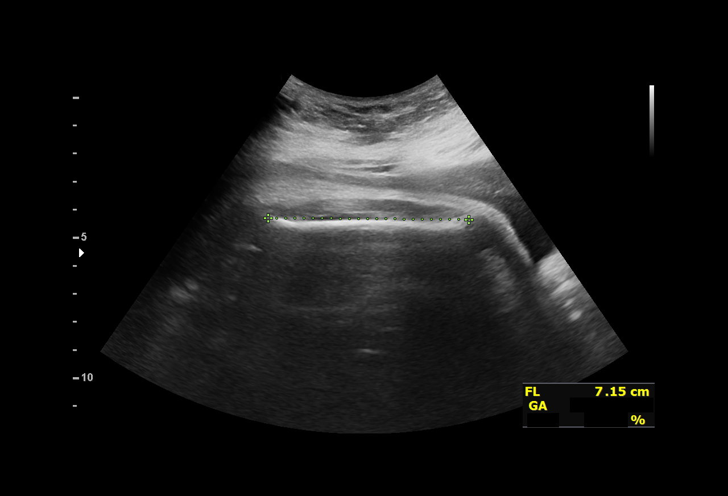
[im 17/20]
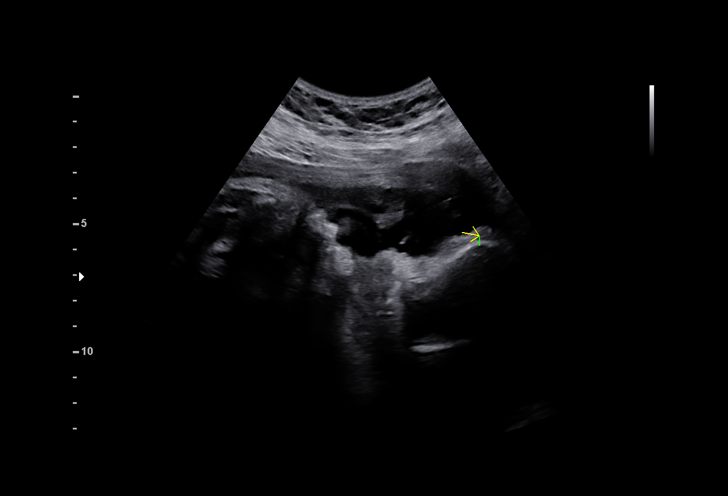
[im 18/20]
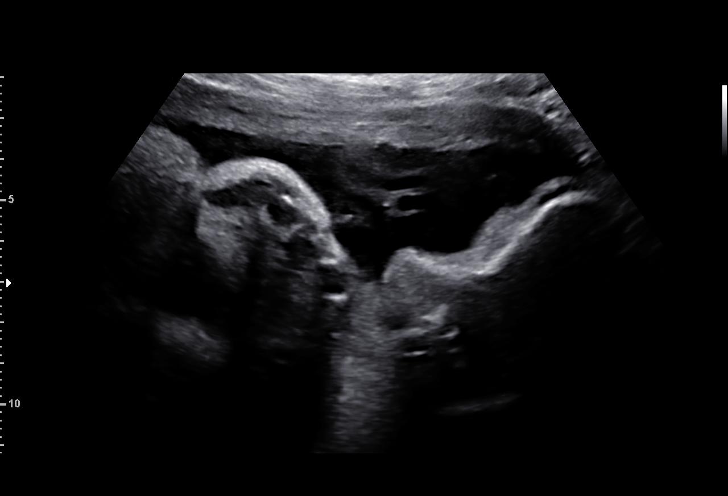
[im 20/20]
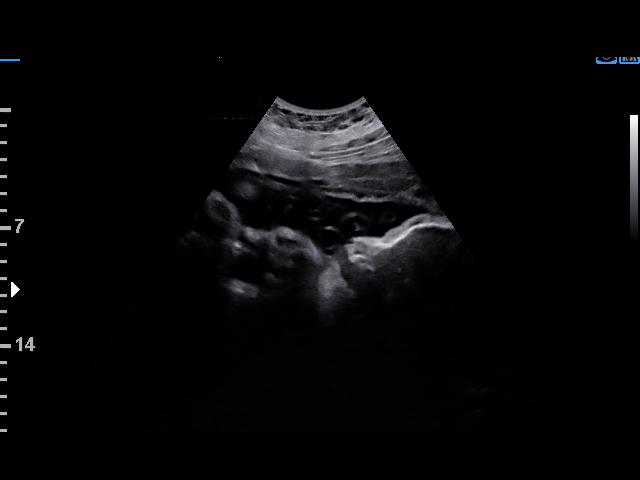

[14 of 20 positions shown; findings below may reference images not displayed]

OB/GYN

Indications

 Hypertension - Chronic/Pre-existing            [11]
 35 weeks gestation of pregnancy
 Cervical shortening complicating pregnancy     [11]
 Obesity complicating pregnancy, third          [11]
 trimester
 Advanced maternal age multigravida 35+,        [11]
 third trimester
 LR NIPS(Negative AFP)
 Medical complication of pregnancy (Hx          [11]
 PCOS)
Fetal Evaluation

 Num Of Fetuses:         1
 Fetal Heart Rate(bpm):  153
 Cardiac Activity:       Observed
 Presentation:           Cephalic
 Placenta:               Posterior
 P. Cord Insertion:      Previously Visualized

 Amniotic Fluid
 AFI FV:      Within normal limits

 AFI Sum(cm)     %Tile       Largest Pocket(cm)
 13.62           48
 RUQ(cm)       RLQ(cm)       LUQ(cm)        LLQ(cm)
 3
Biometry

 BPD:      85.8  mm     G. Age:  34w 4d         25  %    CI:        69.69   %    70 - 86
                                                         FL/HC:      21.8   %    20.1 -
 HC:       328   mm     G. Age:  37w 2d         57  %    HC/AC:      1.02        0.93 -
 AC:      322.5  mm     G. Age:  36w 1d         72  %    FL/BPD:     83.3   %    71 - 87
 FL:       71.5  mm     G. Age:  36w 4d         69  %    FL/AC:      22.2   %    20 - 24
 LV:          4  mm

 Est. FW:    [11]  gm      6 lb 6 oz     65  %
OB History

 Blood Type:   B+
 Gravidity:    3         Term:   0        Prem:   0        SAB:   2
 TOP:          0       Ectopic:  0        Living: 0
Gestational Age

 LMP:           35w 5d        Date:  [DATE]                 EDD:   [DATE]
 U/S Today:     36w 1d                                        EDD:   [DATE]
 Best:          35w 5d     Det. By:  LMP  ([DATE])          EDD:   [DATE]
Anatomy

 Cranium:               Appears normal         LVOT:                   Previously seen
 Cavum:                 Previously seen        Aortic Arch:            Previously seen
 Ventricles:            Appears normal         Ductal Arch:            Previously seen
 Choroid Plexus:        Previously seen        Diaphragm:              Appears normal
 Cerebellum:            Previously seen        Stomach:                Appears normal, left
                                                                       sided
 Posterior Fossa:       Previously seen        Abdomen:                Previously seen
 Nuchal Fold:           Not applicable (>20    Abdominal Wall:         Previously seen
                        wks GA)
 Face:                  Orbits and profile     Cord Vessels:           Previously seen
                        previously seen
 Lips:                  Previously seen        Kidneys:                Appear normal
 Palate:                Previously seen        Bladder:                Appears normal
 Thoracic:              Previously seen        Spine:                  Previously seen
 Heart:                 Appears normal         Upper Extremities:      Previously seen
                        (4CH, axis, and
                        situs)
 RVOT:                  Previously seen        Lower Extremities:      Previously seen

 Other:  Fetus appears to be a male. VC, 3VV, Nasal bone and Lenses prev
         visualized. Heels/feet and open hands/5th digits prev visualized.
Cervix Uterus Adnexa

 Cervix
 Not visualized (advanced GA >[11])

 Right Ovary
 Visualized.
 Left Ovary
 Visualized.
Comments

 This patient was seen for a follow up growth scan due to
 chronic hypertension that is not treated with any medications.
 She denies any problems since her last exam.
 She was informed that the fetal growth and amniotic fluid
 level appears appropriate for her gestational age.
 As the fetal growth is within normal limits, no further exams
 were scheduled in our office.

## 2021-03-19 ENCOUNTER — Inpatient Hospital Stay (HOSPITAL_COMMUNITY)
Admission: AD | Admit: 2021-03-19 | Discharge: 2021-03-19 | Disposition: A | Discharge status: Home / Self Care | Payer: BC Managed Care – PPO | Attending: Obstetrics and Gynecology | Admitting: Obstetrics and Gynecology

## 2021-03-19 ENCOUNTER — Other Ambulatory Visit: Payer: Self-pay

## 2021-03-19 ENCOUNTER — Encounter (HOSPITAL_COMMUNITY): Payer: Self-pay | Admitting: Obstetrics and Gynecology

## 2021-03-19 DIAGNOSIS — O10013 Pre-existing essential hypertension complicating pregnancy, third trimester: Secondary | ICD-10-CM

## 2021-03-19 DIAGNOSIS — Z3A39 39 weeks gestation of pregnancy: Secondary | ICD-10-CM | POA: Diagnosis not present

## 2021-03-19 LAB — URINALYSIS, ROUTINE W REFLEX MICROSCOPIC
Bilirubin Urine: NEGATIVE
Glucose, UA: NEGATIVE mg/dL
Hgb urine dipstick: NEGATIVE
Ketones, ur: NEGATIVE mg/dL
Leukocytes,Ua: NEGATIVE
Nitrite: NEGATIVE
Protein, ur: NEGATIVE mg/dL
Specific Gravity, Urine: 1.012 (ref 1.005–1.030)
pH: 6 (ref 5.0–8.0)

## 2021-03-19 LAB — COMPREHENSIVE METABOLIC PANEL WITH GFR
ALT: 13 U/L (ref 0–44)
AST: 20 U/L (ref 15–41)
Albumin: 3 g/dL — ABNORMAL LOW (ref 3.5–5.0)
Alkaline Phosphatase: 201 U/L — ABNORMAL HIGH (ref 38–126)
Anion gap: 8 (ref 5–15)
BUN: 7 mg/dL (ref 6–20)
CO2: 21 mmol/L — ABNORMAL LOW (ref 22–32)
Calcium: 9.1 mg/dL (ref 8.9–10.3)
Chloride: 103 mmol/L (ref 98–111)
Creatinine, Ser: 0.65 mg/dL (ref 0.44–1.00)
GFR, Estimated: >60 mL/min
Glucose, Bld: 81 mg/dL (ref 70–99)
Potassium: 3.9 mmol/L (ref 3.5–5.1)
Sodium: 132 mmol/L — ABNORMAL LOW (ref 135–145)
Total Bilirubin: 0.3 mg/dL (ref 0.3–1.2)
Total Protein: 6.6 g/dL (ref 6.5–8.1)

## 2021-03-19 LAB — CBC
HCT: 36.4 % (ref 36.0–46.0)
Hemoglobin: 12.4 g/dL (ref 12.0–15.0)
MCH: 28.6 pg (ref 26.0–34.0)
MCHC: 34.1 g/dL (ref 30.0–36.0)
MCV: 83.9 fL (ref 80.0–100.0)
Platelets: 256 K/uL (ref 150–400)
RBC: 4.34 MIL/uL (ref 3.87–5.11)
RDW: 13.2 % (ref 11.5–15.5)
WBC: 6.9 K/uL (ref 4.0–10.5)
nRBC: 0 % (ref 0.0–0.2)

## 2021-03-19 LAB — PROTEIN / CREATININE RATIO, URINE
Creatinine, Urine: 105.04 mg/dL
Protein Creatinine Ratio: 0.11 mg/mg{Cre} (ref 0.00–0.15)
Total Protein, Urine: 12 mg/dL

## 2021-03-19 LAB — OB RESULTS CONSOLE GBS: GBS: POSITIVE

## 2021-03-19 NOTE — MAU Provider Note (Signed)
History     Chief Complaint  Patient presents with   Hypertension   Abdominal Pain   40 yo [id number] BF @ 38 3/[redacted] weeks gestation sent from the office for further evaluation due to elevated BPs in the office. Pt denies h/a, visual changes or epigastric pain. (+) FM. Known chronic HTN on no med  OB History     Gravida  3   Para      Term      Preterm      AB  2   Living  0      SAB  2   IAB      Ectopic      Multiple      Live Births              Past Medical History:  Diagnosis Date   Obesity    PCOS (polycystic ovarian syndrome)    PID (pelvic inflammatory disease)    Ruptured ovarian cyst    Vaginal Pap smear, abnormal     Past Surgical History:  Procedure Laterality Date   DILATION AND CURETTAGE OF UTERUS     LAPAROSCOPY      Family History  Problem Relation Age of Onset   Asthma Brother    Cancer Maternal Grandmother    Cancer Maternal Grandfather    Diabetes Maternal Grandfather    Stroke Paternal Grandfather     Social History   Tobacco Use   Smoking status: Former   Smokeless tobacco: Never  Building services engineer Use: Never used  Substance Use Topics   Alcohol use: Not Currently    Comment: occ   Drug use: Never    Allergies: No Known Allergies  Medications Prior to Admission  Medication Sig Dispense Refill Last Dose   aspirin EC 81 MG tablet Take 81 mg by mouth daily. Swallow whole.   03/19/2021   Prenatal Vit-Fe Fumarate-FA (PRENATAL MULTIVITAMIN) TABS tablet Take 1 tablet by mouth daily at 12 noon.   03/19/2021     Physical Exam   Blood pressure 123/79, pulse 85, temperature 98.9 F (37.2 C), temperature source Oral, resp. rate 16, height 5\' 3"  (1.6 m), weight 115.2 kg, last menstrual period 06/23/2020, SpO2 100 %.  Patient Vitals for the past 24 hrs:  BP Temp Temp src Pulse Resp SpO2 Height Weight  03/19/21 1335 -- -- -- -- -- 100 % -- --  03/19/21 1333 123/79 -- -- 85 -- -- -- --  03/19/21 1331 123/79 -- -- 85 -- --  -- --  03/19/21 1330 -- -- -- -- -- 100 % -- --  03/19/21 1325 -- -- -- -- -- 100 % -- --  03/19/21 1320 -- -- -- -- -- 100 % -- --  03/19/21 1316 (!) 135/93 -- -- 82 -- -- -- --  03/19/21 1315 -- -- -- -- -- 100 % -- --  03/19/21 1310 -- -- -- -- -- 100 % -- --  03/19/21 1305 -- -- -- -- -- 100 % -- --  03/19/21 1301 (!) 141/93 -- -- 83 -- -- -- --  03/19/21 1300 -- -- -- -- -- 100 % -- --  03/19/21 1255 -- -- -- -- -- 99 % -- --  03/19/21 1250 -- -- -- -- -- 100 % -- --  03/19/21 1246 (!) 147/98 -- -- 84 -- -- -- --  03/19/21 1245 -- -- -- -- -- 100 % -- --  03/19/21 1240 -- -- -- -- --  100 % -- --  03/19/21 1237 (!) 148/100 -- -- 84 -- -- -- --  03/19/21 1235 -- -- -- -- -- 100 % -- --  03/19/21 1216 (!) 148/106 98.9 F (37.2 C) Oral 94 16 100 % -- --  03/19/21 1213 -- -- -- -- -- -- 5\' 3"  (1.6 m) 115.2 kg    General appearance: alert, cooperative, and no distress Lungs: clear to auscultation bilaterally Heart: regular rate and rhythm, S1, S2 normal, no murmur, click, rub or gallop Abdomen:  gravid soft Extremities: no edema, redness or tenderness in the calves or thighs Tracing: baseline 140 (+) accel 155-160 No ctx  CBC Latest Ref Rng & Units 03/19/2021 08/14/2010 11/24/2009  WBC 4.0 - 10.5 K/uL 6.9 8.5 -  Hemoglobin 12.0 - 15.0 g/dL 01/24/2010 08.1 44.8  Hematocrit 36.0 - 46.0 % 36.4 36.3 41.0  Platelets 150 - 400 K/uL 256 270 -    CMP Latest Ref Rng & Units 03/19/2021 11/24/2009  Glucose 70 - 99 mg/dL 81 79  BUN 6 - 20 mg/dL 7 10  Creatinine 01/24/2010 - 1.00 mg/dL 6.31 0.7  Sodium 4.97 - 145 mmol/L 132(L) 139  Potassium 3.5 - 5.1 mmol/L 3.9 4.3  Chloride 98 - 111 mmol/L 103 105  CO2 22 - 32 mmol/L 21(L) -  Calcium 8.9 - 10.3 mg/dL 9.1 -  Total Protein 6.5 - 8.1 g/dL 6.6 -  Total Bilirubin 0.3 - 1.2 mg/dL 0.3 -  Alkaline Phos 38 - 126 U/L 201(H) -  AST 15 - 41 U/L 20 -  ALT 0 - 44 U/L 13 -   PCR 0.11 IMP: chronic HTN in third trimester w/o evidence of superimposed   Preeclampsia IUP @ 38 3/7 wk GBS cx positive P) d/c home. Preeclampsia warning signs. Labor prec. Will schedule IOL ED Course   MDM   5/7, MD 2:01 PM 03/19/2021

## 2021-03-19 NOTE — MAU Note (Signed)
Erin [name] is a 40 y.o. at [redacted]w[redacted]d here in MAU reporting: was sent over from the office for HTN. It was 160/something and 140/90. No headache, visual changes, or RUQ pain. No bleeding or LOF. +FM. Having some cramping.   Onset of complaint: today  Pain score: 2/10  Vitals:   03/19/21 1216  BP: (!) 148/106  Pulse: 94  Resp: 16  Temp: 98.9 F (37.2 C)  SpO2: 100%     FHT: doppler deferred, pt wearing a dress. Reporting + FM  Lab orders placed from triage: UA

## 2021-03-24 ENCOUNTER — Telehealth (HOSPITAL_COMMUNITY): Payer: Self-pay | Admitting: *Deleted

## 2021-03-24 NOTE — Telephone Encounter (Signed)
Preadmission screen  

## 2021-03-26 ENCOUNTER — Encounter (HOSPITAL_COMMUNITY): Payer: Self-pay | Admitting: Obstetrics and Gynecology

## 2021-03-26 ENCOUNTER — Inpatient Hospital Stay (HOSPITAL_COMMUNITY): Payer: BC Managed Care – PPO

## 2021-03-26 ENCOUNTER — Inpatient Hospital Stay (HOSPITAL_COMMUNITY)
Admission: AD | Admit: 2021-03-26 | Discharge: 2021-03-29 | DRG: 807 | Disposition: A | Discharge status: Home / Self Care | Payer: BC Managed Care – PPO | Attending: Obstetrics and Gynecology | Admitting: Obstetrics and Gynecology

## 2021-03-26 ENCOUNTER — Other Ambulatory Visit: Payer: Self-pay | Admitting: Obstetrics and Gynecology

## 2021-03-26 ENCOUNTER — Other Ambulatory Visit: Payer: Self-pay

## 2021-03-26 ENCOUNTER — Inpatient Hospital Stay (HOSPITAL_COMMUNITY): Payer: BC Managed Care – PPO | Admitting: Anesthesiology

## 2021-03-26 DIAGNOSIS — O10913 Unspecified pre-existing hypertension complicating pregnancy, third trimester: Secondary | ICD-10-CM | POA: Diagnosis present

## 2021-03-26 DIAGNOSIS — O1002 Pre-existing essential hypertension complicating childbirth: Principal | ICD-10-CM | POA: Diagnosis present

## 2021-03-26 DIAGNOSIS — O10919 Unspecified pre-existing hypertension complicating pregnancy, unspecified trimester: Secondary | ICD-10-CM | POA: Diagnosis present

## 2021-03-26 DIAGNOSIS — Z87891 Personal history of nicotine dependence: Secondary | ICD-10-CM | POA: Diagnosis not present

## 2021-03-26 DIAGNOSIS — O9902 Anemia complicating childbirth: Secondary | ICD-10-CM | POA: Diagnosis present

## 2021-03-26 DIAGNOSIS — O99824 Streptococcus B carrier state complicating childbirth: Secondary | ICD-10-CM | POA: Diagnosis present

## 2021-03-26 DIAGNOSIS — Z3A39 39 weeks gestation of pregnancy: Secondary | ICD-10-CM | POA: Diagnosis not present

## 2021-03-26 LAB — COMPREHENSIVE METABOLIC PANEL
ALT: 12 U/L (ref 0–44)
AST: 24 U/L (ref 15–41)
Albumin: 2.8 g/dL — ABNORMAL LOW (ref 3.5–5.0)
Alkaline Phosphatase: 213 U/L — ABNORMAL HIGH (ref 38–126)
Anion gap: 9 (ref 5–15)
BUN: 11 mg/dL (ref 6–20)
CO2: 21 mmol/L — ABNORMAL LOW (ref 22–32)
Calcium: 9.1 mg/dL (ref 8.9–10.3)
Chloride: 105 mmol/L (ref 98–111)
Creatinine, Ser: 0.73 mg/dL (ref 0.44–1.00)
GFR, Estimated: >60 mL/min (ref >60)
Glucose, Bld: 99 mg/dL (ref 70–99)
Potassium: 4.1 mmol/L (ref 3.5–5.1)
Sodium: 135 mmol/L (ref 135–145)
Total Bilirubin: 0.8 mg/dL (ref 0.3–1.2)
Total Protein: 6.1 g/dL — ABNORMAL LOW (ref 6.5–8.1)

## 2021-03-26 LAB — CBC
HCT: 35.6 % — ABNORMAL LOW (ref 36.0–46.0)
Hemoglobin: 11.9 g/dL — ABNORMAL LOW (ref 12.0–15.0)
MCH: 28.1 pg (ref 26.0–34.0)
MCHC: 33.4 g/dL (ref 30.0–36.0)
MCV: 84 fL (ref 80.0–100.0)
Platelets: 229 10*3/uL (ref 150–400)
RBC: 4.24 MIL/uL (ref 3.87–5.11)
RDW: 13.6 % (ref 11.5–15.5)
WBC: 5.8 10*3/uL (ref 4.0–10.5)
nRBC: 0 % (ref 0.0–0.2)

## 2021-03-26 LAB — SARS CORONAVIRUS 2 (TAT 6-24 HRS): SARS Coronavirus 2: NEGATIVE

## 2021-03-26 LAB — TYPE AND SCREEN
ABO/RH(D): B POS
Antibody Screen: NEGATIVE

## 2021-03-26 LAB — PROTEIN / CREATININE RATIO, URINE
Creatinine, Urine: 330.18 mg/dL
Protein Creatinine Ratio: 0.1 mg/mg{Cre} (ref 0.00–0.15)
Total Protein, Urine: 32 mg/dL

## 2021-03-26 MED ORDER — PENICILLIN G POT IN DEXTROSE 60000 UNIT/ML IV SOLN
3.0000 10*6.[IU] | INTRAVENOUS | Status: DC
  Administered 2021-03-26 – 2021-03-27 (×6): 3 10*6.[IU] via INTRAVENOUS
  Filled 2021-03-26 (×7): qty 50

## 2021-03-26 MED ORDER — SOD CITRATE-CITRIC ACID 500-334 MG/5ML PO SOLN: 30.0000 mL | ORAL | Status: DC | PRN

## 2021-03-26 MED ORDER — LACTATED RINGERS IV SOLN
500.0000 mL | Freq: Once | INTRAVENOUS | Status: AC
  Administered 2021-03-26: 500 mL via INTRAVENOUS

## 2021-03-26 MED ORDER — EPHEDRINE 5 MG/ML INJ
10.0000 mg | INTRAVENOUS | Status: DC | PRN
  Administered 2021-03-26: 10 mg via INTRAVENOUS

## 2021-03-26 MED ORDER — HYDRALAZINE HCL 20 MG/ML IJ SOLN
10.0000 mg | INTRAMUSCULAR | Status: DC | PRN
Start: 2021-03-26 — End: 2021-03-27

## 2021-03-26 MED ORDER — PHENYLEPHRINE 40 MCG/ML (10ML) SYRINGE FOR IV PUSH (FOR BLOOD PRESSURE SUPPORT)
80.0000 ug | PREFILLED_SYRINGE | INTRAVENOUS | Status: DC | PRN
  Filled 2021-03-26: qty 10

## 2021-03-26 MED ORDER — ONDANSETRON HCL 4 MG/2ML IJ SOLN
4.0000 mg | Freq: Four times a day (QID) | INTRAMUSCULAR | Status: DC | PRN
  Administered 2021-03-26 – 2021-03-27 (×2): 4 mg via INTRAVENOUS
  Filled 2021-03-26 (×2): qty 2

## 2021-03-26 MED ORDER — LABETALOL HCL 5 MG/ML IV SOLN: 80.0000 mg | INTRAVENOUS | Status: DC | PRN

## 2021-03-26 MED ORDER — LACTATED RINGERS IV SOLN
500.0000 mL | INTRAVENOUS | Status: DC | PRN
  Administered 2021-03-27: 500 mL via INTRAVENOUS
  Administered 2021-03-27: 1000 mL via INTRAVENOUS

## 2021-03-26 MED ORDER — OXYTOCIN 10 UNIT/ML IJ SOLN: 10.0000 [IU] | Freq: Once | INTRAMUSCULAR | Status: DC

## 2021-03-26 MED ORDER — ACETAMINOPHEN 325 MG PO TABS: 650.0000 mg | ORAL_TABLET | ORAL | Status: DC | PRN

## 2021-03-26 MED ORDER — LIDOCAINE HCL (PF) 1 % IJ SOLN
INTRAMUSCULAR | Status: DC | PRN
  Administered 2021-03-26: 12 mL via EPIDURAL

## 2021-03-26 MED ORDER — LIDOCAINE HCL (PF) 1 % IJ SOLN: 30.0000 mL | INTRAMUSCULAR | Status: DC | PRN

## 2021-03-26 MED ORDER — OXYCODONE-ACETAMINOPHEN 5-325 MG PO TABS: 1.0000 | ORAL_TABLET | ORAL | Status: DC | PRN

## 2021-03-26 MED ORDER — OXYCODONE-ACETAMINOPHEN 5-325 MG PO TABS: 2.0000 | ORAL_TABLET | ORAL | Status: DC | PRN

## 2021-03-26 MED ORDER — OXYTOCIN BOLUS FROM INFUSION
333.0000 mL | Freq: Once | INTRAVENOUS | Status: AC
  Administered 2021-03-27: 333 mL via INTRAVENOUS

## 2021-03-26 MED ORDER — EPHEDRINE 5 MG/ML INJ
10.0000 mg | INTRAVENOUS | Status: DC | PRN
  Filled 2021-03-26: qty 5

## 2021-03-26 MED ORDER — OXYTOCIN-SODIUM CHLORIDE 30-0.9 UT/500ML-% IV SOLN
2.5000 [IU]/h | INTRAVENOUS | Status: DC
  Filled 2021-03-26: qty 500

## 2021-03-26 MED ORDER — OXYTOCIN-SODIUM CHLORIDE 30-0.9 UT/500ML-% IV SOLN
1.0000 m[IU]/min | INTRAVENOUS | Status: DC
Start: 2021-03-26 — End: 2021-03-27
  Administered 2021-03-26: 2 m[IU]/min via INTRAVENOUS
  Filled 2021-03-26: qty 500

## 2021-03-26 MED ORDER — DIPHENHYDRAMINE HCL 50 MG/ML IJ SOLN
12.5000 mg | INTRAMUSCULAR | Status: DC | PRN
  Administered 2021-03-27: 12.5 mg via INTRAVENOUS
  Filled 2021-03-26: qty 1

## 2021-03-26 MED ORDER — FENTANYL-BUPIVACAINE-NACL 0.5-0.125-0.9 MG/250ML-% EP SOLN
12.0000 mL/h | EPIDURAL | Status: DC | PRN
  Administered 2021-03-26 – 2021-03-27 (×2): 12 mL/h via EPIDURAL
  Filled 2021-03-26 (×2): qty 250

## 2021-03-26 MED ORDER — SODIUM CHLORIDE 0.9 % IV SOLN
5.0000 10*6.[IU] | Freq: Once | INTRAVENOUS | Status: AC
  Administered 2021-03-26: 5 10*6.[IU] via INTRAVENOUS
  Filled 2021-03-26: qty 5

## 2021-03-26 MED ORDER — PHENYLEPHRINE 40 MCG/ML (10ML) SYRINGE FOR IV PUSH (FOR BLOOD PRESSURE SUPPORT)
80.0000 ug | PREFILLED_SYRINGE | INTRAVENOUS | Status: AC | PRN
Start: 2021-03-26 — End: 2021-03-26
  Administered 2021-03-26 (×3): 80 ug via INTRAVENOUS

## 2021-03-26 MED ORDER — LABETALOL HCL 5 MG/ML IV SOLN: 20.0000 mg | INTRAVENOUS | Status: DC | PRN

## 2021-03-26 MED ORDER — FENTANYL CITRATE (PF) 100 MCG/2ML IJ SOLN
100.0000 ug | INTRAMUSCULAR | Status: DC | PRN
  Administered 2021-03-26 (×2): 100 ug via INTRAVENOUS
  Filled 2021-03-26 (×2): qty 2

## 2021-03-26 MED ORDER — LACTATED RINGERS IV SOLN: INTRAVENOUS | Status: DC

## 2021-03-26 MED ORDER — LABETALOL HCL 5 MG/ML IV SOLN: 40.0000 mg | INTRAVENOUS | Status: DC | PRN

## 2021-03-26 NOTE — Progress Notes (Signed)
S: just got epidural Foley out  O: BP 95/64   Pulse (!) 113   Temp 98.5 F (36.9 C) (Oral)   Resp 16   Ht 5\' 3"  (1.6 m)   Wt 117.2 kg   LMP 06/23/2020   SpO2 99%   BMI 45.76 kg/m   Pitocin 10 miu  VE 5/70/=3. ROP asynclitic Tracing: baseline 150 (+) accel 160 Ctx q 2-3 1/2 mins CBC Latest Ref Rng & Units 03/26/2021 03/19/2021 08/14/2010  WBC 4.0 - 10.5 K/uL 5.8 6.9 8.5  Hemoglobin 12.0 - 15.0 g/dL 11.9(L) 12.4 12.2  Hematocrit 36.0 - 46.0 % 35.6(L) 36.4 36.3  Platelets 150 - 400 K/uL 229 256 270    CMP Latest Ref Rng & Units 03/26/2021 03/19/2021 11/24/2009  Glucose 70 - 99 mg/dL 99 81 79  BUN 6 - 20 mg/dL 11 7 10   Creatinine 0.44 - 1.00 mg/dL 01/24/2010 0.7  Sodium 6.72 - 145 mmol/L 135 132(L) 139  Potassium 3.5 - 5.1 mmol/L 4.1 3.9 4.3  Chloride 98 - 111 mmol/L 105 103 105  CO2 22 - 32 mmol/L 21(L) 21(L) -  Calcium 8.9 - 10.3 mg/dL 9.1 9.1 -  Total Protein 6.5 - 8.1 g/dL 6.1(L) 6.6 -  Total Bilirubin 0.3 - 1.2 mg/dL 0.8 0.3 -  Alkaline Phos 38 - 126 U/L 213(H) 201(H) -  AST 15 - 41 U/L 24 20 -  ALT 0 - 44 U/L 12 13 -   IMP: chronic HTN in third trimester GBS cx (+) on IV PCN Term gestation Active phase P) cont with pitocin. Defer amniotomy, right reverse cow girl position

## 2021-03-26 NOTE — Progress Notes (Signed)
S: no complaint  O; BP (!) 139/101   Pulse 97   Temp 98.3 F (36.8 C) (Oral)   Resp 18   Ht 5\' 3"  (1.6 m)   Wt 117.2 kg   LMP 06/23/2020   BMI 45.76 kg/m   VE 1/70/-3 vtx deviated to mat right Intracervical balloon placed  Tracing: baseline 150 (+) accel 160 No ctx  CBC Latest Ref Rng & Units 03/26/2021 03/19/2021 08/14/2010  WBC 4.0 - 10.5 K/uL 5.8 6.9 8.5  Hemoglobin 12.0 - 15.0 g/dL 11.9(L) 12.4 12.2  Hematocrit 36.0 - 46.0 % 35.6(L) 36.4 36.3  Platelets 150 - 400 K/uL 229 256 270   IMP: chronic HTN  Term GBS cx (+) on IV PCN P) cont pitocin.  Check PCR, CMP. Cont IV pCN

## 2021-03-26 NOTE — Anesthesia Procedure Notes (Signed)
Epidural Patient location during procedure: OB Start time: 03/26/2021 6:47 PM End time: 03/26/2021 6:50 PM  Staffing Anesthesiologist: Leilani Able, MD Performed: anesthesiologist   Preanesthetic Checklist Completed: patient identified, IV checked, site marked, risks and benefits discussed, surgical consent, monitors and equipment checked, pre-op evaluation and timeout performed  Epidural Patient position: sitting Prep: DuraPrep and site prepped and draped Patient monitoring: continuous pulse ox and blood pressure Approach: midline Location: L3-L4 Injection technique: LOR air  Needle:  Needle type: Tuohy  Needle gauge: 17 G Needle length: 9 cm and 9 Needle insertion depth: 8 cm Catheter type: closed end flexible Catheter size: 19 Gauge Catheter at skin depth: 13 cm Test dose: negative and Other  Assessment Events: blood not aspirated, injection not painful, no injection resistance, no paresthesia and negative IV test  Additional Notes Reason for block:procedure for pain

## 2021-03-26 NOTE — Anesthesia Preprocedure Evaluation (Signed)
Anesthesia Evaluation  Patient identified by MRN, date of birth, ID band Patient awake    Reviewed: Allergy & Precautions, H&P , Patient's Chart, lab work & pertinent test results  Airway Mallampati: III       Dental no notable dental hx.    Pulmonary former smoker,    Pulmonary exam normal breath sounds clear to auscultation       Cardiovascular hypertension, negative cardio ROS Normal cardiovascular exam     Neuro/Psych negative neurological ROS  negative psych ROS   GI/Hepatic negative GI ROS, Neg liver ROS,   Endo/Other  negative endocrine ROSMorbid obesity  Renal/GU negative Renal ROS  negative genitourinary   Musculoskeletal negative musculoskeletal ROS (+)   Abdominal (+) + obese,   Peds  Hematology negative hematology ROS (+)   Anesthesia Other Findings   Reproductive/Obstetrics (+) Pregnancy                             Anesthesia Physical Anesthesia Plan  ASA: 3  Anesthesia Plan: Epidural   Post-op Pain Management:    Induction:   PONV Risk Score and Plan:   Airway Management Planned:   Additional Equipment:   Intra-op Plan:   Post-operative Plan:   Informed Consent: I have reviewed the patients History and Physical, chart, labs and discussed the procedure including the risks, benefits and alternatives for the proposed anesthesia with the patient or authorized representative who has indicated his/her understanding and acceptance.       Plan Discussed with:   Anesthesia Plan Comments:         Anesthesia Quick Evaluation

## 2021-03-26 NOTE — H&P (Signed)
Erin [name] is a 40 y.o. female presenting for IOL @ 39 3/[redacted] week gestation due to chronic HTN. Pt is not on any medication. OB History     Gravida  3   Para      Term      Preterm      AB  2   Living  0      SAB  2   IAB      Ectopic      Multiple      Live Births             Past Medical History:  Diagnosis Date  . Obesity   . PCOS (polycystic ovarian syndrome)   . PID (pelvic inflammatory disease)   . Ruptured ovarian cyst   . Vaginal Pap smear, abnormal    Past Surgical History:  Procedure Laterality Date  . DILATION AND CURETTAGE OF UTERUS    . LAPAROSCOPY     Family History: family history includes Asthma in her brother; Cancer in her maternal grandfather and maternal grandmother; Diabetes in her maternal grandfather; Stroke in her paternal grandfather. Social History:  reports that she has quit smoking. She has never used smokeless tobacco. She reports previous alcohol use. She reports that she does not use drugs.     Maternal Diabetes: No Genetic Screening: Normal Maternal Ultrasounds/Referrals: Normal Fetal Ultrasounds or other Referrals:  None Maternal Substance Abuse:  No Significant Maternal Medications:  Meds include: Other:  Significant Maternal Lab Results:  Group B Strep positive Other Comments:  None  Review of Systems  Eyes:  Negative for visual disturbance.  Cardiovascular:  Negative for leg swelling.  Neurological:  Negative for headaches.  History Dilation: 1 Effacement (%): 70 Station: -3 Exam by::  Cherly Hensen MD) Blood pressure (!) 152/101, pulse 100, temperature 98.3 F (36.8 C), temperature source Oral, resp. rate 18, height 5\' 3"  (1.6 m), weight 117.2 kg, last menstrual period 06/23/2020. Maternal Exam:  Introitus: Normal vulva.  Physical Exam Constitutional:      Appearance: Normal appearance.  Eyes:     Extraocular Movements: Extraocular movements intact.  Cardiovascular:     Rate and Rhythm: Regular rhythm.      Heart sounds: Normal heart sounds.  Pulmonary:     Breath sounds: Normal breath sounds.  Genitourinary:    General: Normal vulva.     Comments: Cervix 1/70/-3 vtx Musculoskeletal:     Cervical back: Neck supple.  Skin:    General: Skin is warm and dry.  Neurological:     General: No focal deficit present.     Mental Status: She is alert and oriented to person, place, and time.  Psychiatric:        Mood and Affect: Mood normal.        Behavior: Behavior normal.    Prenatal labs: ABO, Rh: B/Positive/-- (01/10 0000) Antibody: Negative (01/10 0000) Rubella: Immune (01/10 0000) RPR: Nonreactive (01/10 0000)  HBsAg: Negative (01/10 0000)  HIV: Non-reactive (01/10 0000)  GBS:   positive  Assessment/Plan: Chronic HTN in pregnancy, third trimester GBS cx (+) Term gestation P) admit PIH labs. IV PCN. IV pitocin. Intracervical balloon placement. Analgesic/epidural    Lonya Johannesen A Junnie Loschiavo 03/26/2021, 12:59 PM

## 2021-03-27 ENCOUNTER — Encounter (HOSPITAL_COMMUNITY): Payer: Self-pay | Admitting: Obstetrics and Gynecology

## 2021-03-27 LAB — COMPREHENSIVE METABOLIC PANEL
ALT: 11 U/L (ref 0–44)
AST: 25 U/L (ref 15–41)
Albumin: 2.2 g/dL — ABNORMAL LOW (ref 3.5–5.0)
Alkaline Phosphatase: 165 U/L — ABNORMAL HIGH (ref 38–126)
Anion gap: 8 (ref 5–15)
BUN: 11 mg/dL (ref 6–20)
CO2: 20 mmol/L — ABNORMAL LOW (ref 22–32)
Calcium: 8.6 mg/dL — ABNORMAL LOW (ref 8.9–10.3)
Chloride: 105 mmol/L (ref 98–111)
Creatinine, Ser: 1.05 mg/dL — ABNORMAL HIGH (ref 0.44–1.00)
GFR, Estimated: >60 mL/min (ref >60)
Glucose, Bld: 162 mg/dL — ABNORMAL HIGH (ref 70–99)
Potassium: 4.1 mmol/L (ref 3.5–5.1)
Sodium: 133 mmol/L — ABNORMAL LOW (ref 135–145)
Total Bilirubin: 0.4 mg/dL (ref 0.3–1.2)
Total Protein: 4.8 g/dL — ABNORMAL LOW (ref 6.5–8.1)

## 2021-03-27 LAB — RPR: RPR Ser Ql: NONREACTIVE

## 2021-03-27 MED ORDER — SIMETHICONE 80 MG PO CHEW: 80.0000 mg | CHEWABLE_TABLET | ORAL | Status: DC | PRN

## 2021-03-27 MED ORDER — HYDRALAZINE HCL 20 MG/ML IJ SOLN: 10.0000 mg | INTRAMUSCULAR | Status: DC | PRN

## 2021-03-27 MED ORDER — OXYCODONE HCL 5 MG PO TABS: 5.0000 mg | ORAL_TABLET | ORAL | Status: DC | PRN

## 2021-03-27 MED ORDER — IBUPROFEN 600 MG PO TABS
600.0000 mg | ORAL_TABLET | Freq: Four times a day (QID) | ORAL | Status: DC
  Administered 2021-03-27 – 2021-03-29 (×7): 600 mg via ORAL
  Filled 2021-03-27 (×7): qty 1

## 2021-03-27 MED ORDER — LABETALOL HCL 5 MG/ML IV SOLN: 40.0000 mg | INTRAVENOUS | Status: DC | PRN

## 2021-03-27 MED ORDER — DIBUCAINE (PERIANAL) 1 % EX OINT: 1.0000 "application " | TOPICAL_OINTMENT | CUTANEOUS | Status: DC | PRN

## 2021-03-27 MED ORDER — ZOLPIDEM TARTRATE 5 MG PO TABS: 5.0000 mg | ORAL_TABLET | Freq: Every evening | ORAL | Status: DC | PRN

## 2021-03-27 MED ORDER — WITCH HAZEL-GLYCERIN EX PADS: 1.0000 "application " | MEDICATED_PAD | CUTANEOUS | Status: DC | PRN

## 2021-03-27 MED ORDER — MISOPROSTOL 200 MCG PO TABS
800.0000 ug | ORAL_TABLET | Freq: Once | ORAL | Status: AC
  Administered 2021-03-27: 800 ug via RECTAL
  Filled 2021-03-27: qty 4

## 2021-03-27 MED ORDER — LABETALOL HCL 5 MG/ML IV SOLN: 20.0000 mg | INTRAVENOUS | Status: DC | PRN

## 2021-03-27 MED ORDER — DIPHENHYDRAMINE HCL 25 MG PO CAPS: 25.0000 mg | ORAL_CAPSULE | Freq: Four times a day (QID) | ORAL | Status: DC | PRN

## 2021-03-27 MED ORDER — PRENATAL MULTIVITAMIN CH
1.0000 | ORAL_TABLET | Freq: Every day | ORAL | Status: DC
  Administered 2021-03-28: 1 via ORAL
  Filled 2021-03-27: qty 1

## 2021-03-27 MED ORDER — ACETAMINOPHEN 325 MG PO TABS
650.0000 mg | ORAL_TABLET | ORAL | Status: DC | PRN
  Administered 2021-03-28 – 2021-03-29 (×2): 650 mg via ORAL
  Filled 2021-03-27 (×2): qty 2

## 2021-03-27 MED ORDER — BENZOCAINE-MENTHOL 20-0.5 % EX AERO: 1.0000 "application " | INHALATION_SPRAY | CUTANEOUS | Status: DC | PRN

## 2021-03-27 MED ORDER — LABETALOL HCL 100 MG PO TABS
100.0000 mg | ORAL_TABLET | Freq: Two times a day (BID) | ORAL | Status: DC
  Administered 2021-03-27 – 2021-03-28 (×3): 100 mg via ORAL
  Filled 2021-03-27 (×3): qty 1

## 2021-03-27 MED ORDER — SENNOSIDES-DOCUSATE SODIUM 8.6-50 MG PO TABS
2.0000 | ORAL_TABLET | Freq: Every day | ORAL | Status: DC
  Administered 2021-03-28: 2 via ORAL
  Filled 2021-03-27: qty 2

## 2021-03-27 MED ORDER — LABETALOL HCL 5 MG/ML IV SOLN: 80.0000 mg | INTRAVENOUS | Status: DC | PRN

## 2021-03-27 MED ORDER — FERROUS SULFATE 325 (65 FE) MG PO TABS
325.0000 mg | ORAL_TABLET | Freq: Two times a day (BID) | ORAL | Status: DC
  Administered 2021-03-28 (×2): 325 mg via ORAL
  Filled 2021-03-27 (×2): qty 1

## 2021-03-27 MED ORDER — OXYCODONE HCL 5 MG PO TABS: 10.0000 mg | ORAL_TABLET | ORAL | Status: DC | PRN

## 2021-03-27 MED ORDER — ONDANSETRON HCL 4 MG/2ML IJ SOLN: 4.0000 mg | INTRAMUSCULAR | Status: DC | PRN

## 2021-03-27 MED ORDER — ONDANSETRON HCL 4 MG PO TABS: 4.0000 mg | ORAL_TABLET | ORAL | Status: DC | PRN

## 2021-03-27 MED ORDER — COCONUT OIL OIL: 1.0000 "application " | TOPICAL_OIL | Status: DC | PRN

## 2021-03-27 NOTE — Progress Notes (Addendum)
On 1 hour reassessment, pt was having increased abdominal cramping, uterus 2/U and firm with trickle of blood. Assisted pt up to void, while voiding pt became diaphoretic and faint. Several clots expelled into the commode and uterine massage performed.  Unable to note size of clots due to moderate amount of blood in commode.  Returned to bed with assistance and without incidence.  Uterus recheck when back in bed was U/E and firm with small amount of blood.  Pt BP continued to remain elevated with BP = 142/106, no preeclampsia signs or symptoms at this time.  Called Dr Cherly Hensen to notify of incident, currently in OR, awaiting call back.

## 2021-03-27 NOTE — Progress Notes (Signed)
Called regarding passage of clots earlier. Reportedly bleeding better/ pt had become light headed with getting up. Bimanual exam. Small clots removed. Cervix parous palp clot at os. Will administer rectal cytotec ( ). Labs ordered for [facility] and pt to start on labetolol

## 2021-03-27 NOTE — Progress Notes (Signed)
Erin [name] is a 40 y.o. G3P0020 at [redacted]w[redacted]d by LMP admitted for induction of labor due to Hypertension.  Subjective: No chief complaint on file.  Denies rectal pressure Objective: BP 119/79   Pulse 82   Temp 98.4 F (36.9 C) (Axillary)   Resp 17   Ht 5\' 3"  (1.6 m)   Wt 117.2 kg   LMP 06/23/2020   SpO2 99%   BMI 45.76 kg/m  I/O last 3 completed shifts: In: 1582 [I.V.:1582] Out: -  Total I/O In: -  Out: 600 [Urine:600]  FHT:  FHR: 150 bpm, variability: moderate,  accelerations:  Present,  decelerations:  Absent UC:   irregular, every 2-3 minutes SVE:   10 cm dilated, 100% effaced, +1 station Tracing: cat 1  Labs: Lab Results  Component Value Date   WBC 5.8 03/26/2021   HGB 11.9 (L) 03/26/2021   HCT 35.6 (L) 03/26/2021   MCV 84.0 03/26/2021   PLT 229 03/26/2021    Assessment / Plan: complete   Chronic HTN in third trimester Term gestation GBS cx positive on IV PCN P) labor vtx down   Anticipated MOD:  NSVD  Rayne Cowdrey A Kino Dunsworth 03/27/2021, 1:32 PM

## 2021-03-27 NOTE — Progress Notes (Signed)
S: no complaint  O: BP 108/72   Pulse 83   Temp 98.1 F (36.7 C)   Resp 18   Ht 5\' 3"  (1.6 m)   Wt 117.2 kg   LMP 06/23/2020   SpO2 99%   BMI 45.76 kg/m  Pitocin 18 miu VE 5/80/-2 ROP AROM clear fluid IUPC/ISE placed  Tracing: baseline 150 (+) early decel Ctx q 2  1/2 mins  IMP: active phase Chronic HTN in third trimester GBS cx (+) on IV PCN Term gestation P) right reverse cow girl. Cont pitocin

## 2021-03-27 NOTE — Lactation Note (Signed)
This note was copied from a baby's chart. Lactation Consultation Note  Patient Name: Erin [name] [id number]'F Date: 03/27/2021 Reason for consult: L&D Initial assessment;Primapara;1st time breastfeeding;Term;Other (Comment) (LC visit at 30 mins PP . Baby STS with mom on her chest. mom receptive to assistance / baby latched 1st time 5-6 sucks x 2 / no swallows) 2nd Latch baby latched for 12 mins with Latch score of 7 - see below. / per mom comfortable. Age:38 mins old .  Mom aware she will be seen by [facility] again today when on MBU  Maternal Data Mom has a hx of PCOS - per mom dx 8 years ago, reports several breast changes during pregnancy / except breast size change.  LC noted the breast to be wide spaced / areolas compressible for latching.  Does the patient have breastfeeding experience prior to this delivery?: No  Feeding Mother's Current Feeding Choice: Breast Milk  LATCH Score Latch: Grasps breast easily, tongue down, lips flanged, rhythmical sucking.  Audible Swallowing: None  Type of Nipple: Everted at rest and after stimulation  Comfort (Breast/Nipple): Soft / non-tender  Hold (Positioning): Assistance needed to correctly position infant at breast and maintain latch.  LATCH Score: 7   Lactation Tools Discussed/Used    Interventions Interventions: Breast feeding basics reviewed;Assisted with latch;Skin to skin;Hand express;Adjust position;Support pillows;Education  Discharge    Consult Status Consult Status: Follow-up (from LD) Date: 03/27/21 Follow-up type: In-patient    Erin [name] 03/27/2021, 4:09 PM

## 2021-03-28 LAB — CBC
HCT: 26.8 % — ABNORMAL LOW (ref 36.0–46.0)
Hemoglobin: 8.7 g/dL — ABNORMAL LOW (ref 12.0–15.0)
MCH: 27.9 pg (ref 26.0–34.0)
MCHC: 32.5 g/dL (ref 30.0–36.0)
MCV: 85.9 fL (ref 80.0–100.0)
Platelets: 185 10*3/uL (ref 150–400)
RBC: 3.12 MIL/uL — ABNORMAL LOW (ref 3.87–5.11)
RDW: 14 % (ref 11.5–15.5)
WBC: 13.7 10*3/uL — ABNORMAL HIGH (ref 4.0–10.5)
nRBC: 0 % (ref 0.0–0.2)

## 2021-03-28 NOTE — Anesthesia Postprocedure Evaluation (Signed)
Anesthesia Post Note  Patient: Erin [name]  Procedure(s) Performed: AN AD HOC LABOR EPIDURAL     Patient location during evaluation: Mother Baby Anesthesia Type: Epidural Level of consciousness: awake Pain management: satisfactory to patient Vital Signs Assessment: post-procedure vital signs reviewed and stable Respiratory status: spontaneous breathing Cardiovascular status: stable Anesthetic complications: no   No notable events documented.  Last Vitals:  Vitals:   03/28/21 0230 03/28/21 0558  BP: 127/77 140/80  Pulse: 90 85  Resp: 16 18  Temp: 36.7 C 36.8 C  SpO2:      Last Pain:  Vitals:   03/28/21 0602  TempSrc:   PainSc: 0-No pain   Pain Goal:                   [employer]

## 2021-03-28 NOTE — Lactation Note (Signed)
This note was copied from a baby's chart. Lactation Consultation Note  Patient Name: Boy Nakyiah Kuck EHUDJ'S Date: 03/28/2021 Reason for consult: Follow-up assessment;1st time breastfeeding;Primapara;Term Age:38 hours  P1 mother whose infant is now 96 hours old.  This is a term baby at 39+4 weeks.  Baby had just finished getting his bath when I arrived.  Mother was holding him STS.  Reviewed breast feeding basics.  Mother reported that he is latching and feeding well.  Discussed the possibility of cluster feeding tonight.  She stated that he is showing cues and actively feeding with intermittent stimulation.  Mother denies pain with latching.  She informed me that she would like to continue feeding without any lactation assistance so she can practice and be prepared for discharge tomorrow.  Acknowledged mother's request and informed her that lactation services will be available if desired.  Mother appreciative.  She will continue to feed on cue or at least 8-12 times/24 hours.  Mother may call for lactation to observe flange size with her own personal pump.  Support person present.  RN updated.   Maternal Data Has patient been taught Hand Expression?: Yes Does the patient have breastfeeding experience prior to this delivery?: No  Feeding Mother's Current Feeding Choice: Breast Milk  LATCH Score                    Lactation Tools Discussed/Used    Interventions Interventions: Education  Discharge Pump: Personal  Consult Status Consult Status: Follow-up Date: 03/29/21 Follow-up type: In-patient    Dora Sims 03/28/2021, 12:28 PM

## 2021-03-28 NOTE — Lactation Note (Signed)
This note was copied from a baby's chart. Lactation Consultation Note  Patient Name: Erin [name] TUUEK'C Date: 03/28/2021 Reason for consult: Initial assessment;Primapara;Term;Maternal endocrine disorder Age:38 hours  Mom stated it was time to BF. LC woke baby for feeding.  Discussed positioning, support, props, safety, STS, I&O, newborn feeding habits, supply and demand.  Mom has flat very compressible breast tissue.  Hand expression taught w/colostrum noted. Mom does have tubular breast and PCOS.   Took several times to get baby to open wide enough for deep latch. Needed top lip flanged several times. Baby bobbing head up and down while feeding and with upper lip tight, may have lip tie. LC didn't assess oral anatomy.   Answered questions. Praised mom for doing a good job. Encouraged mom to relax. Body was tense. Lactation brochure given. Encouraged to call for assistance or questions.  Maternal Data Has patient been taught Hand Expression?: Yes Does the patient have breastfeeding experience prior to this delivery?: No  Feeding    LATCH Score Latch: Grasps breast easily, tongue down, lips flanged, rhythmical sucking.  Audible Swallowing: A few with stimulation  Type of Nipple: Flat  Comfort (Breast/Nipple): Soft / non-tender  Hold (Positioning): Assistance needed to correctly position infant at breast and maintain latch.  LATCH Score: 7   Lactation Tools Discussed/Used    Interventions Interventions: Breast feeding basics reviewed;Support pillows;Assisted with latch;Position options;Skin to skin;Breast massage;Hand express;Breast compression;Adjust position  Discharge [facility] Program: No  Consult Status Consult Status: Follow-up Date: 03/29/21 Follow-up type: In-patient    Charyl Dancer 03/28/2021, 5:38 AM

## 2021-03-28 NOTE — Progress Notes (Signed)
PPD1 SVD:   S:  Pt reports feeling well/ Tolerating po/ Voiding without problems/ No n/v/ Bleeding is light/ Pain controlled withprescription NSAID's including ibuprofen (Motrin)  Newborn info live female  BRF   O:  A & O x 3 / VS: Blood pressure 140/80, pulse 85, temperature 98.2 F (36.8 C), temperature source Oral, resp. rate 18, height 5\' 3"  (1.6 m), weight 117.2 kg, last menstrual period 06/23/2020, SpO2 100 %, unknown if currently breastfeeding.  LABS:  Results for orders placed or performed during the hospital encounter of 03/26/21 (from the past 24 hour(s))  Comprehensive metabolic panel     Status: Abnormal   Collection Time: 03/27/21  8:00 PM  Result Value Ref Range   Sodium 133 (L) 135 - 145 mmol/L   Potassium 4.1 3.5 - 5.1 mmol/L   Chloride 105 98 - 111 mmol/L   CO2 20 (L) 22 - 32 mmol/L   Glucose, Bld 162 (H) 70 - 99 mg/dL   BUN 11 6 - 20 mg/dL   Creatinine, Ser 05/27/21 (H) 0.44 - 1.00 mg/dL   Calcium 8.6 (L) 8.9 - 10.3 mg/dL   Total Protein 4.8 (L) 6.5 - 8.1 g/dL   Albumin 2.2 (L) 3.5 - 5.0 g/dL   AST 25 15 - 41 U/L   ALT 11 0 - 44 U/L   Alkaline Phosphatase 165 (H) 38 - 126 U/L   Total Bilirubin 0.4 0.3 - 1.2 mg/dL   GFR, Estimated 5.00 >93 mL/min   Anion gap 8 5 - 15  CBC     Status: Abnormal   Collection Time: 03/28/21  5:55 AM  Result Value Ref Range   WBC 13.7 (H) 4.0 - 10.5 K/uL   RBC 3.12 (L) 3.87 - 5.11 MIL/uL   Hemoglobin 8.7 (L) 12.0 - 15.0 g/dL   HCT 05/28/21 (L) 82.9 - 93.7 %   MCV 85.9 80.0 - 100.0 fL   MCH 27.9 26.0 - 34.0 pg   MCHC 32.5 30.0 - 36.0 g/dL   RDW 16.9 67.8 - 93.8 %   Platelets 185 150 - 400 K/uL   nRBC 0.0 0.0 - 0.2 %    I&O: I/O last 3 completed shifts: In: 1582 [I.V.:1582] Out: 1650 [Urine:1300; Blood:350]   No intake/output data recorded.  Lungs: chest clear, no wheezing, rales, normal symmetric air entry  Heart: regular rate and rhythm, S1, S2 normal, no murmur, click, rub or gallop  Abdomen: obese soft uterus @ umb firm  Perineum:  healing with good reapproximation  Lochia: light  Extremities:no redness or tenderness in the calves or thighs, edema tr+    A/P: PPD # 1/ 1583  Doing well  Continue routine post partum orders  Anticipate d/c in am

## 2021-03-29 MED ORDER — IBUPROFEN 600 MG PO TABS: 600.0000 mg | ORAL_TABLET | Freq: Four times a day (QID) | ORAL | 11 refills | Status: DC | PRN

## 2021-03-29 MED ORDER — FERROUS SULFATE 325 (65 FE) MG PO TABS
325.0000 mg | ORAL_TABLET | Freq: Three times a day (TID) | ORAL | 3 refills | Status: DC
Start: 2021-03-29 — End: 2022-06-12

## 2021-03-29 MED ORDER — LABETALOL HCL 100 MG PO TABS: 100.0000 mg | ORAL_TABLET | Freq: Two times a day (BID) | ORAL | 3 refills | Status: DC

## 2021-03-29 NOTE — Lactation Note (Signed)
This note was copied from a baby's chart. Lactation Consultation Note  Patient Name: Erin [name] [id number] Date: 03/29/2021 Reason for consult: Follow-up assessment;1st time breastfeeding;Primapara;Term Age:38 hours   P1 mother whose infant is now 23 hours old.  This is a term baby at 39+4 weeks.  Mother had no questions/concerns related to breast feeding. Baby is feeding, voiding, stooling well.  Mother has provided some formula supplementation per her desire. Reviewed engorgement prevention/treatment.  Manual pump with instructions for use provided.  #27 flange given for home use.    Mother has our OP phone number for any further questions/concerns.  Father present.  Family is ready for discharge.   Maternal Data Has patient been taught Hand Expression?: Yes Does the patient have breastfeeding experience prior to this delivery?: No  Feeding Mother's Current Feeding Choice: Breast Milk Nipple Type: Slow - flow  LATCH Score                    Lactation Tools Discussed/Used    Interventions Interventions: Education  Discharge Discharge Education: Engorgement and breast care Pump: Personal;Manual WIC Program: No  Consult Status Consult Status: Complete Date: 03/29/21 Follow-up type: Call as needed    Erin [name] 03/29/2021, 10:16 AM

## 2021-03-29 NOTE — Discharge Instructions (Signed)
Call if temperature greater than equal to 100.4, nothing per vagina for 4-6 weeks or severe nausea vomiting, increased incisional pain , drainage or redness in the incision site, no straining with bowel movements, showers no bathCall if temperature greater than equal to 100.4, nothing per vagina for 4-6 weeks or severe nausea vomiting, increased incisional pain , drainage or redness in the incision site, no straining with bowel movements, showers no bath 

## 2021-03-29 NOTE — Discharge Summary (Signed)
Postpartum Discharge Summary  Date of Service updated     Patient Name: Erin [name] DOB: Apr 17, 1981 MRN: [id number]  Date of admission: 03/26/2021 Delivery date:03/27/2021  Delivering provider: Lyzbeth Genrich  Date of discharge: 03/29/2021  Admitting diagnosis: HTN in pregnancy, chronic [O10.919] Postpartum care following vaginal delivery [Z39.2] Intrauterine pregnancy: [redacted]w[redacted]d    Secondary diagnosis:  Principal Problem:   Chronic hypertension complicating or reason for care during pregnancy, third trimester Active Problems:   HTN in pregnancy, chronic   Postpartum care following vaginal delivery  Additional problems: ama, GBS cx positive    Discharge diagnosis: Term Pregnancy Delivered, CHTN, and Anemia                                              Post partum procedures: none Augmentation: AROM Complications: None  Hospital course: Induction of Labor With Vaginal Delivery   40y.o. yo [id number] admitted to the hospital 03/26/2021 for induction of labor.  Indication for induction:  chronic HTN .  Patient had an uncomplicated labor course as follows: pitocin induction with IP foley balloon Membrane Rupture Time/Date: 4:24 AM ,03/27/2021   Delivery Method:Vaginal, Spontaneous  Episiotomy: None  Lacerations: right  Vaginal;Sulcus , subclitoral Details of delivery can be found in separate delivery note.  Patient had a routine postpartum course. Patient is discharged home 03/29/21.  Newborn Data: Birth date:03/27/2021  Birth time:3:09 PM  Gender:Female  Living status:Living  Apgars:9 ,9  Weight:3544 g   Magnesium Sulfate received: No BMZ received: No Rhophylac:No MMR:No T-DaP:Given prenatally Flu: No Transfusion:No  Physical exam  Vitals:   03/28/21 0558 03/28/21 1408 03/28/21 1945 03/29/21 0500  BP: 140/80 117/71 139/80 139/80  Pulse: 85 92 97 89  Resp: _0 Temp: 98.2 F (36.8 C) 98 F (36.7 C) 99 F (37.2 C) 98.3 F (36.8 C)  TempSrc: Oral  Oral Oral Oral  SpO2:  99% 97%   Weight:      Height:       General: alert, cooperative, and no distress Lochia: appropriate Uterine Fundus: firm Incision: N/A DVT Evaluation: No evidence of DVT seen on physical exam. Calf/Ankle edema is present Labs: Lab Results  Component Value Date   WBC 13.7 (H) 03/28/2021   HGB 8.7 (L) 03/28/2021   HCT 26.8 (L) 03/28/2021   MCV 85.9 03/28/2021   PLT 185 03/28/2021   CMP Latest Ref Rng & Units 03/27/2021  Glucose 70 - 99 mg/dL 162(H)  BUN 6 - 20 mg/dL 11  Creatinine 0.44 - 1.00 mg/dL 1.05(H)  Sodium 135 - 145 mmol/L 133(L)  Potassium 3.5 - 5.1 mmol/L 4.1  Chloride 98 - 111 mmol/L 105  CO2 22 - 32 mmol/L 20(L)  Calcium 8.9 - 10.3 mg/dL 8.6(L)  Total Protein 6.5 - 8.1 g/dL 4.8(L)  Total Bilirubin 0.3 - 1.2 mg/dL 0.4  Alkaline Phos 38 - 126 U/L 165(H)  AST 15 - 41 U/L 25  ALT 0 - 44 U/L 11   Edinburgh Score: Edinburgh Postnatal Depression Scale Screening Tool 03/28/2021  I have been able to laugh and see the funny side of things. 0  I have looked forward with enjoyment to things. 0  I have blamed myself unnecessarily when things went wrong. 1  I have been anxious or worried for no good reason. 2  I have  felt scared or panicky for no good reason. 0  Things have been getting on top of me. 1  I have been so unhappy that I have had difficulty sleeping. 0  I have felt sad or miserable. 0  I have been so unhappy that I have been crying. 0  The thought of harming myself has occurred to me. 0  Edinburgh Postnatal Depression Scale Total 4      After visit meds:  Allergies as of 03/29/2021   No Known Allergies      Medication List     TAKE these medications    ferrous sulfate 325 (65 FE) MG tablet Take 1 tablet (325 mg total) by mouth 3 (three) times daily with meals.   ibuprofen 600 MG tablet Commonly known as: ADVIL Take 1 tablet (600 mg total) by mouth every 6 (six) hours as needed.   labetalol 100 MG tablet Commonly known  as: NORMODYNE Take 1 tablet (100 mg total) by mouth 2 (two) times daily.   prenatal multivitamin Tabs tablet Take 1 tablet by mouth daily at 12 noon.         Discharge home in stable condition Infant Feeding: Breast Infant Disposition:home with mother Discharge instruction: per After Visit Summary and Postpartum booklet. Activity: Advance as tolerated. Pelvic rest for 6 weeks.  Diet: low salt diet Anticipated Birth Control:  none Postpartum Appointment:1 week Additional Postpartum F/U: BP check 1 week Future Appointments:No future appointments. Follow up Visit:  Follow-up Information     Servando Salina, MD Follow up in 6 week(s).   Specialty: Obstetrics and Gynecology Contact information: [place] [address] [phone]         Servando Salina, MD Follow up in 1 week(s).   Specialty: Obstetrics and Gynecology Why: BP check Contact information: [address] [place] [address] [phone]                     03/29/2021 Marvene Staff, MD

## 2021-04-01 ENCOUNTER — Encounter (HOSPITAL_COMMUNITY): Payer: Self-pay | Admitting: Obstetrics and Gynecology

## 2021-04-01 ENCOUNTER — Inpatient Hospital Stay (HOSPITAL_COMMUNITY)
Admission: AD | Admit: 2021-04-01 | Discharge: 2021-04-01 | Disposition: A | Discharge status: Home / Self Care | Payer: BC Managed Care – PPO | Attending: Obstetrics and Gynecology | Admitting: Obstetrics and Gynecology

## 2021-04-01 ENCOUNTER — Other Ambulatory Visit: Payer: Self-pay

## 2021-04-01 DIAGNOSIS — O1003 Pre-existing essential hypertension complicating the puerperium: Secondary | ICD-10-CM

## 2021-04-01 DIAGNOSIS — O135 Gestational [pregnancy-induced] hypertension without significant proteinuria, complicating the puerperium: Secondary | ICD-10-CM | POA: Diagnosis present

## 2021-04-01 LAB — CBC WITH DIFFERENTIAL/PLATELET
Abs Immature Granulocytes: 0.05 10*3/uL (ref 0.00–0.07)
Basophils Absolute: 0 10*3/uL (ref 0.0–0.1)
Basophils Relative: 0 %
Eosinophils Absolute: 0.2 10*3/uL (ref 0.0–0.5)
Eosinophils Relative: 2 %
HCT: 26.4 % — ABNORMAL LOW (ref 36.0–46.0)
Hemoglobin: 8.6 g/dL — ABNORMAL LOW (ref 12.0–15.0)
Immature Granulocytes: 1 %
Lymphocytes Relative: 30 %
Lymphs Abs: 2.5 10*3/uL (ref 0.7–4.0)
MCH: 28.5 pg (ref 26.0–34.0)
MCHC: 32.6 g/dL (ref 30.0–36.0)
MCV: 87.4 fL (ref 80.0–100.0)
Monocytes Absolute: 0.6 10*3/uL (ref 0.1–1.0)
Monocytes Relative: 7 %
Neutro Abs: 4.9 10*3/uL (ref 1.7–7.7)
Neutrophils Relative %: 60 %
Platelets: 283 10*3/uL (ref 150–400)
RBC: 3.02 MIL/uL — ABNORMAL LOW (ref 3.87–5.11)
RDW: 15 % (ref 11.5–15.5)
WBC: 8.2 10*3/uL (ref 4.0–10.5)
nRBC: 0 % (ref 0.0–0.2)

## 2021-04-01 LAB — COMPREHENSIVE METABOLIC PANEL
ALT: 34 U/L (ref 0–44)
AST: 49 U/L — ABNORMAL HIGH (ref 15–41)
Albumin: 2.9 g/dL — ABNORMAL LOW (ref 3.5–5.0)
Alkaline Phosphatase: 99 U/L (ref 38–126)
Anion gap: 7 (ref 5–15)
BUN: 11 mg/dL (ref 6–20)
CO2: 26 mmol/L (ref 22–32)
Calcium: 8.8 mg/dL — ABNORMAL LOW (ref 8.9–10.3)
Chloride: 107 mmol/L (ref 98–111)
Creatinine, Ser: 0.72 mg/dL (ref 0.44–1.00)
GFR, Estimated: >60 mL/min (ref >60)
Glucose, Bld: 87 mg/dL (ref 70–99)
Potassium: 3.9 mmol/L (ref 3.5–5.1)
Sodium: 140 mmol/L (ref 135–145)
Total Bilirubin: 0.1 mg/dL — ABNORMAL LOW (ref 0.3–1.2)
Total Protein: 6.4 g/dL — ABNORMAL LOW (ref 6.5–8.1)

## 2021-04-01 LAB — PROTEIN / CREATININE RATIO, URINE
Creatinine, Urine: 180.11 mg/dL
Protein Creatinine Ratio: 0.43 mg/mg{Cre} — ABNORMAL HIGH (ref 0.00–0.15)
Total Protein, Urine: 78 mg/dL

## 2021-04-01 MED ORDER — LABETALOL HCL 100 MG PO TABS
200.0000 mg | ORAL_TABLET | Freq: Once | ORAL | Status: AC
  Administered 2021-04-01: 200 mg via ORAL
  Filled 2021-04-01: qty 2

## 2021-04-01 MED ORDER — HYDROCHLOROTHIAZIDE 12.5 MG PO CAPS: 12.5000 mg | ORAL_CAPSULE | Freq: Every day | ORAL | 0 refills | Status: DC

## 2021-04-01 MED ORDER — LABETALOL HCL 200 MG PO TABS: 200.0000 mg | ORAL_TABLET | Freq: Two times a day (BID) | ORAL | 0 refills | Status: DC

## 2021-04-01 MED ORDER — BUTALBITAL-APAP-CAFFEINE 50-325-40 MG PO TABS
2.0000 | ORAL_TABLET | Freq: Once | ORAL | Status: AC
  Administered 2021-04-01: 2 via ORAL
  Filled 2021-04-01: qty 2

## 2021-04-01 NOTE — MAU Note (Signed)
Dr Cherly Hensen called the unit regarding patient- verbal orders received for CBC, CMP, and PCR.

## 2021-04-01 NOTE — MAU Provider Note (Signed)
History     Cc; elevated BP, h/a, leg swelling HPI. 40 yo G3P1021 MBF s/p vag delivery 8/4 after IOL for HTN sent from the  Office  for lab work due to c/o increased leg swelling, elevated BP ( diastolic's in 100's)despite labetalol Pt also c/o mild headache. Denies visual changes.    OB History     Gravida  3   Para  1   Term  1   Preterm      AB  2   Living  1      SAB  2   IAB      Ectopic      Multiple  0   Live Births  1           Past Medical History:  Diagnosis Date   Obesity    PCOS (polycystic ovarian syndrome)    PID (pelvic inflammatory disease)    Ruptured ovarian cyst    Vaginal Pap smear, abnormal     Past Surgical History:  Procedure Laterality Date   DILATION AND CURETTAGE OF UTERUS     LAPAROSCOPY      Family History  Problem Relation Age of Onset   Asthma Brother    Cancer Maternal Grandmother    Cancer Maternal Grandfather    Diabetes Maternal Grandfather    Stroke Paternal Grandfather     Social History   Tobacco Use   Smoking status: Former   Smokeless tobacco: Never  Building services engineer Use: Never used  Substance Use Topics   Alcohol use: Not Currently    Comment: occ   Drug use: Never    Allergies: No Known Allergies  Medications Prior to Admission  Medication Sig Dispense Refill Last Dose   ferrous sulfate 325 (65 FE) MG tablet Take 1 tablet (325 mg total) by mouth 3 (three) times daily with meals. 90 tablet 3 04/01/2021   ibuprofen (ADVIL) 600 MG tablet Take 1 tablet (600 mg total) by mouth every 6 (six) hours as needed. 30 tablet 11 04/01/2021   labetalol (NORMODYNE) 100 MG tablet Take 1 tablet (100 mg total) by mouth 2 (two) times daily. 60 tablet 3 04/01/2021   Prenatal Vit-Fe Fumarate-FA (PRENATAL MULTIVITAMIN) TABS tablet Take 1 tablet by mouth daily at 12 noon.   03/30/2021     Physical Exam   Blood pressure (!) 157/97, pulse 82, temperature 98.1 F (36.7 C), temperature source Oral, resp. rate 18,  height 5\' 3"  (1.6 m), weight 110.3 kg, SpO2 100 %, unknown if currently breastfeeding.  No exam performed today,  done in office . CBC Latest Ref Rng & Units 04/01/2021 03/28/2021 03/26/2021  WBC 4.0 - 10.5 K/uL 8.2 13.7(H) 5.8  Hemoglobin 12.0 - 15.0 g/dL 05/26/2021) 1.6(W) 11.9(L)  Hematocrit 36.0 - 46.0 % 26.4(L) 26.8(L) 35.6(L)  Platelets 150 - 400 K/uL 283 185 229    CMP Latest Ref Rng & Units 04/01/2021 03/27/2021 03/26/2021  Glucose 70 - 99 mg/dL 87 05/26/2021) 99  BUN 6 - 20 mg/dL 11 11 11   Creatinine 0.44 - 1.00 mg/dL 106(Y ) 6.94  Sodium 135 - 145 mmol/L 140 133(L) 135  Potassium 3.5 - 5.1 mmol/L 3.9 4.1 4.1  Chloride 98 - 111 mmol/L 107 105 105  CO2 22 - 32 mmol/L 26 20(L) 21(L)  Calcium 8.9 - 10.3 mg/dL 8.54(O) 2.70) 9.1  Total Protein 6.5 - 8.1 g/dL 6.4(L) 4.8(L) 6.1(L)  Total Bilirubin 0.3 - 1.2 mg/dL 3.5(K) 0.4 0.8  Alkaline Phos  38 - 126 U/L 99 165(H) 213(H)  AST 15 - 41 U/L 49(H) 25 24  ALT 0 - 44 U/L 34 11 12   ED Course  IMP: chronic HTN  postpartum with no evidence of superimposed preeclampsia P) increase labetalol to 200mg  po bid. One dose given here today F/u 1 wk to recheck BP MDM   , MD 7:45 PM 04/01/2021

## 2021-04-01 NOTE — MAU Note (Addendum)
Presents for PP BP evaluation.  Reports took BP @ [facility] BP's1 55/100, 171/103/, and 161/101.  Seen @ Dr. Purnell Shoemaker office today and BP 148/96.  Endorses H/A, denies visual disturbances and epigastric pain. S/P NVD 03/27/2021

## 2021-04-10 ENCOUNTER — Telehealth (HOSPITAL_COMMUNITY): Payer: Self-pay | Admitting: *Deleted

## 2021-04-10 NOTE — Telephone Encounter (Signed)
Mom reports doing well. No concerns about herself. EPDS = 9 (Hosp score =4). Discussed with mom about notifying physician if anxiety or depression doesn't subside. Mom agreed. Mom reports baby doing well. Eating, peeing, and pooping well. Sleeps on bassinet in mom's room on back. No concerns about baby.  Duffy Rhody, RN 04-10-2021 at 10:55am

## 2021-04-15 ENCOUNTER — Encounter (HOSPITAL_COMMUNITY): Payer: Self-pay | Admitting: Obstetrics and Gynecology

## 2021-04-15 ENCOUNTER — Inpatient Hospital Stay (HOSPITAL_COMMUNITY)
Admission: AD | Admit: 2021-04-15 | Discharge: 2021-04-16 | Disposition: A | Discharge status: Home / Self Care | Payer: BC Managed Care – PPO | Attending: Obstetrics and Gynecology | Admitting: Obstetrics and Gynecology

## 2021-04-15 ENCOUNTER — Inpatient Hospital Stay (HOSPITAL_COMMUNITY): Payer: BC Managed Care – PPO

## 2021-04-15 DIAGNOSIS — O1003 Pre-existing essential hypertension complicating the puerperium: Secondary | ICD-10-CM

## 2021-04-15 DIAGNOSIS — Z87891 Personal history of nicotine dependence: Secondary | ICD-10-CM | POA: Diagnosis not present

## 2021-04-15 DIAGNOSIS — Z79899 Other long term (current) drug therapy: Secondary | ICD-10-CM | POA: Insufficient documentation

## 2021-04-15 DIAGNOSIS — O1093 Unspecified pre-existing hypertension complicating the puerperium: Secondary | ICD-10-CM | POA: Insufficient documentation

## 2021-04-15 DIAGNOSIS — R519 Headache, unspecified: Secondary | ICD-10-CM | POA: Insufficient documentation

## 2021-04-15 LAB — URINALYSIS, ROUTINE W REFLEX MICROSCOPIC
Bilirubin Urine: NEGATIVE
Glucose, UA: NEGATIVE mg/dL
Ketones, ur: NEGATIVE mg/dL
Nitrite: NEGATIVE
Protein, ur: NEGATIVE mg/dL
RBC / HPF: >50 RBC/hpf — ABNORMAL HIGH (ref 0–5)
Specific Gravity, Urine: 1.013 (ref 1.005–1.030)
pH: 5 (ref 5.0–8.0)

## 2021-04-15 LAB — CBC
HCT: 31.6 % — ABNORMAL LOW (ref 36.0–46.0)
Hemoglobin: 10.3 g/dL — ABNORMAL LOW (ref 12.0–15.0)
MCH: 28.2 pg (ref 26.0–34.0)
MCHC: 32.6 g/dL (ref 30.0–36.0)
MCV: 86.6 fL (ref 80.0–100.0)
Platelets: 265 10*3/uL (ref 150–400)
RBC: 3.65 MIL/uL — ABNORMAL LOW (ref 3.87–5.11)
RDW: 14.6 % (ref 11.5–15.5)
WBC: 4.8 10*3/uL (ref 4.0–10.5)
nRBC: 0 % (ref 0.0–0.2)

## 2021-04-15 LAB — COMPREHENSIVE METABOLIC PANEL
ALT: 15 U/L (ref 0–44)
AST: 19 U/L (ref 15–41)
Albumin: 3.3 g/dL — ABNORMAL LOW (ref 3.5–5.0)
Alkaline Phosphatase: 68 U/L (ref 38–126)
Anion gap: 5 (ref 5–15)
BUN: 12 mg/dL (ref 6–20)
CO2: 28 mmol/L (ref 22–32)
Calcium: 8.8 mg/dL — ABNORMAL LOW (ref 8.9–10.3)
Chloride: 106 mmol/L (ref 98–111)
Creatinine, Ser: 1 mg/dL (ref 0.44–1.00)
GFR, Estimated: >60 mL/min (ref >60)
Glucose, Bld: 90 mg/dL (ref 70–99)
Potassium: 4 mmol/L (ref 3.5–5.1)
Sodium: 139 mmol/L (ref 135–145)
Total Bilirubin: 0.3 mg/dL (ref 0.3–1.2)
Total Protein: 6.5 g/dL (ref 6.5–8.1)

## 2021-04-15 LAB — PROTEIN / CREATININE RATIO, URINE
Creatinine, Urine: 149.49 mg/dL
Protein Creatinine Ratio: 0.05 mg/mg{Cre} (ref 0.00–0.15)
Total Protein, Urine: 8 mg/dL

## 2021-04-15 IMAGING — MR MR MRA HEAD W/O CM
1 series · 23 of 48 positions shown · non-contrast
Comparison: Corresponding MRI of the brain performed at the same
time.

CLINICAL DATA: Initial evaluation for chronic headache, new
features or increased frequency, recently postpartum.

EXAM:
MRA HEAD WITHOUT CONTRAST
TECHNIQUE: Angiographic images of the Circle of Willis were acquired using MRA
technique without intravenous contrast.

[Series 5: 3d cow · axial · 0.5mm · 0.41mm/px · z∈[-75,+6]mm · 23 of 172 slices shown]
[im 1/172]
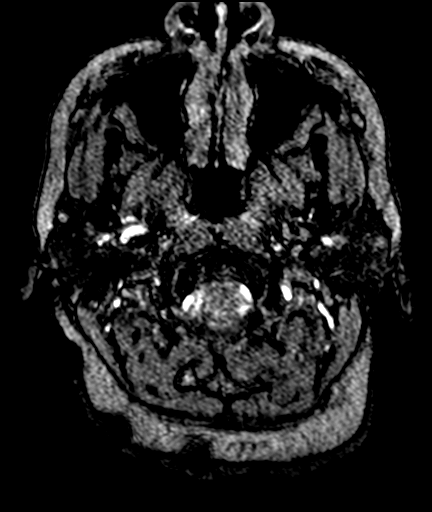
[im 4/172]
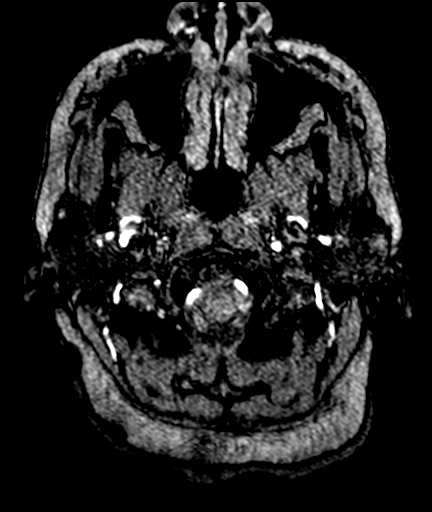
[im 8/172]
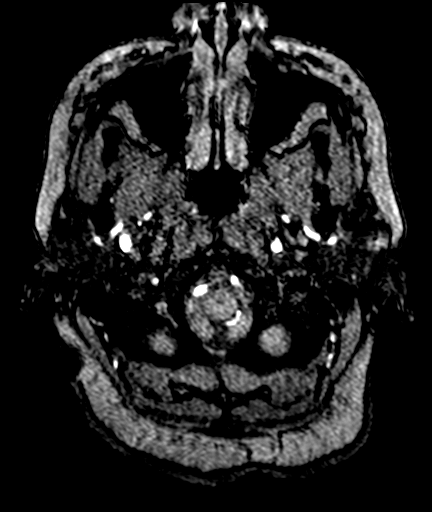
[im 11/172]
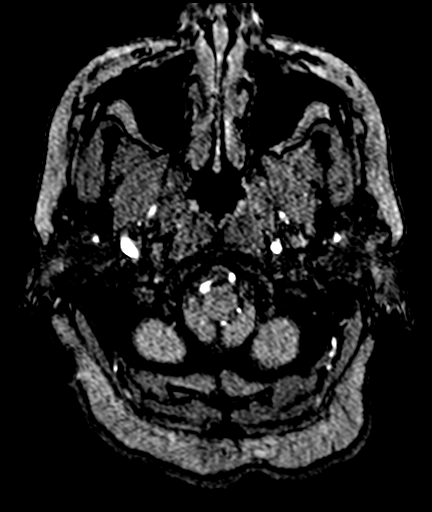
[im 15/172]
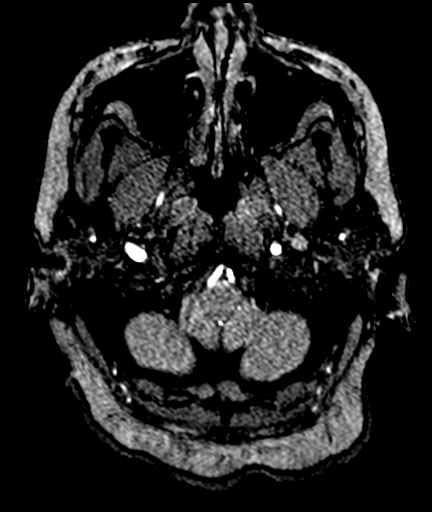
[im 19/172]
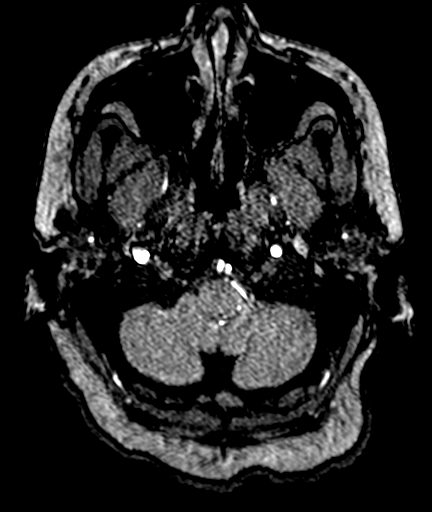
[im 22/172]
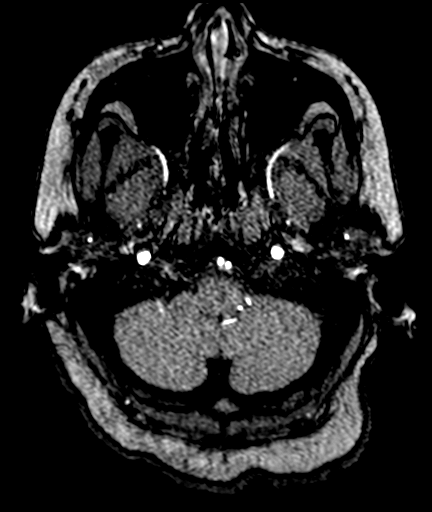
[im 26/172]
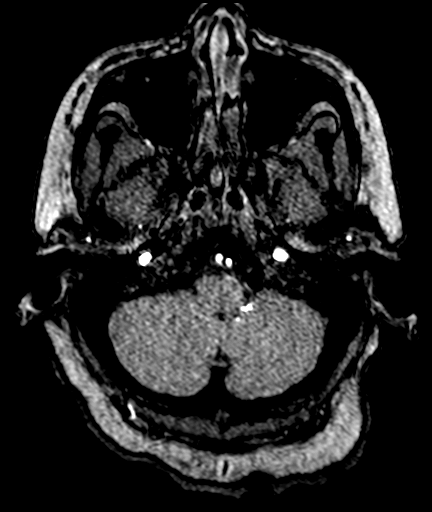
[im 30/172]
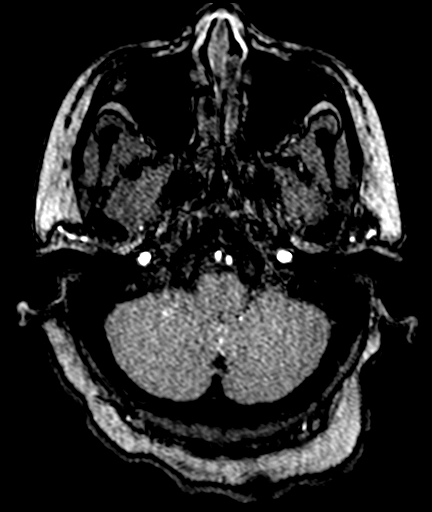
[im 33/172]
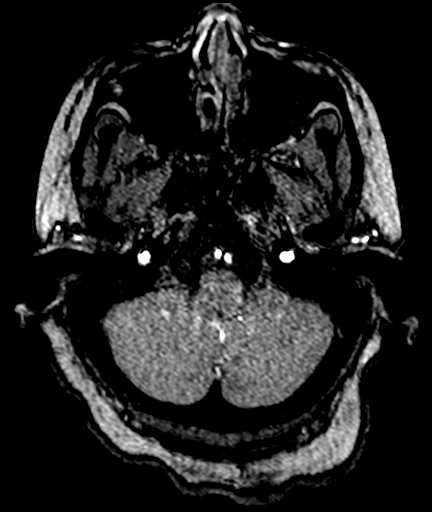
[im 37/172]
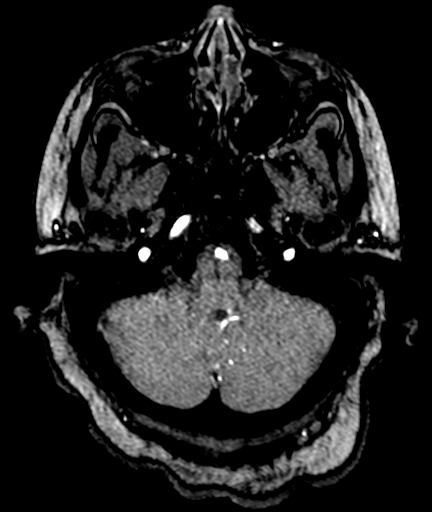
[im 41/172]
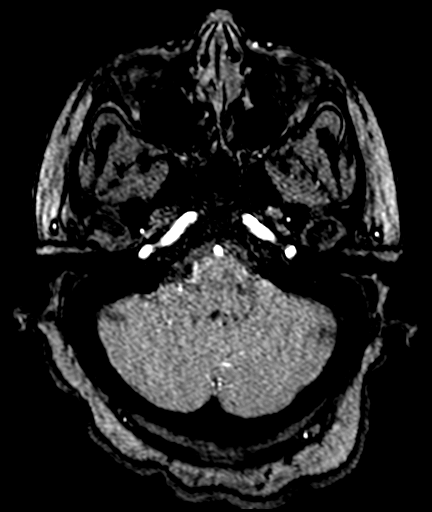
[im 44/172]
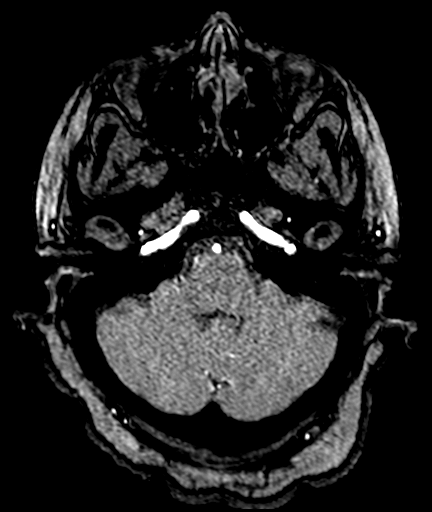
[im 48/172]
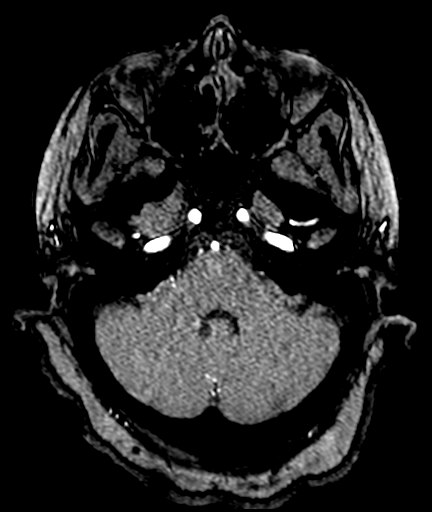
[im 51/172]
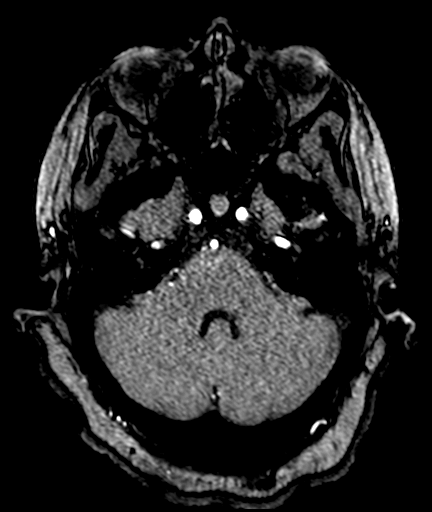
[im 55/172]
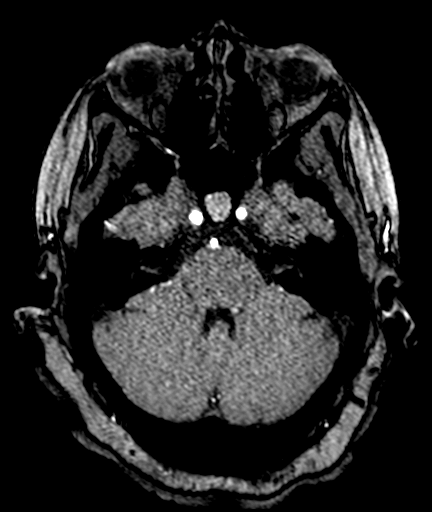
[im 77/172]
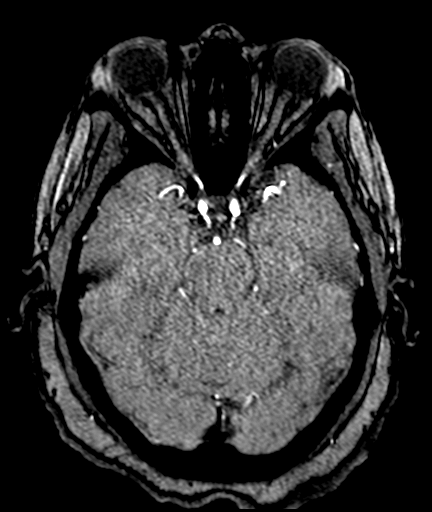
[im 88/172]
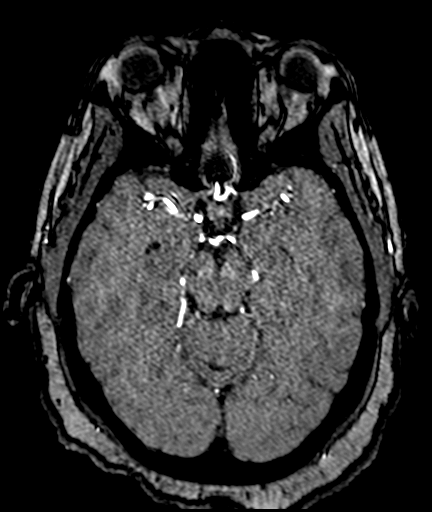
[im 99/172]
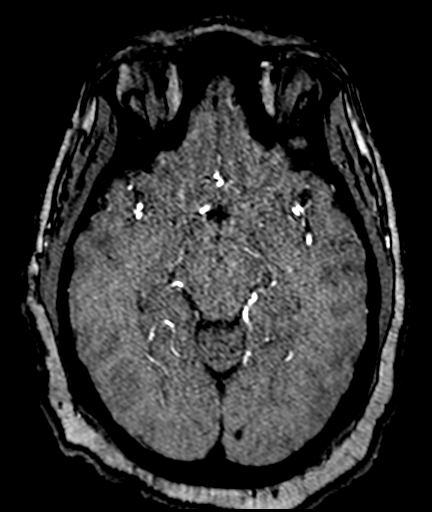
[im 121/172]
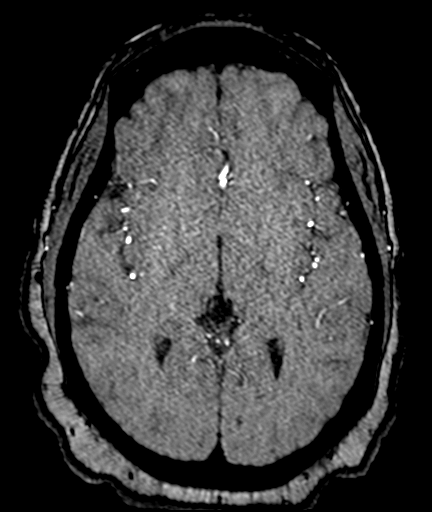
[im 142/172]
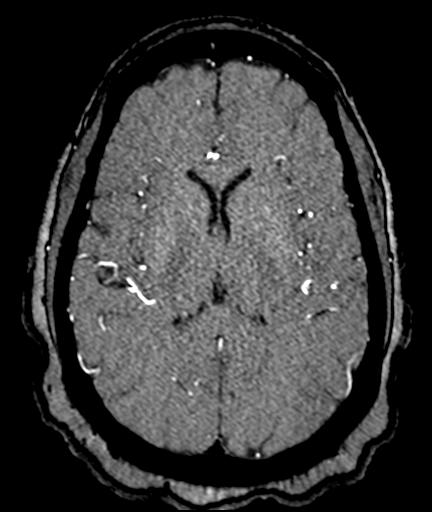
[im 146/172]
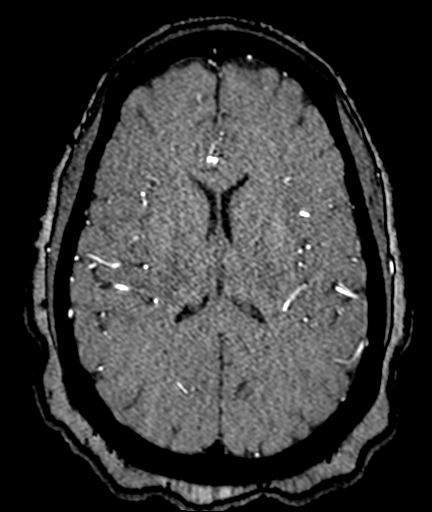
[im 164/172]
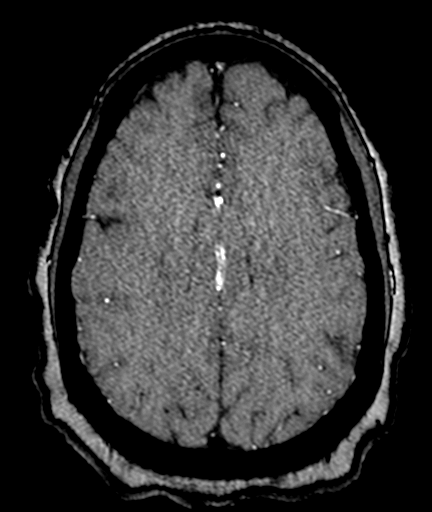

[23 of 48 positions shown; findings below may reference images not displayed]

FINDINGS: Anterior circulation: Both internal carotid arteries widely patent
to the termini without stenosis. A1 segments widely patent. Normal
anterior communicating artery complex. Both anterior cerebral
arteries widely patent to their distal aspects without stenosis. No
M1 stenosis or occlusion. Normal MCA bifurcations. Distal MCA
branches well perfused and symmetric.

Posterior circulation: Vertebral arteries are largely codominant and
widely patent to the vertebrobasilar junction. Left PICA origin
patent and normal. Right PICA not seen. Basilar widely patent to its
distal aspect without stenosis. Superior cerebellar arteries patent
bilaterally. Both PCA supplied via the basilar as well as small
bilateral posterior component arteries. PCAs well perfused to their
distal aspects without significant stenosis.

Anatomic variants: None significant.

Other: No intracranial aneurysm or other vascular abnormality.
IMPRESSION: Normal intracranial MRA. No aneurysm or other vascular abnormality.

## 2021-04-15 IMAGING — MR MR MRV HEAD WO/W CM
5 of 6 series · 41 of 48 positions shown · IV contrast (Gadavist)
Comparison: Comparison made with corresponding brain MRI performed
at the same time.

CLINICAL DATA: Initial evaluation for severe headaches for 2 weeks,
recently postpartum.

EXAM:
MR VENOGRAM HEAD WITHOUT AND WITH CONTRAST
TECHNIQUE: Angiographic images of the intracranial venous structures were
acquired using MRV technique without and with intravenous contrast.
CONTRAST:  10mL GADAVIST GADOBUTROL 1 MMOL/ML IV SOLN

[Series 9: tof_fl2d_paracor · coronal · 2.0mm · 0.98mm/px · 7 of 142 slices shown]
[im 1/142]
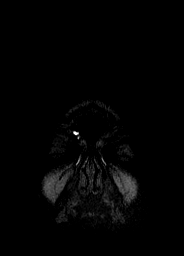
[im 24/142]
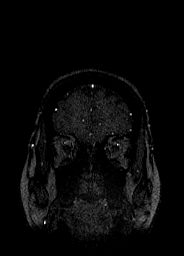
[im 48/142]
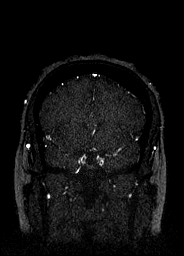
[im 71/142]
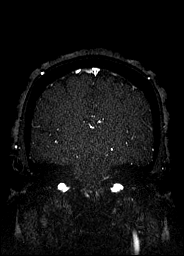
[im 95/142]
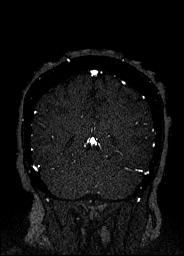
[im 118/142]
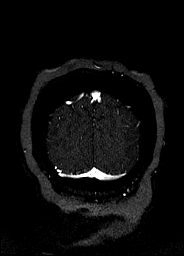
[im 142/142]
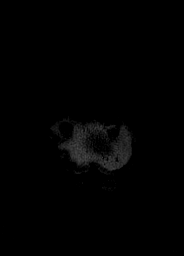

[Series 13: venous inhance coronal · coronal · portal-venous · 0.9mm · 0.57mm/px · 11 of 224 slices shown]
[im 1/224]
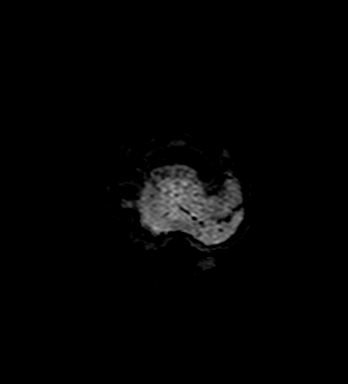
[im 23/224]
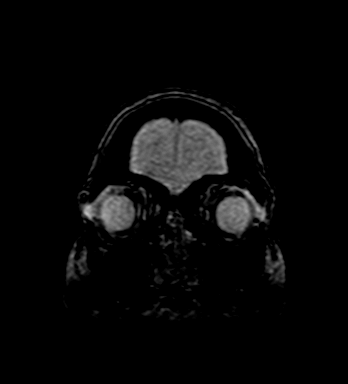
[im 45/224]
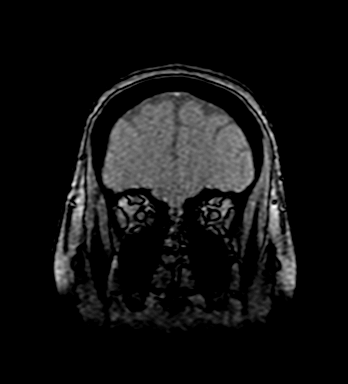
[im 67/224]
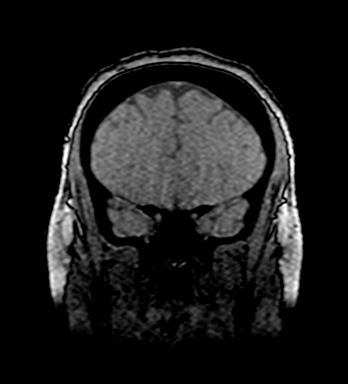
[im 90/224]
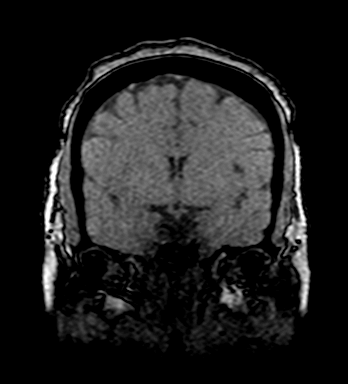
[im 112/224]
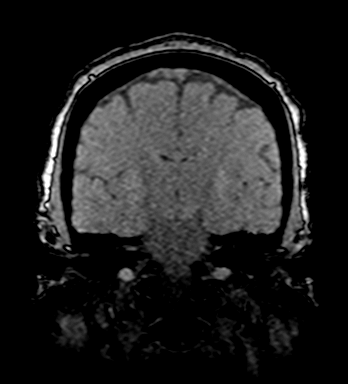
[im 134/224]
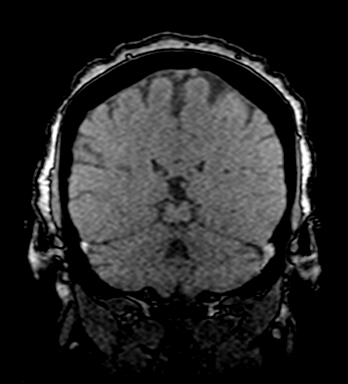
[im 157/224]
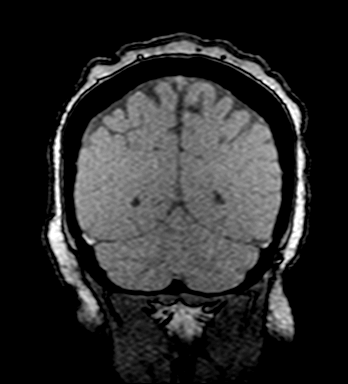
[im 179/224]
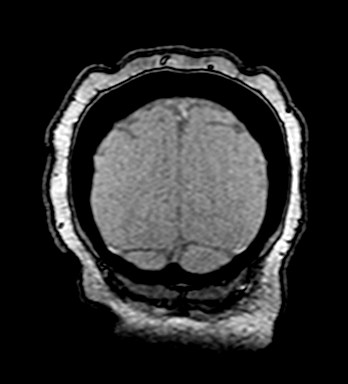
[im 201/224]
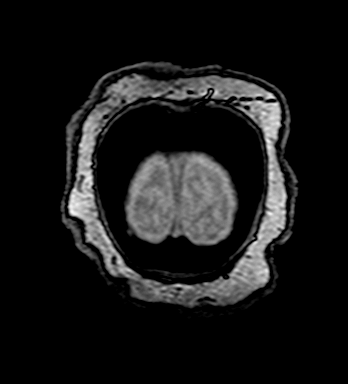
[im 224/224]
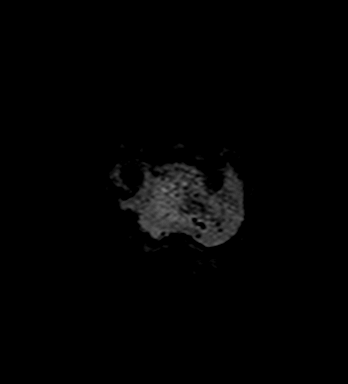

[Series 14: venous inhance coronal_msum · coronal · portal-venous · 0.9mm · 0.57mm/px · 11 of 218 slices shown]
[im 1/218]
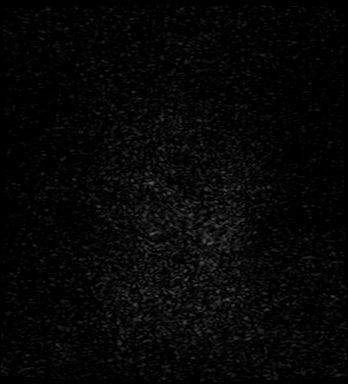
[im 22/218]
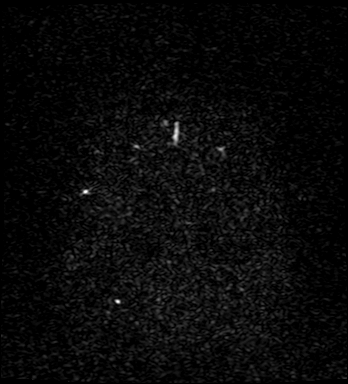
[im 44/218]
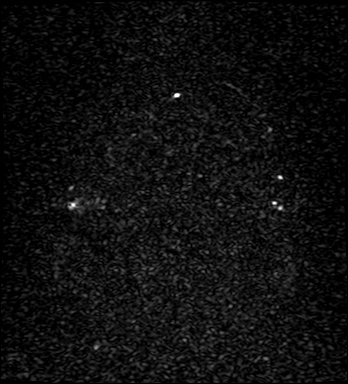
[im 66/218]
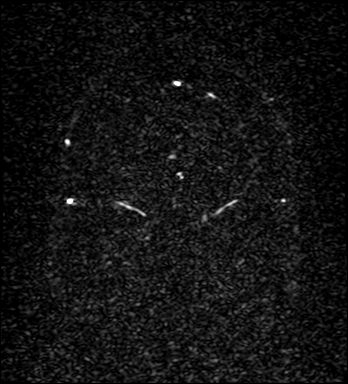
[im 87/218]
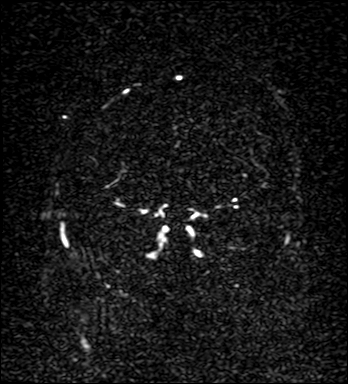
[im 109/218]
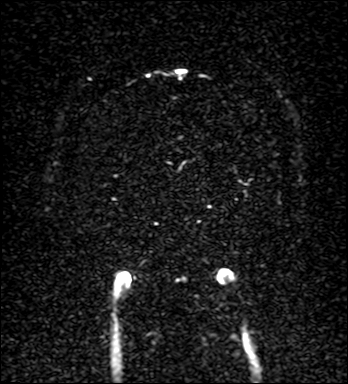
[im 131/218]
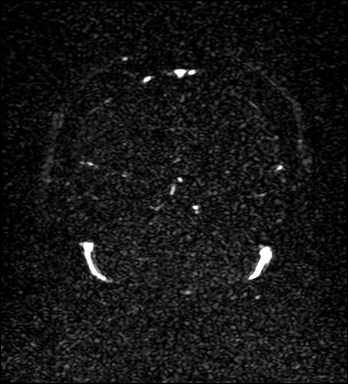
[im 152/218]
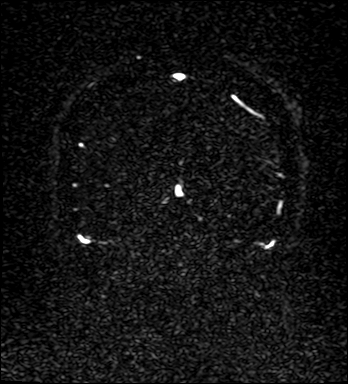
[im 174/218]
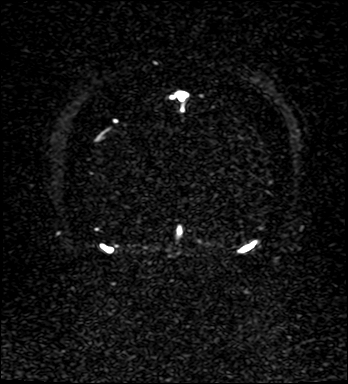
[im 196/218]
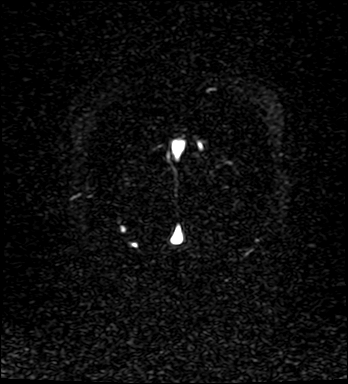
[im 218/218]
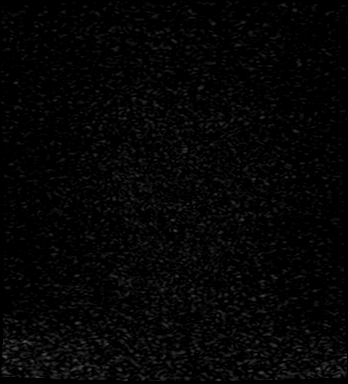

[Series 17: t1_fl3d_sag_p2_iso · sagittal · 1.0mm · 0.98mm/px · 9 of 176 slices shown]
[im 1/176]
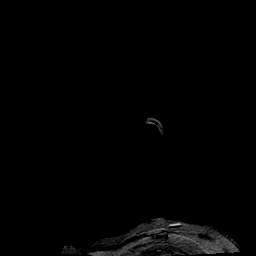
[im 22/176]
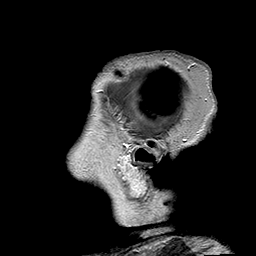
[im 44/176]
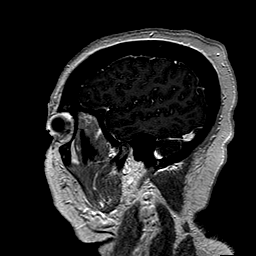
[im 66/176]
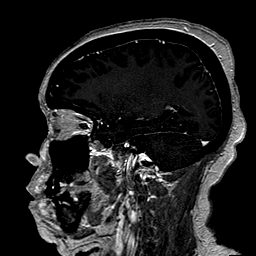
[im 88/176]
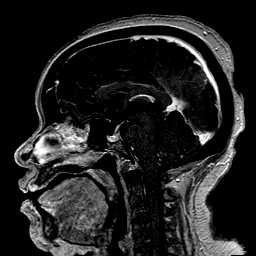
[im 110/176]
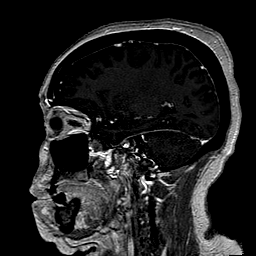
[im 132/176]
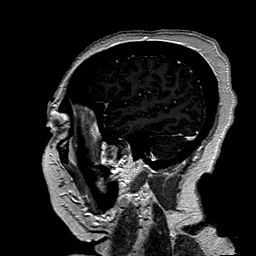
[im 154/176]
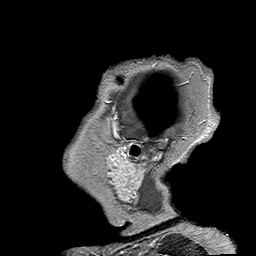
[im 176/176]
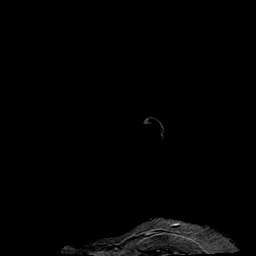

[Series 18: t1_fl3d_sag_p2_iso_mpr_ axial · axial · 2.0mm · 0.45mm/px · z∈[-111,-13]mm · 3 of 100 slices shown]
[im 1/100]
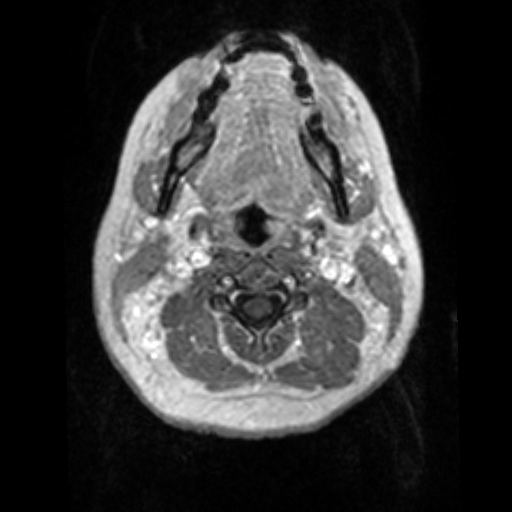
[im 25/100]
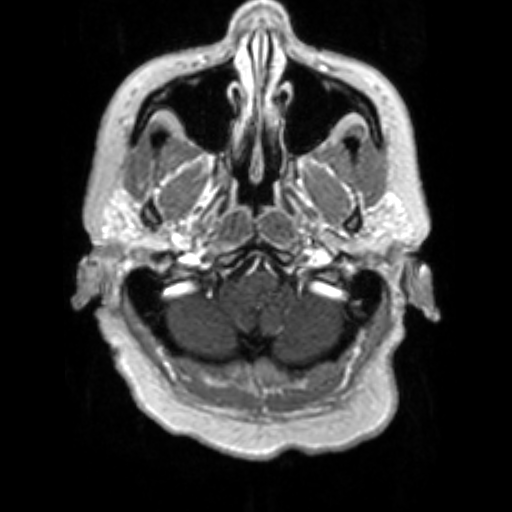
[im 50/100]
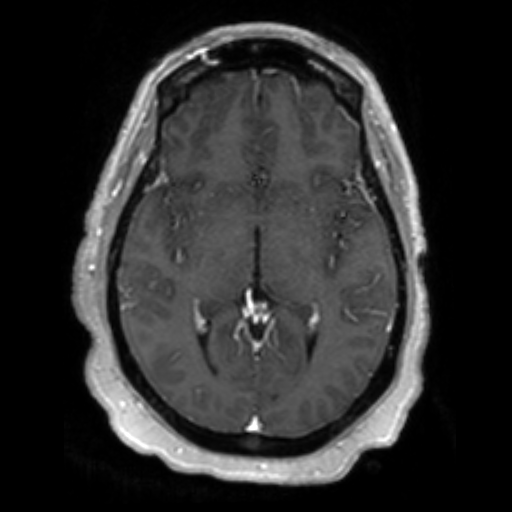

[41 of 48 positions shown; findings below may reference images not displayed]

FINDINGS: Normal flow related signal and enhancement seen throughout the
superior sagittal sinus to the torcula. Transverse and sigmoid
sinuses are patent as are the visualized proximal internal jugular
veins. Straight sinus, vein of CALIX, internal cerebral veins, and
basal veins of CALIX appear patent. No abnormality about the
cavernous sinus. Superior orbital veins appear symmetric and within
normal limits. No evidence for dural sinus thrombosis.

Probable mild to moderate stenosis seen at the junction of the left
transverse and sigmoid sinus, of uncertain significance in the
absence of other morphologic features of idiopathic intracranial
hypertension. No more than mild narrowing at the junction of the
right transverse and sigmoid sinus.

Incidental note made of a small DVA at the parasagittal anterior
right frontal lobe.
IMPRESSION: Negative intracranial MRV.  No evidence for dural sinus thrombosis.

## 2021-04-15 IMAGING — MR MR HEAD WO/W CM
14 of 16 series · 40 of 48 positions shown · IV contrast (gadavist)
Comparison: Comparison made with prior head CT from [DATE].

CLINICAL DATA: Initial evaluation for chronic headache, with new
features or increased frequency. Recently postpartum.

EXAM:
MRI HEAD WITHOUT AND WITH CONTRAST
TECHNIQUE: Multiplanar, multiecho pulse sequences of the brain and surrounding
structures were obtained without and with intravenous contrast.
CONTRAST:  10mL GADAVIST GADOBUTROL 1 MMOL/ML IV SOLN

[Series 5: DWI · axial · 3.0mm · 0.88mm/px · z∈[-81,+60]mm · 5 of 96 slices shown (1 of 4)]
[im 1/96]
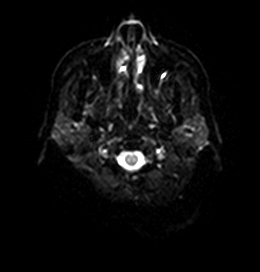
[im 24/96]
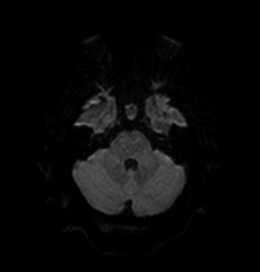
[im 48/96]
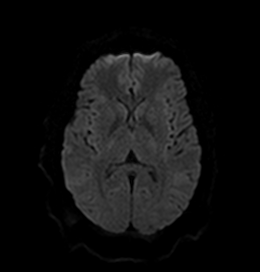
[im 72/96]
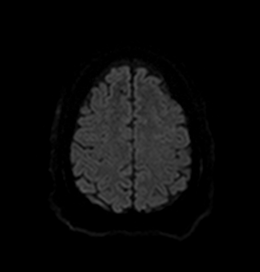
[im 96/96]
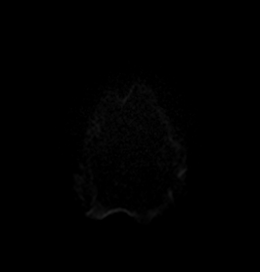

[Series 6: DWI · axial · 3.0mm · 0.88mm/px · z∈[-81,+60]mm · 2 of 48 slices shown (2 of 4)]
[im 1/48]
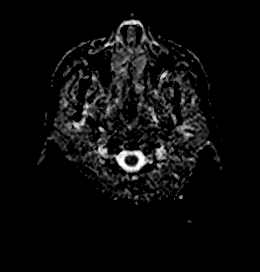
[im 48/48]
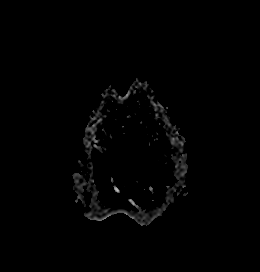

[Series 7: DWI · coronal · 4.0mm · 0.88mm/px · 4 of 71 slices shown (3 of 4)]
[im 1/71]
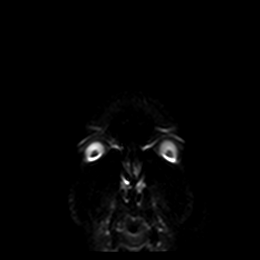
[im 24/71]
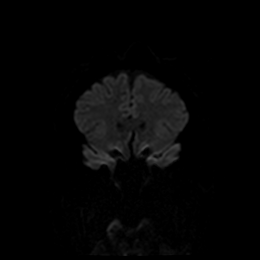
[im 47/71]
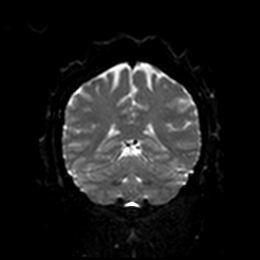
[im 71/71]
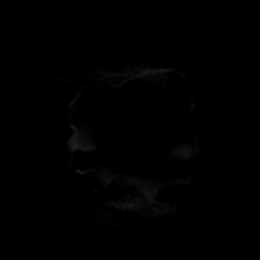

[Series 8: DWI · coronal · 4.0mm · 0.88mm/px · 2 of 36 slices shown (4 of 4)]
[im 1/36]
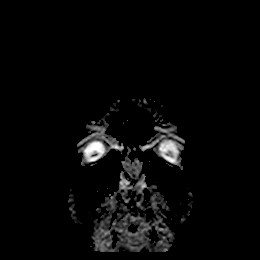
[im 36/36]
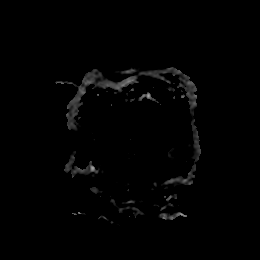

[Series 9: T1 · sagittal · 5.0mm · 0.75mm/px · 2 of 25 slices shown]
[im 1/25]
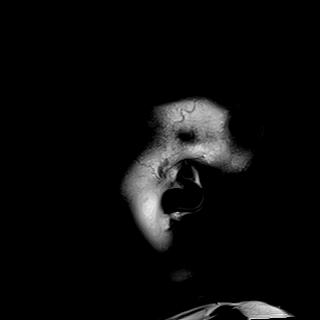
[im 25/25]
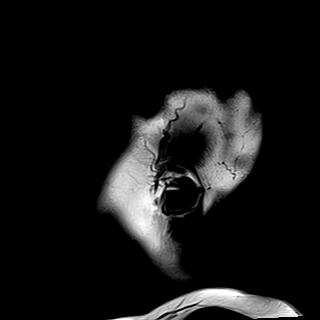

[Series 10: T2 · axial · 5.0mm · 0.72mm/px · z∈[-76,+68]mm · 2 of 25 slices shown]
[im 1/25]
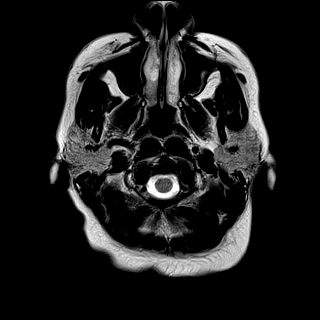
[im 25/25]
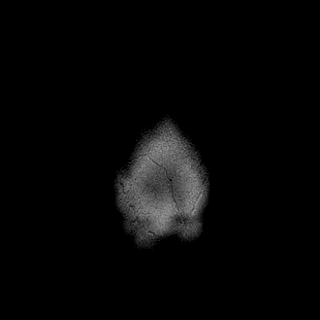

[Series 11: FLAIR · axial · 5.0mm · 0.45mm/px · z∈[-76,+67]mm · 2 of 25 slices shown]
[im 1/25]
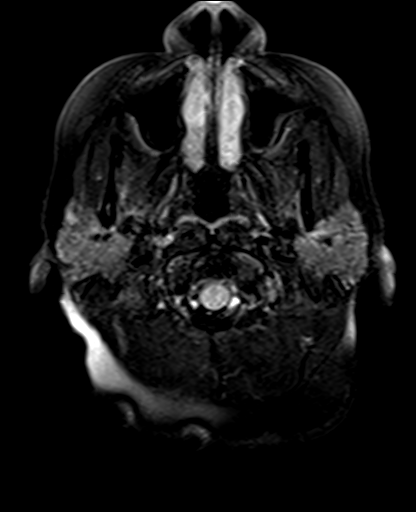
[im 25/25]
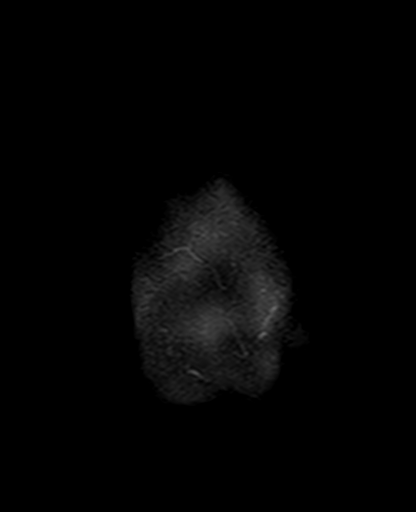

[Series 12: mag_images · axial · 3.0mm · 0.90mm/px · z∈[-87,+77]mm · 4 of 56 slices shown]
[im 1/56]
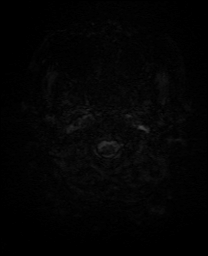
[im 19/56]
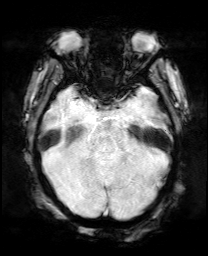
[im 37/56]
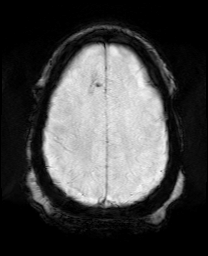
[im 56/56]
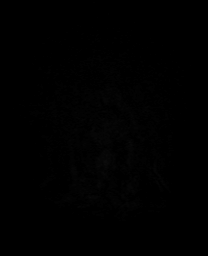

[Series 13: pha_images · axial · 3.0mm · 0.90mm/px · z∈[-87,+77]mm · 4 of 56 slices shown]
[im 1/56]
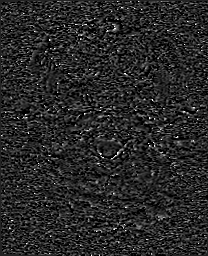
[im 19/56]
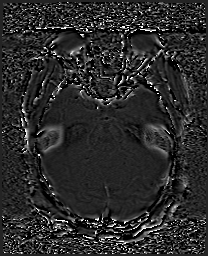
[im 37/56]
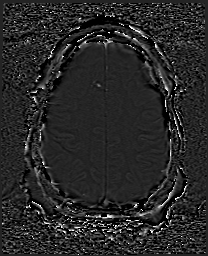
[im 56/56]
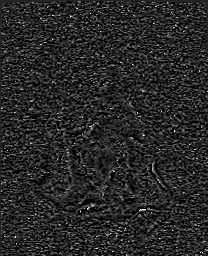

[Series 14: swi_images · axial · 3.0mm · 0.90mm/px · z∈[-87,+77]mm · 4 of 56 slices shown]
[im 1/56]
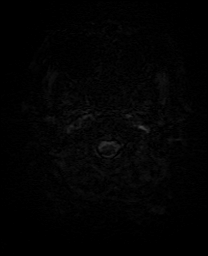
[im 19/56]
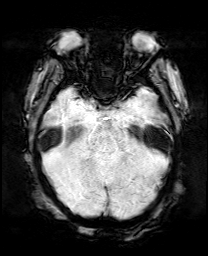
[im 37/56]
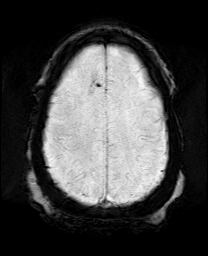
[im 56/56]
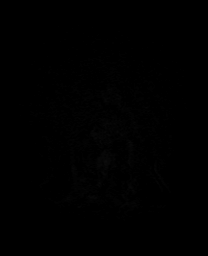

[Series 15: mip_images(sw) · axial · 24.0mm · 0.90mm/px · z∈[-76,+67]mm · 3 of 49 slices shown]
[im 1/49]
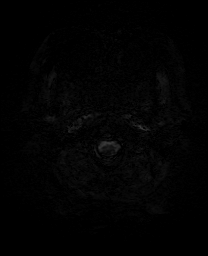
[im 25/49]
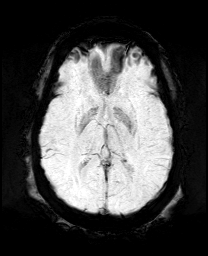
[im 49/49]
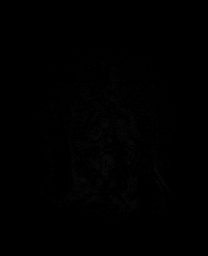

[Series 17: T2 post-contrast · coronal · 5.0mm · 0.72mm/px · 2 of 30 slices shown]
[im 1/30]
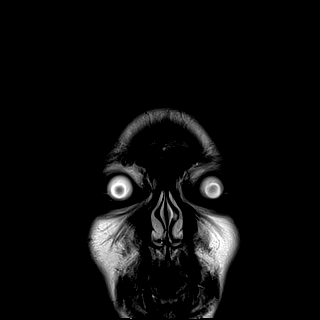
[im 30/30]
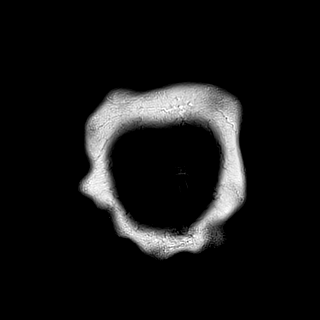

[Series 19: T1 post-contrast · coronal · 5.0mm · 0.34mm/px · 2 of 30 slices shown (1 of 2)]
[im 1/30]
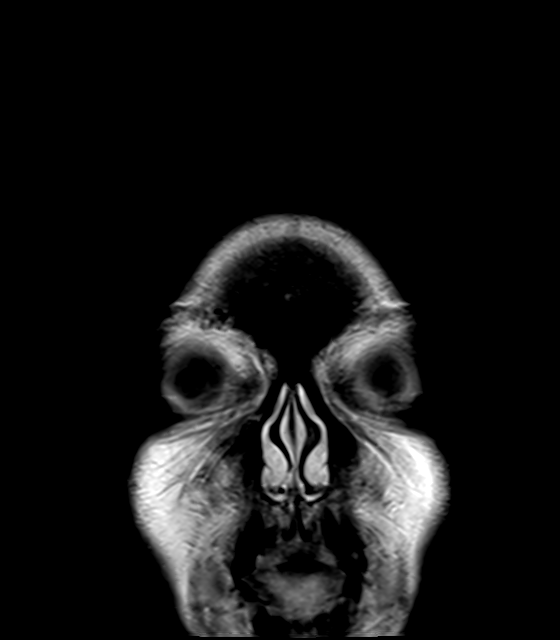
[im 30/30]
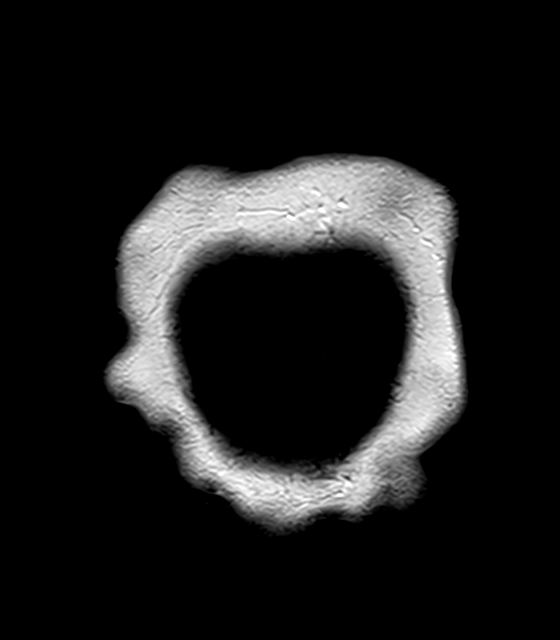

[Series 20: T1 post-contrast · sagittal · 5.0mm · 0.75mm/px · 2 of 25 slices shown (2 of 2)]
[im 1/25]
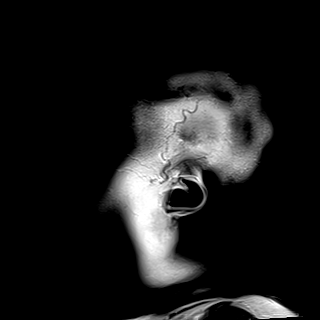
[im 25/25]
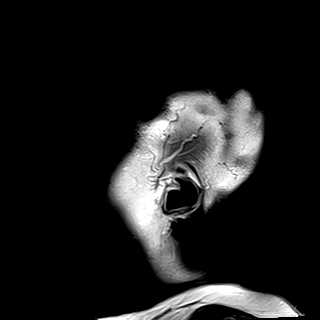

[40 of 48 positions shown; findings below may reference images not displayed]

FINDINGS: Brain: Cerebral volume within normal limits. No significant cerebral
white matter disease. No abnormal foci of restricted diffusion to
suggest acute or subacute ischemia. Gray-white matter
differentiation maintained. No encephalomalacia to suggest prior
infarct or other insult. No evidence for acute or chronic
intracranial hemorrhage.

No mass lesion, midline shift or mass effect. No hydrocephalus or
extra-axial fluid collection. Pituitary gland suprasellar region
normal. Midline structures intact and normal.

No abnormal enhancement. Incidental note made of a small DVA at the
parasagittal anterior right frontal lobe.

Vascular: Major intracranial vascular flow voids are well
maintained.

Skull and upper cervical spine: Craniocervical junction within
normal limits. Visualized upper cervical spine unremarkable.
Diffusely decreased T1 signal intensity seen throughout the
visualized bone marrow, likely related to patient's history of
anemia. No focal marrow replacing lesion. No scalp soft tissue
abnormality.

Sinuses/Orbits: Globes and orbital soft tissues demonstrate no acute
finding. Mild scattered mucosal thickening noted within the
ethmoidal air cells. Paranasal sinuses are otherwise clear. No
mastoid effusion. Inner ear structures grossly normal.

Other: Few mildly prominent level II lymph nodes noted within the
partially visualized upper neck, measuring up to 1.3 cm on the right
(series 17, image 18), of uncertain significance.
IMPRESSION: 1. Normal brain MRI. No findings to explain patient's symptoms
identified.
2. Few mildly prominent cervical lymph nodes within the partially
visualized upper neck, of uncertain significance, and may be
reactive in nature. Correlation with physical exam recommended.
Finding could be further assessed with dedicated CT of the neck for
further evaluation as clinically warranted.

## 2021-04-15 MED ORDER — LABETALOL HCL 100 MG PO TABS
400.0000 mg | ORAL_TABLET | Freq: Once | ORAL | Status: AC
  Administered 2021-04-15: 400 mg via ORAL
  Filled 2021-04-15: qty 4

## 2021-04-15 MED ORDER — GADOBUTROL 1 MMOL/ML IV SOLN
10.0000 mL | Freq: Once | INTRAVENOUS | Status: AC | PRN
  Administered 2021-04-15: 10 mL via INTRAVENOUS

## 2021-04-15 NOTE — MAU Note (Signed)
MRI stated it would be roughly an hour before patient able to come for scans. MRI requesting IV access anywhere and any gauge.

## 2021-04-15 NOTE — MAU Note (Signed)
BP still up, has been in a couple  times since delivery on the 4th. +HA(hasn't taken anything for the HA- nothing really helps her head, took BP meds). Denies visual changes, epigastric pain or swelling.

## 2021-04-15 NOTE — MAU Note (Signed)
Dr Cherly Hensen called, pt sent in PP- del 8/4, home nurse checked today BP elevated 170/100's w/HA.  Pt on Labetalol 300 BID

## 2021-04-15 NOTE — MAU Provider Note (Signed)
History     Chief Complaint  Patient presents with   Headache   Hypertension   40 yo G3P1021 MBF s/p SVD 8/4 after IOL 2nd to HTN sent in after family connect nurse called with elevated BP 170/122, 170/102. Pt has been on labetalol. She c/o persistent h/a, h/a is frontal and left top of head. Denies light sensitivity, neck pain, visual changes, positional changes. Took motrin and tylenol w/o change.  Last seen in  office for f/u [facility] visit and BP check with repeat PIH labs including PCR nl( 0.09)  OB History     Gravida  3   Para  1   Term  1   Preterm      AB  2   Living  1      SAB  2   IAB      Ectopic      Multiple  0   Live Births  1           Past Medical History:  Diagnosis Date   Obesity    PCOS (polycystic ovarian syndrome)    PID (pelvic inflammatory disease)    Ruptured ovarian cyst    Vaginal Pap smear, abnormal     Past Surgical History:  Procedure Laterality Date   DILATION AND CURETTAGE OF UTERUS     LAPAROSCOPY      Family History  Problem Relation Age of Onset   Asthma Brother    Cancer Maternal Grandmother    Cancer Maternal Grandfather    Diabetes Maternal Grandfather    Stroke Paternal Grandfather     Social History   Tobacco Use   Smoking status: Former   Smokeless tobacco: Never  Building services engineer Use: Never used  Substance Use Topics   Alcohol use: Not Currently    Comment: occ   Drug use: Never    Allergies: No Known Allergies  Medications Prior to Admission  Medication Sig Dispense Refill Last Dose   acetaminophen (TYLENOL) 500 MG tablet Take 500 mg by mouth every 6 (six) hours as needed.   04/14/2021   ferrous sulfate 325 (65 FE) MG tablet Take 1 tablet (325 mg total) by mouth 3 (three) times daily with meals. 90 tablet 3 04/15/2021   labetalol (NORMODYNE) 200 MG tablet Take 1 tablet (200 mg total) by mouth 2 (two) times daily. (Patient taking differently: Take 300 mg by mouth 2 (two) times daily.) 60 tablet  0 04/15/2021 at 1200   hydrochlorothiazide (MICROZIDE) 12.5 MG capsule Take 1 capsule (12.5 mg total) by mouth daily for 5 days. 5 capsule 0    ibuprofen (ADVIL) 600 MG tablet Take 1 tablet (600 mg total) by mouth every 6 (six) hours as needed. 30 tablet 11    Prenatal Vit-Fe Fumarate-FA (PRENATAL MULTIVITAMIN) TABS tablet Take 1 tablet by mouth daily at 12 noon.        Physical Exam   Blood pressure (!) 156/110, pulse 81, temperature 99.7 F (37.6 C), temperature source Oral, resp. rate 16, SpO2 98 %, currently breastfeeding. BP (!) 156/110   Pulse 81   Temp 99.7 F (37.6 C) (Oral)   Resp 16   SpO2 98%   Breastfeeding Yes Comment: baby is doing well Patient Vitals for the past 24 hrs:  BP Temp Temp src Pulse Resp SpO2  04/15/21 1831 (!) 156/110 -- -- 81 -- --  04/15/21 1816 139/90 -- -- 81 -- --  04/15/21 1801 133/89 -- -- 79 -- --  04/15/21 1747 132/90 -- -- 83 -- --  04/15/21 1731 (!) 139/93 -- -- 86 -- --  04/15/21 1719 (!) 157/105 -- -- 86 -- --  04/15/21 1701 (!) 146/102 99.7 F (37.6 C) Oral 96 16 98 %     General appearance: alert, cooperative, and no distress Lungs: clear to auscultation bilaterally Heart: regular rate and rhythm, S1, S2 normal, no murmur, click, rub or gallop Extremities: Homans sign is negative, no sign of DVT and no edema, redness or tenderness in the calves or thighs Neurologic: Alert and oriented X 3, normal strength and tone. Normal symmetric reflexes. Normal coordination and gait Reflexes: 2+ and symmetric  CBC    Component Value Date/Time   WBC 4.8 04/15/2021 1802   RBC 3.65 (L) 04/15/2021 1802   HGB 10.3 (L) 04/15/2021 1802   HCT 31.6 (L) 04/15/2021 1802   PLT 265 04/15/2021 1802   MCV 86.6 04/15/2021 1802   MCH 28.2 04/15/2021 1802   MCHC 32.6 04/15/2021 1802   RDW 14.6 04/15/2021 1802   LYMPHSABS 2.5 04/01/2021 1753   MONOABS 0.6 04/01/2021 1753   EOSABS 0.2 04/01/2021 1753   BASOSABS 0.0 04/01/2021 1753   CMP Latest Ref Rng &  Units 04/15/2021 04/01/2021 03/27/2021  Glucose 70 - 99 mg/dL 90 87 299(M)  BUN 6 - 20 mg/dL 12 11 11   Creatinine 0.44 - 1.00 mg/dL 4.26 8.34)  Sodium 135 - 145 mmol/L 139 140 133(L)  Potassium 3.5 - 5.1 mmol/L 4.0 3.9 4.1  Chloride 98 - 111 mmol/L 106 107 105  CO2 22 - 32 mmol/L 28 26 20(L)  Calcium 8.9 - 10.3 mg/dL 1.96(Q) 2.2(L) 7.9(G)  Total Protein 6.5 - 8.1 g/dL 6.5 9.2(J) 4.8(L)  Total Bilirubin 0.3 - 1.2 mg/dL 0.3 1.9(E) 0.4  Alkaline Phos 38 - 126 U/L 68 99 165(H)  AST 15 - 41 U/L 19 49(H) 25  ALT 0 - 44 U/L 15 34 11   PCR 0.05  IMP: chronic HTn with exacerbation postpartum Persistent h/a. Concern for PRES, Cortical vein thrombosis P) neurology consult( done). MRI of brain, MRA and MRV of head done. Cont labetalol MDM   1.7(E, MD 6:52 PM 04/15/2021

## 2021-04-15 NOTE — Plan of Care (Signed)
Patient discussed briefly with me by Dr. Cherly Hensen as a curbside  History as provided to me:  40 year old with persistent severe headaches 2 weeks postpartum, has had some uncontrolled hypertension but this resolved prior to presentation with taking her home labetalol.  No meningitic signs.  Headache is not positional.  There are no visual changes.  No clear triggers.  Patient does not have any history of immunosuppression.  Laboratory data are reassuring with CMP essentially unremarkable (8/9 on further review, 8/23 CMP still pending) as well as CBC notable just for some anemia, improving from 8/9 from 8.6 to 10.3.  We discussed that that the appropriate imaging to rule out dangerous etiologies of headache would be MRI brain with and without contrast, MRV to rule out cerebral venous thrombosis given patient is at risk due to her recent pregnancy, and MRA to rule out aneurysm given that patient is at risk due to her history of hypertension.  If the studies are negative the patient will be appropriate for outpatient follow-up.  If they are positive neurology can be contacted for further recommendations  Brooke Dare MD-PhD [facility] [phone]  Available 7 AM to 7 PM, outside these hours please contact Neurologist on call listed on AMION

## 2021-04-16 MED ORDER — HYDROCODONE-ACETAMINOPHEN 5-325 MG PO TABS
1.0000 | ORAL_TABLET | Freq: Once | ORAL | Status: AC
  Administered 2021-04-16: 1 via ORAL
  Filled 2021-04-16: qty 1

## 2021-04-16 MED ORDER — LABETALOL HCL 200 MG PO TABS: 400.0000 mg | ORAL_TABLET | Freq: Two times a day (BID) | ORAL | 0 refills | Status: DC

## 2021-04-16 NOTE — Progress Notes (Signed)
Per pt, headache still present  but seems to lighten up with lowered BP. Will give vicodin tablet for headache  Reviewed MR ANGIO HEAD WO CONTRAST  Result Date: 04/15/2021 CLINICAL DATA:  Initial evaluation for chronic headache, new features or increased frequency, recently postpartum. EXAM: MRA HEAD WITHOUT CONTRAST TECHNIQUE: Angiographic images of the Circle of Willis were acquired using MRA technique without intravenous contrast. COMPARISON:  Corresponding MRI of the brain performed at the same time. FINDINGS: Anterior circulation: Both internal carotid arteries widely patent to the termini without stenosis. A1 segments widely patent. Normal anterior communicating artery complex. Both anterior cerebral arteries widely patent to their distal aspects without stenosis. No M1 stenosis or occlusion. Normal MCA bifurcations. Distal MCA branches well perfused and symmetric. Posterior circulation: Vertebral arteries are largely codominant and widely patent to the vertebrobasilar junction. Left PICA origin patent and normal. Right PICA not seen. Basilar widely patent to its distal aspect without stenosis. Superior cerebellar arteries patent bilaterally. Both PCA supplied via the basilar as well as small bilateral posterior component arteries. PCAs well perfused to their distal aspects without significant stenosis. Anatomic variants: None significant. Other: No intracranial aneurysm or other vascular abnormality. IMPRESSION: Normal intracranial MRA. No aneurysm or other vascular abnormality. Electronically Signed   By: Rise Mu M.D.   On: 04/15/2021 22:38   MR BRAIN W WO CONTRAST  Result Date: 04/15/2021 CLINICAL DATA:  Initial evaluation for chronic headache, with new features or increased frequency. Recently postpartum. EXAM: MRI HEAD WITHOUT AND WITH CONTRAST TECHNIQUE: Multiplanar, multiecho pulse sequences of the brain and surrounding structures were obtained without and with intravenous contrast.  CONTRAST:  94mL GADAVIST GADOBUTROL 1 MMOL/ML IV SOLN COMPARISON:  Comparison made with prior head CT from 11/27/2019. FINDINGS: Brain: Cerebral volume within normal limits. No significant cerebral white matter disease. No abnormal foci of restricted diffusion to suggest acute or subacute ischemia. Gray-white matter differentiation maintained. No encephalomalacia to suggest prior infarct or other insult. No evidence for acute or chronic intracranial hemorrhage. No mass lesion, midline shift or mass effect. No hydrocephalus or extra-axial fluid collection. Pituitary gland suprasellar region normal. Midline structures intact and normal. No abnormal enhancement. Incidental note made of a small DVA at the parasagittal anterior right frontal lobe. Vascular: Major intracranial vascular flow voids are well maintained. Skull and upper cervical spine: Craniocervical junction within normal limits. Visualized upper cervical spine unremarkable. Diffusely decreased T1 signal intensity seen throughout the visualized bone marrow, likely related to patient's history of anemia. No focal marrow replacing lesion. No scalp soft tissue abnormality. Sinuses/Orbits: Globes and orbital soft tissues demonstrate no acute finding. Mild scattered mucosal thickening noted within the ethmoidal air cells. Paranasal sinuses are otherwise clear. No mastoid effusion. Inner ear structures grossly normal. Other: Few mildly prominent level II lymph nodes noted within the partially visualized upper neck, measuring up to 1.3 cm on the right (series 17, image 18), of uncertain significance. IMPRESSION: 1. Normal brain MRI. No findings to explain patient's symptoms identified. 2. Few mildly prominent cervical lymph nodes within the partially visualized upper neck, of uncertain significance, and may be reactive in nature. Correlation with physical exam recommended. Finding could be further assessed with dedicated CT of the neck for further evaluation as  clinically warranted. Electronically Signed   By: Rise Mu M.D.   On: 04/15/2021 22:31   MR MRV HEAD W WO CONTRAST  Result Date: 04/15/2021 CLINICAL DATA:  Initial evaluation for severe headaches for 2 weeks, recently postpartum. EXAM: MR VENOGRAM  HEAD WITHOUT AND WITH CONTRAST TECHNIQUE: Angiographic images of the intracranial venous structures were acquired using MRV technique without and with intravenous contrast. CONTRAST:  74mL GADAVIST GADOBUTROL 1 MMOL/ML IV SOLN COMPARISON:  Comparison made with corresponding brain MRI performed at the same time. FINDINGS: Normal flow related signal and enhancement seen throughout the superior sagittal sinus to the torcula. Transverse and sigmoid sinuses are patent as are the visualized proximal internal jugular veins. Straight sinus, vein of Galen, internal cerebral veins, and basal veins of Rosenthal appear patent. No abnormality about the cavernous sinus. Superior orbital veins appear symmetric and within normal limits. No evidence for dural sinus thrombosis. Probable mild to moderate stenosis seen at the junction of the left transverse and sigmoid sinus, of uncertain significance in the absence of other morphologic features of idiopathic intracranial hypertension. No more than mild narrowing at the junction of the right transverse and sigmoid sinus. Incidental note made of a small DVA at the parasagittal anterior right frontal lobe. IMPRESSION: Negative intracranial MRV.  No evidence for dural sinus thrombosis. Electronically Signed   By: Rise Mu M.D.   On: 04/15/2021 22:51    MRI results with pt  Will d/c home  Refer to h/a center Increase labetalol to 400mg  po bid F/u HTN clinic

## 2021-05-08 DIAGNOSIS — O1003 Pre-existing essential hypertension complicating the puerperium: Secondary | ICD-10-CM | POA: Diagnosis not present

## 2021-05-08 DIAGNOSIS — O924 Hypogalactia: Secondary | ICD-10-CM | POA: Diagnosis not present

## 2021-10-07 DIAGNOSIS — Z01419 Encounter for gynecological examination (general) (routine) without abnormal findings: Secondary | ICD-10-CM | POA: Diagnosis not present

## 2021-10-07 DIAGNOSIS — I10 Essential (primary) hypertension: Secondary | ICD-10-CM | POA: Diagnosis not present

## 2021-10-07 DIAGNOSIS — R87610 Atypical squamous cells of undetermined significance on cytologic smear of cervix (ASC-US): Secondary | ICD-10-CM | POA: Diagnosis not present

## 2021-11-16 DIAGNOSIS — J029 Acute pharyngitis, unspecified: Secondary | ICD-10-CM | POA: Diagnosis not present

## 2021-11-16 DIAGNOSIS — I10 Essential (primary) hypertension: Secondary | ICD-10-CM | POA: Diagnosis not present

## 2021-11-16 DIAGNOSIS — R0982 Postnasal drip: Secondary | ICD-10-CM | POA: Diagnosis not present

## 2021-11-17 DIAGNOSIS — J029 Acute pharyngitis, unspecified: Secondary | ICD-10-CM | POA: Diagnosis not present

## 2021-11-17 DIAGNOSIS — J069 Acute upper respiratory infection, unspecified: Secondary | ICD-10-CM | POA: Diagnosis not present

## 2021-12-18 DIAGNOSIS — I10 Essential (primary) hypertension: Secondary | ICD-10-CM | POA: Diagnosis not present

## 2021-12-18 DIAGNOSIS — Z6841 Body Mass Index (BMI) 40.0 and over, adult: Secondary | ICD-10-CM | POA: Diagnosis not present

## 2021-12-18 DIAGNOSIS — R635 Abnormal weight gain: Secondary | ICD-10-CM | POA: Diagnosis not present

## 2022-03-05 DIAGNOSIS — I10 Essential (primary) hypertension: Secondary | ICD-10-CM | POA: Diagnosis not present

## 2022-03-05 DIAGNOSIS — Z6841 Body Mass Index (BMI) 40.0 and over, adult: Secondary | ICD-10-CM | POA: Diagnosis not present

## 2022-03-05 DIAGNOSIS — E663 Overweight: Secondary | ICD-10-CM | POA: Diagnosis not present

## 2022-03-18 DIAGNOSIS — I10 Essential (primary) hypertension: Secondary | ICD-10-CM | POA: Diagnosis not present

## 2022-03-18 DIAGNOSIS — Z319 Encounter for procreative management, unspecified: Secondary | ICD-10-CM | POA: Diagnosis not present

## 2022-04-10 DIAGNOSIS — Z8742 Personal history of other diseases of the female genital tract: Secondary | ICD-10-CM | POA: Diagnosis not present

## 2022-04-10 DIAGNOSIS — Z1231 Encounter for screening mammogram for malignant neoplasm of breast: Secondary | ICD-10-CM | POA: Diagnosis not present

## 2022-05-01 DIAGNOSIS — E282 Polycystic ovarian syndrome: Secondary | ICD-10-CM | POA: Diagnosis not present

## 2022-05-22 ENCOUNTER — Ambulatory Visit: Payer: BC Managed Care – PPO | Admitting: Cardiology

## 2022-06-12 ENCOUNTER — Encounter: Payer: Self-pay | Admitting: Cardiology

## 2022-06-12 ENCOUNTER — Ambulatory Visit (INDEPENDENT_AMBULATORY_CARE_PROVIDER_SITE_OTHER): Payer: BC Managed Care – PPO | Admitting: Cardiology

## 2022-06-12 VITALS — BP 170/110 | HR 76 | Ht 63.0 in | Wt 272.6 lb

## 2022-06-12 DIAGNOSIS — R0683 Snoring: Secondary | ICD-10-CM

## 2022-06-12 DIAGNOSIS — I10 Essential (primary) hypertension: Secondary | ICD-10-CM | POA: Diagnosis not present

## 2022-06-12 MED ORDER — NIFEDIPINE ER OSMOTIC RELEASE 30 MG PO TB24: 30.0000 mg | ORAL_TABLET | Freq: Every day | ORAL | 2 refills | Status: DC

## 2022-06-12 MED ORDER — LABETALOL HCL 200 MG PO TABS: 400.0000 mg | ORAL_TABLET | Freq: Two times a day (BID) | ORAL | 1 refills | Status: DC

## 2022-06-12 NOTE — Progress Notes (Unsigned)
Cardio-Obstetrics Clinic  New Evaluation  Date:  06/16/2022   ID:  Erin, [name] 07-03-1981, MRN [id number]  PCP:  Lin Landsman, [place] [facility] Cardiologist:  Berniece Salines, DO  Electrophysiologist:  None       Referring MD: Servando Salina, MD   Chief Complaint: " I have high blood pressure"  History of Present Illness:    Erin [name] is a 41 y.o. female [G3P1021] who is being seen today for the evaluation of chronic hypertension at the request of Servando Salina, MD.   Medical history includes PCOS, morbid obesity, hypertension.  She has been diagnosed with hypertension several years back.  She had taking various medication.  Most recently she was started on labetalol given the fact that she had postpartum hypertension he had her child a year ago and now she is planning on having another baby.  She admits that she has not been quite elation with taking her medications.  And tells me that she had been looking at other nonmedicinal ways for helping with her blood pressure. No shortness of breath, no chest pain. She is here today with her husband.  Prior CV Studies Reviewed: The following studies were reviewed today:   Past Medical History:  Diagnosis Date   Obesity    PCOS (polycystic ovarian syndrome)    PID (pelvic inflammatory disease)    Ruptured ovarian cyst    Vaginal Pap smear, abnormal     Past Surgical History:  Procedure Laterality Date   DILATION AND CURETTAGE OF UTERUS     LAPAROSCOPY        OB History     Gravida  3   Para  1   Term  1   Preterm      AB  2   Living  1      SAB  2   IAB      Ectopic      Multiple  0   Live Births  1               Current Medications: Current Meds  Medication Sig   labetalol (NORMODYNE) 200 MG tablet Take 2 tablets (400 mg total) by mouth 2 (two) times daily.   NIFEdipine (PROCARDIA-XL/NIFEDICAL-XL) 30 MG 24 hr tablet Take 1 tablet (30 mg total) by mouth  daily.   [DISCONTINUED] ibuprofen (ADVIL) 600 MG tablet Take 1 tablet (600 mg total) by mouth every 6 (six) hours as needed.   [DISCONTINUED] labetalol (NORMODYNE) 200 MG tablet Take 2 tablets (400 mg total) by mouth 2 (two) times daily.   [DISCONTINUED] Prenatal Vit-Fe Fumarate-FA (PRENATAL MULTIVITAMIN) TABS tablet Take 1 tablet by mouth daily at 12 noon.     Allergies:   Patient has no known allergies.   Social History   Socioeconomic History   Marital status: Married    Spouse name: [name]   Number of children: Not on file   Years of education: Not on file   Highest education level: Not on file  Occupational History   Not on file  Tobacco Use   Smoking status: Former   Smokeless tobacco: Never  Vaping Use   Vaping Use: Never used  Substance and Sexual Activity   Alcohol use: Not Currently    Comment: occ   Drug use: Never   Sexual activity: Not Currently    Birth control/protection: None  Other Topics Concern   Not on file  Social History Narrative  Not on file   Social Determinants of Health   Financial Resource Strain: Not on file  Food Insecurity: Not on file  Transportation Needs: Not on file  Physical Activity: Not on file  Stress: Not on file  Social Connections: Not on file      Family History  Problem Relation Age of Onset   Asthma Brother    Cancer Maternal Grandmother    Cancer Maternal Grandfather    Diabetes Maternal Grandfather    Stroke Paternal Grandfather       ROS:   Please see the history of present illness.     All other systems reviewed and are negative.   Labs/EKG Reviewed:    EKG:   EKG is was ordered today.  The ekg ordered today demonstrates   Recent Labs: No results found for requested labs within last 365 days.   Recent Lipid Panel No results found for: "CHOL", "TRIG", "HDL", "CHOLHDL", "LDLCALC", "LDLDIRECT"  Physical Exam:    VS:  BP (!) 170/110 (BP Location: Right Arm, Patient Position: Sitting, Cuff  Size: Large)   Pulse 76   Ht 5\' 3"  (1.6 m)   Wt 272 lb 9.6 oz (123.7 kg)   SpO2 97%   BMI 48.29 kg/m     Wt Readings from Last 3 Encounters:  06/12/22 272 lb 9.6 oz (123.7 kg)  04/01/21 243 lb 1.6 oz (110.3 kg)  03/26/21 258 lb 4.8 oz (117.2 kg)     GEN:  Well nourished, well developed in no acute distress HEENT: Normal NECK: No JVD; No carotid bruits LYMPHATICS: No lymphadenopathy CARDIAC: RRR, no murmurs, rubs, gallops RESPIRATORY:  Clear to auscultation without rales, wheezing or rhonchi  ABDOMEN: Soft, non-tender, non-distended MUSCULOSKELETAL:  No edema; No deformity  SKIN: Warm and dry NEUROLOGIC:  Alert and oriented x 3 PSYCHIATRIC:  Normal affect    Risk Assessment/Risk Calculators:                 ASSESSMENT & PLAN:    Blood pressure elevated in the office today - this was manually performed by me.  She is labetalol 400 mg twice daily but has not been taking this. We discuss she will restart her medication. In addition I believe she will benefit from additional medication - so will add nifedipine 30mg  daily. She will take her bp daily and send me information via mychart.  Discussed complication that can occur due to untreated hypertension  Will get echo with hx of longstanding hypertension would like to assess for any structural abnormalities or impaired relaxation.   Concern for snoring - it will be beneficial to rule out sleep apnea in this patient.   She is planning on another IVF - last baby is about  a year old. Her antihypertensive regimen currently on is safe in pregnancy  Patient Instructions  Medication Instructions:  Your physician has recommended you make the following change in your medication: START: Nifedipine 30mg  daily START: Labetalol 400mg  Twice daily   *If you need a refill on your cardiac medications before your next appointment, please call your pharmacy*  Please take your blood pressure daily for 2 weeks and send in a MyChart  message. Please include heart rates.   HOW TO TAKE YOUR BLOOD PRESSURE: Rest 5 minutes before taking your blood pressure. Don't smoke or drink caffeinated beverages for at least 30 minutes before. Take your blood pressure before (not after) you eat. Sit comfortably with your back supported and both feet on the floor (  don't cross your legs). Elevate your arm to heart level on a table or a desk. Use the proper sized cuff. It should fit smoothly and snugly around your bare upper arm. There should be enough room to slip a fingertip under the cuff. The bottom edge of the cuff should be 1 inch above the crease of the elbow. Ideally, take 3 measurements at one sitting and record the average.    Lab Work: NONE If you have labs (blood work) drawn today and your tests are completely normal, you will receive your results only by: MyChart Message (if you have MyChart) OR A paper copy in the mail If you have any lab test that is abnormal or we need to change your treatment, we will call you to review the results.   Testing/Procedures: Your physician has requested that you have an echocardiogram. Echocardiography is a painless test that uses sound waves to create images of your heart. It provides your doctor with information about the size and shape of your heart and how well your heart's chambers and valves are working. This procedure takes approximately one hour. There are no restrictions for this procedure. Please do NOT wear cologne, perfume, aftershave, or lotions (deodorant is allowed). Please arrive 15 minutes prior to your appointment time.  Your physician has recommended that you have a sleep study. This test records several body functions during sleep, including: brain activity, eye movement, oxygen and carbon dioxide blood levels, heart rate and rhythm, breathing rate and rhythm, the flow of air through your mouth and nose, snoring, body muscle movements, and chest and belly movement.      Follow-Up: At [facility], you and your health needs are our priority.  As part of our continuing mission to provide you with exceptional heart care, we have created designated Provider Care Teams.  These Care Teams include your primary Cardiologist (physician) and Advanced Practice Providers (APPs -  Physician Assistants and Nurse Practitioners) who all work together to provide you with the care you need, when you need it.  We recommend signing up for the patient portal called "MyChart".  Sign up information is provided on this After Visit Summary.  MyChart is used to connect with patients for Virtual Visits (Telemedicine).  Patients are able to view lab/test results, encounter notes, upcoming appointments, etc.  Non-urgent messages can be sent to your provider as well.   To learn more about what you can do with MyChart, go to [email or web address].    Your next appointment:   8 week(s)  The format for your next appointment:   In Person  Provider:   Thomasene Ripple, DO  [address] [place], [address]    Dispo:  Return in about 8 weeks (around 08/07/2022).   Medication Adjustments/Labs and Tests Ordered: Current medicines are reviewed at length with the patient today.  Concerns regarding medicines are outlined above.  Tests Ordered: Orders Placed This Encounter  Procedures   ECHOCARDIOGRAM COMPLETE   Split night study   Medication Changes: Meds ordered this encounter  Medications   NIFEdipine (PROCARDIA-XL/NIFEDICAL-XL) 30 MG 24 hr tablet    Sig: Take 1 tablet (30 mg total) by mouth daily.    Dispense:  30 tablet    Refill:  2   labetalol (NORMODYNE) 200 MG tablet    Sig: Take 2 tablets (400 mg total) by mouth 2 (two) times daily.    Dispense:  120 tablet    Refill:  1

## 2022-06-12 NOTE — Patient Instructions (Addendum)
Medication Instructions:  Your physician has recommended you make the following change in your medication: START: Nifedipine 30mg  daily START: Labetalol 400mg  Twice daily   *If you need a refill on your cardiac medications before your next appointment, please call your pharmacy*  Please take your blood pressure daily for 2 weeks and send in a MyChart message. Please include heart rates.   HOW TO TAKE YOUR BLOOD PRESSURE: Rest 5 minutes before taking your blood pressure. Don't smoke or drink caffeinated beverages for at least 30 minutes before. Take your blood pressure before (not after) you eat. Sit comfortably with your back supported and both feet on the floor (don't cross your legs). Elevate your arm to heart level on a table or a desk. Use the proper sized cuff. It should fit smoothly and snugly around your bare upper arm. There should be enough room to slip a fingertip under the cuff. The bottom edge of the cuff should be 1 inch above the crease of the elbow. Ideally, take 3 measurements at one sitting and record the average.    Lab Work: NONE If you have labs (blood work) drawn today and your tests are completely normal, you will receive your results only by: [place] (if you have MyChart) OR A paper copy in the mail If you have any lab test that is abnormal or we need to change your treatment, we will call you to review the results.   Testing/Procedures: Your physician has requested that you have an echocardiogram. Echocardiography is a painless test that uses sound waves to create images of your heart. It provides your doctor with information about the size and shape of your heart and how well your heart's chambers and valves are working. This procedure takes approximately one hour. There are no restrictions for this procedure. Please do NOT wear cologne, perfume, aftershave, or lotions (deodorant is allowed). Please arrive 15 minutes prior to your appointment time.  Your  physician has recommended that you have a sleep study. This test records several body functions during sleep, including: brain activity, eye movement, oxygen and carbon dioxide blood levels, heart rate and rhythm, breathing rate and rhythm, the flow of air through your mouth and nose, snoring, body muscle movements, and chest and belly movement.     Follow-Up: At [facility], you and your health needs are our priority.  As part of our continuing mission to provide you with exceptional heart care, we have created designated Provider Care Teams.  These Care Teams include your primary Cardiologist (physician) and Advanced Practice Providers (APPs -  Physician Assistants and Nurse Practitioners) who all work together to provide you with the care you need, when you need it.  We recommend signing up for the patient portal called "MyChart".  Sign up information is provided on this After Visit Summary.  MyChart is used to connect with patients for Virtual Visits (Telemedicine).  Patients are able to view lab/test results, encounter notes, upcoming appointments, etc.  Non-urgent messages can be sent to your provider as well.   To learn more about what you can do with MyChart, go to [email or web address].    Your next appointment:   8 week(s)  The format for your next appointment:   In Person  Provider:   Berniece Salines, DO  [address] [place], [address]

## 2022-06-18 ENCOUNTER — Telehealth: Payer: Self-pay | Admitting: *Deleted

## 2022-06-18 NOTE — Telephone Encounter (Signed)
Prior Authorization for split night sleep study sent to [facility] via Phone. Per CSR no PA is required. Call reference # [id number].

## 2022-06-30 ENCOUNTER — Ambulatory Visit (HOSPITAL_COMMUNITY): Payer: BC Managed Care – PPO | Attending: Cardiology

## 2022-06-30 DIAGNOSIS — I10 Essential (primary) hypertension: Secondary | ICD-10-CM | POA: Insufficient documentation

## 2022-06-30 LAB — ECHOCARDIOGRAM COMPLETE
Area-P 1/2: 3.17 cm2
P 1/2 time: 447 ms
S' Lateral: 3.1 cm

## 2022-07-02 ENCOUNTER — Telehealth: Payer: Self-pay | Admitting: Cardiology

## 2022-07-02 NOTE — Telephone Encounter (Signed)
Patient wanted to follow-up and provide further information on her symptoms.

## 2022-07-02 NOTE — Telephone Encounter (Signed)
Spoke with pt, she was asked yesterday at her appointment about SOB and chest pain and she said no because she felt the symptoms she was having were due to weight gain. She reports she does get SOB when climbing stairs and with walking. She reports she can walk to the mailbox with no SOB. She denies swelling. She also reports chest pain less frequently than the SOB. She reports a pressure in her chest that can last a few minutes. She will have this a couple times a week, 1 time a day. She wanted to make sure dr tobb knew this when she was reviewing her EKG. Explained to patient it is very important to tell the doctor about these things when ask. Will make dr tobb aware.

## 2022-07-03 ENCOUNTER — Encounter: Payer: Self-pay | Admitting: Cardiology

## 2022-07-09 ENCOUNTER — Other Ambulatory Visit: Payer: Self-pay

## 2022-07-09 MED ORDER — NIFEDIPINE ER 60 MG PO TB24: 60.0000 mg | ORAL_TABLET | Freq: Every day | ORAL | 1 refills | Status: DC

## 2022-07-27 ENCOUNTER — Ambulatory Visit: Payer: BC Managed Care – PPO | Admitting: Cardiology

## 2022-08-03 ENCOUNTER — Ambulatory Visit (HOSPITAL_BASED_OUTPATIENT_CLINIC_OR_DEPARTMENT_OTHER): Payer: BC Managed Care – PPO | Attending: Cardiology | Admitting: Cardiovascular Disease

## 2022-08-14 ENCOUNTER — Ambulatory Visit: Payer: BC Managed Care – PPO | Admitting: Cardiology

## 2022-09-02 ENCOUNTER — Ambulatory Visit: Payer: BC Managed Care – PPO | Admitting: Cardiology

## 2022-09-09 ENCOUNTER — Other Ambulatory Visit: Payer: Self-pay | Admitting: Cardiology

## 2022-10-19 ENCOUNTER — Ambulatory Visit: Payer: BC Managed Care – PPO | Attending: Cardiology | Admitting: Cardiology

## 2023-03-10 ENCOUNTER — Telehealth: Payer: BC Managed Care – PPO | Admitting: Physician Assistant

## 2023-03-10 DIAGNOSIS — J019 Acute sinusitis, unspecified: Secondary | ICD-10-CM

## 2023-03-10 DIAGNOSIS — B9689 Other specified bacterial agents as the cause of diseases classified elsewhere: Secondary | ICD-10-CM

## 2023-03-10 MED ORDER — AMOXICILLIN-POT CLAVULANATE 875-125 MG PO TABS: 1.0000 | ORAL_TABLET | Freq: Two times a day (BID) | ORAL | 0 refills | Status: DC

## 2023-03-10 NOTE — Patient Instructions (Signed)
[name], thank you for joining [name], PA-C for today's virtual visit.  While this provider is not your primary care provider (PCP), if your PCP is located in our provider database this encounter information will be shared with them immediately following your visit.   A [facility] MyChart account gives you access to today's visit and all your visits, tests, and labs performed at [facility] " click here if you don't have a [facility] MyChart account or go to [email or web address]  Consent: (Patient) Yarenis Cerino Habenicht provided verbal consent for this virtual visit at the beginning of the encounter.  Current Medications:  Current Outpatient Medications:    amoxicillin-clavulanate (AUGMENTIN) 875-125 MG tablet, Take 1 tablet by mouth 2 (two) times daily., Disp: 20 tablet, Rfl: 0   labetalol (NORMODYNE) 200 MG tablet, TAKE 2 TABLETS(400 MG) BY MOUTH TWICE DAILY, Disp: 120 tablet, Rfl: 1   NIFEdipine (ADALAT CC) 60 MG 24 hr tablet, Take 1 tablet (60 mg total) by mouth daily., Disp: 90 tablet, Rfl: 1   Medications ordered in this encounter:  Meds ordered this encounter  Medications   amoxicillin-clavulanate (AUGMENTIN) 875-125 MG tablet    Sig: Take 1 tablet by mouth 2 (two) times daily.    Dispense:  20 tablet    Refill:  0    Order Specific Question:   Supervising Provider    Answer:   Merrilee Jansky [id number]     *If you need refills on other medications prior to your next appointment, please contact your pharmacy*  Follow-Up: Call back or seek an in-person evaluation if the symptoms worsen or if the condition fails to improve as anticipated.  [facility] [phone]  Other Instructions  Sinus Infection, Adult A sinus infection, also called sinusitis, is inflammation of your sinuses. Sinuses are hollow spaces in the bones around your face. Your sinuses are located: Around your eyes. In the middle of your forehead. Behind your  nose. In your cheekbones. Mucus normally drains out of your sinuses. When your nasal tissues become inflamed or swollen, mucus can become trapped or blocked. This allows bacteria, viruses, and fungi to grow, which leads to infection. Most infections of the sinuses are caused by a virus. A sinus infection can develop quickly. It can last for up to 4 weeks (acute) or for more than 12 weeks (chronic). A sinus infection often develops after a cold. What are the causes? This condition is caused by anything that creates swelling in the sinuses or stops mucus from draining. This includes: Allergies. Asthma. Infection from bacteria or viruses. Deformities or blockages in your nose or sinuses. Abnormal growths in the nose (nasal polyps). Pollutants, such as chemicals or irritants in the air. Infection from fungi. This is rare. What increases the risk? You are more likely to develop this condition if you: Have a weak body defense system (immune system). Do a lot of swimming or diving. Overuse nasal sprays. Smoke. What are the signs or symptoms? The main symptoms of this condition are pain and a feeling of pressure around the affected sinuses. Other symptoms include: Stuffy nose or congestion that makes it difficult to breathe through your nose. Thick yellow or greenish drainage from your nose. Tenderness, swelling, and warmth over the affected sinuses. A cough that may get worse at night. Decreased sense of smell and taste. Extra mucus that collects in the throat or the back of the nose (postnasal drip) causing a sore throat or bad breath.  Tiredness (fatigue). Fever. How is this diagnosed? This condition is diagnosed based on: Your symptoms. Your medical history. A physical exam. Tests to find out if your condition is acute or chronic. This may include: Checking your nose for nasal polyps. Viewing your sinuses using a device that has a light (endoscope). Testing for allergies or  bacteria. Imaging tests, such as an MRI or CT scan. In rare cases, a bone biopsy may be done to rule out more serious types of fungal sinus disease. How is this treated? Treatment for a sinus infection depends on the cause and whether your condition is chronic or acute. If caused by a virus, your symptoms should go away on their own within 10 days. You may be given medicines to relieve symptoms. They include: Medicines that shrink swollen nasal passages (decongestants). A spray that eases inflammation of the nostrils (topical intranasal corticosteroids). Rinses that help get rid of thick mucus in your nose (nasal saline washes). Medicines that treat allergies (antihistamines). Over-the-counter pain relievers. If caused by bacteria, your health care provider may recommend waiting to see if your symptoms improve. Most bacterial infections will get better without antibiotic medicine. You may be given antibiotics if you have: A severe infection. A weak immune system. If caused by narrow nasal passages or nasal polyps, surgery may be needed. Follow these instructions at home: Medicines Take, use, or apply over-the-counter and prescription medicines only as told by your health care provider. These may include nasal sprays. If you were prescribed an antibiotic medicine, take it as told by your health care provider. Do not stop taking the antibiotic even if you start to feel better. Hydrate and humidify  Drink enough fluid to keep your urine pale yellow. Staying hydrated will help to thin your mucus. Use a cool mist humidifier to keep the humidity level in your home above 50%. Inhale steam for 10-15 minutes, 3-4 times a day, or as told by your health care provider. You can do this in the bathroom while a hot shower is running. Limit your exposure to cool or dry air. Rest Rest as much as possible. Sleep with your head raised (elevated). Make sure you get enough sleep each night. General  instructions  Apply a warm, moist washcloth to your face 3-4 times a day or as told by your health care provider. This will help with discomfort. Use nasal saline washes as often as told by your health care provider. Wash your hands often with soap and water to reduce your exposure to germs. If soap and water are not available, use hand sanitizer. Do not smoke. Avoid being around people who are smoking (secondhand smoke). Keep all follow-up visits. This is important. Contact a health care provider if: You have a fever. Your symptoms get worse. Your symptoms do not improve within 10 days. Get help right away if: You have a severe headache. You have persistent vomiting. You have severe pain or swelling around your face or eyes. You have vision problems. You develop confusion. Your neck is stiff. You have trouble breathing. These symptoms may be an emergency. Get help right away. Call 911. Do not wait to see if the symptoms will go away. Do not drive yourself to the hospital. Summary A sinus infection is soreness and inflammation of your sinuses. Sinuses are hollow spaces in the bones around your face. This condition is caused by nasal tissues that become inflamed or swollen. The swelling traps or blocks the flow of mucus. This allows bacteria, viruses,  and fungi to grow, which leads to infection. If you were prescribed an antibiotic medicine, take it as told by your health care provider. Do not stop taking the antibiotic even if you start to feel better. Keep all follow-up visits. This is important. This information is not intended to replace advice given to you by your health care provider. Make sure you discuss any questions you have with your health care provider. Document Revised: 07/15/2021 Document Reviewed: 07/15/2021 Elsevier Patient Education  2024 Elsevier Inc.    If you have been instructed to have an in-person evaluation today at a local Urgent Care facility, please use  the link below. It will take you to a list of all of our available [facility], including address, phone number and hours of operation. Please do not delay care.  [facility]  If you or a family member do not have a primary care provider, use the link below to schedule a visit and establish care. When you choose a [facility] primary care physician or advanced practice provider, you gain a long-term partner in health. Find a Primary Care Provider  Learn more about [facility]'s in-office and virtual care options: [facility] - Get Care Now

## 2023-03-10 NOTE — Progress Notes (Signed)
Virtual Visit Consent   Erin [name] [facility], you are scheduled for a virtual visit with a [facility] provider today. Just as with appointments in the office, your consent must be obtained to participate. Your consent will be active for this visit and any virtual visit you may have with one of our providers in the next 365 days. If you have a MyChart account, a copy of this consent can be sent to you electronically.  As this is a virtual visit, video technology does not allow for your provider to perform a traditional examination. This may limit your provider's ability to fully assess your condition. If your provider identifies any concerns that need to be evaluated in person or the need to arrange testing (such as labs, EKG, etc.), we will make arrangements to do so. Although advances in technology are sophisticated, we cannot ensure that it will always work on either your end or our end. If the connection with a video visit is poor, the visit may have to be switched to a telephone visit. With either a video or telephone visit, we are not always able to ensure that we have a secure connection.  By engaging in this virtual visit, you consent to the provision of healthcare and authorize for your insurance to be billed (if applicable) for the services provided during this visit. Depending on your insurance coverage, you may receive a charge related to this service.  I need to obtain your verbal consent now. Are you willing to proceed with your visit today? Erin [name] has provided verbal consent on 03/10/2023 for a virtual visit (video or telephone). Erin Loveless, PA-C  Date: 03/10/2023 8:21 AM  Virtual Visit via Video Note   I, Erin [name], connected with  Erin [name]  ([id number], [birth year]) on 03/10/23 at  8:15 AM EDT by a video-enabled telemedicine application and verified that I am speaking with the correct person using two identifiers.  Location: Patient: Virtual Visit Location  Patient: Work; isolated Provider: Virtual Visit Location Provider: Home Office   I discussed the limitations of evaluation and management by telemedicine and the availability of in person appointments. The patient expressed understanding and agreed to proceed.    History of Present Illness: Erin [name] is a 42 y.o. who identifies as a female who was assigned female at birth, and is being seen today for possible sinus infection.  HPI: Sinusitis This is a new problem. The current episode started in the past 7 days. The problem has been gradually worsening since onset. There has been no fever. Associated symptoms include congestion, diaphoresis (this morning), headaches, sinus pressure and a sore throat (from drainage). Pertinent negatives include no chills, coughing, ear pain, hoarse voice, sneezing or swollen glands. (Post nasal drainage) Treatments tried: decongestant, claritin. The treatment provided no relief.   At home covid test negative x 2   Problems:  Patient Active Problem List   Diagnosis Date Noted   Postpartum care following vaginal delivery 03/27/2021   Chronic hypertension complicating or reason for care during pregnancy, third trimester 03/26/2021   HTN in pregnancy, chronic 03/26/2021    Allergies: No Known Allergies Medications:  Current Outpatient Medications:    amoxicillin-clavulanate (AUGMENTIN) 875-125 MG tablet, Take 1 tablet by mouth 2 (two) times daily., Disp: 20 tablet, Rfl: 0   labetalol (NORMODYNE) 200 MG tablet, TAKE 2 TABLETS(400 MG) BY MOUTH TWICE DAILY, Disp: 120 tablet, Rfl: 1   NIFEdipine (ADALAT CC) 60 MG 24 hr tablet, Take  1 tablet (60 mg total) by mouth daily., Disp: 90 tablet, Rfl: 1  Observations/Objective: Patient is well-developed, well-nourished in no acute distress.  Resting comfortably  Head is normocephalic, atraumatic.  No labored breathing.  Speech is clear and coherent with logical content.  Patient is alert and oriented at baseline.     Assessment and Plan: 1. Acute bacterial sinusitis - amoxicillin-clavulanate (AUGMENTIN) 875-125 MG tablet; Take 1 tablet by mouth 2 (two) times daily.  Dispense: 20 tablet; Refill: 0  - Worsening symptoms that have not responded to OTC medications.  - Will give Augmentin - Continue allergy medications.  - Steam and humidifier can help - Stay well hydrated and get plenty of rest.  - Seek in person evaluation if no symptom improvement or if symptoms worsen   Follow Up Instructions: I discussed the assessment and treatment plan with the patient. The patient was provided an opportunity to ask questions and all were answered. The patient agreed with the plan and demonstrated an understanding of the instructions.  A copy of instructions were sent to the patient via MyChart unless otherwise noted below.    The patient was advised to call back or seek an in-person evaluation if the symptoms worsen or if the condition fails to improve as anticipated.  Time:  I spent 10 minutes with the patient via telehealth technology discussing the above problems/concerns.    Erin Loveless, PA-C

## 2023-03-30 ENCOUNTER — Ambulatory Visit: Payer: BC Managed Care – PPO | Attending: Student | Admitting: Student

## 2023-03-30 ENCOUNTER — Emergency Department (HOSPITAL_COMMUNITY): Payer: BC Managed Care – PPO

## 2023-03-30 ENCOUNTER — Encounter: Payer: Self-pay | Admitting: Student

## 2023-03-30 ENCOUNTER — Other Ambulatory Visit: Payer: Self-pay

## 2023-03-30 ENCOUNTER — Emergency Department (HOSPITAL_COMMUNITY)
Admission: EM | Admit: 2023-03-30 | Discharge: 2023-03-30 | Disposition: A | Discharge status: Home / Self Care | Payer: BC Managed Care – PPO | Source: Home / Self Care | Attending: Emergency Medicine | Admitting: Emergency Medicine

## 2023-03-30 ENCOUNTER — Encounter (HOSPITAL_COMMUNITY): Payer: Self-pay

## 2023-03-30 VITALS — BP 184/112 | HR 73 | Ht 64.0 in | Wt 263.2 lb

## 2023-03-30 DIAGNOSIS — I160 Hypertensive urgency: Secondary | ICD-10-CM | POA: Diagnosis not present

## 2023-03-30 DIAGNOSIS — R930 Abnormal findings on diagnostic imaging of skull and head, not elsewhere classified: Secondary | ICD-10-CM | POA: Diagnosis not present

## 2023-03-30 DIAGNOSIS — J329 Chronic sinusitis, unspecified: Secondary | ICD-10-CM

## 2023-03-30 DIAGNOSIS — Z1152 Encounter for screening for COVID-19: Secondary | ICD-10-CM | POA: Insufficient documentation

## 2023-03-30 DIAGNOSIS — R0789 Other chest pain: Secondary | ICD-10-CM | POA: Diagnosis not present

## 2023-03-30 DIAGNOSIS — R519 Headache, unspecified: Secondary | ICD-10-CM

## 2023-03-30 DIAGNOSIS — E876 Hypokalemia: Secondary | ICD-10-CM | POA: Diagnosis not present

## 2023-03-30 LAB — BASIC METABOLIC PANEL
Anion gap: 15 (ref 5–15)
BUN: 8 mg/dL (ref 6–20)
CO2: 25 mmol/L (ref 22–32)
Calcium: 9.4 mg/dL (ref 8.9–10.3)
Chloride: 97 mmol/L — ABNORMAL LOW (ref 98–111)
Creatinine, Ser: 0.76 mg/dL (ref 0.44–1.00)
GFR, Estimated: >60 mL/min (ref >60)
Glucose, Bld: 100 mg/dL — ABNORMAL HIGH (ref 70–99)
Potassium: 3.2 mmol/L — ABNORMAL LOW (ref 3.5–5.1)
Sodium: 137 mmol/L (ref 135–145)

## 2023-03-30 LAB — CBC WITH DIFFERENTIAL/PLATELET
Abs Immature Granulocytes: 0 10*3/uL (ref 0.00–0.07)
Basophils Absolute: 0.1 10*3/uL (ref 0.0–0.1)
Basophils Relative: 1 %
Eosinophils Absolute: 0.1 10*3/uL (ref 0.0–0.5)
Eosinophils Relative: 2 %
HCT: 38.9 % (ref 36.0–46.0)
Hemoglobin: 13 g/dL (ref 12.0–15.0)
Lymphocytes Relative: 35 %
Lymphs Abs: 1.9 10*3/uL (ref 0.7–4.0)
MCH: 30 pg (ref 26.0–34.0)
MCHC: 33.4 g/dL (ref 30.0–36.0)
MCV: 89.8 fL (ref 80.0–100.0)
Monocytes Absolute: 0.1 10*3/uL (ref 0.1–1.0)
Monocytes Relative: 2 %
Neutro Abs: 3.2 10*3/uL (ref 1.7–7.7)
Neutrophils Relative %: 60 %
Platelets: 217 10*3/uL (ref 150–400)
RBC: 4.33 MIL/uL (ref 3.87–5.11)
RDW: 12.8 % (ref 11.5–15.5)
WBC: 5.3 10*3/uL (ref 4.0–10.5)
nRBC: 0 % (ref 0.0–0.2)
nRBC: 0 /100 WBC

## 2023-03-30 LAB — TROPONIN I (HIGH SENSITIVITY): Troponin I (High Sensitivity): 3 ng/L (ref <18)

## 2023-03-30 LAB — HCG, SERUM, QUALITATIVE: Preg, Serum: NEGATIVE

## 2023-03-30 LAB — SARS CORONAVIRUS 2 BY RT PCR: SARS Coronavirus 2 by RT PCR: NEGATIVE

## 2023-03-30 MED ORDER — DEXAMETHASONE SODIUM PHOSPHATE 10 MG/ML IJ SOLN
6.0000 mg | Freq: Once | INTRAMUSCULAR | Status: AC
  Administered 2023-03-30: 6 mg via INTRAVENOUS
  Filled 2023-03-30: qty 1

## 2023-03-30 MED ORDER — SODIUM CHLORIDE 0.9 % IV BOLUS
1000.0000 mL | Freq: Once | INTRAVENOUS | Status: AC
  Administered 2023-03-30: 1000 mL via INTRAVENOUS

## 2023-03-30 MED ORDER — KETOROLAC TROMETHAMINE 30 MG/ML IJ SOLN
30.0000 mg | Freq: Once | INTRAMUSCULAR | Status: AC
  Administered 2023-03-30: 30 mg via INTRAVENOUS
  Filled 2023-03-30: qty 1

## 2023-03-30 MED ORDER — METOCLOPRAMIDE HCL 5 MG/ML IJ SOLN
10.0000 mg | Freq: Once | INTRAMUSCULAR | Status: AC
  Administered 2023-03-30: 10 mg via INTRAVENOUS
  Filled 2023-03-30: qty 2

## 2023-03-30 MED ORDER — DOXYCYCLINE HYCLATE 100 MG PO CAPS: 100.0000 mg | ORAL_CAPSULE | Freq: Two times a day (BID) | ORAL | 0 refills | Status: AC

## 2023-03-30 MED ORDER — POTASSIUM CHLORIDE ER 10 MEQ PO TBCR: 10.0000 meq | EXTENDED_RELEASE_TABLET | Freq: Every day | ORAL | 0 refills | Status: DC

## 2023-03-30 MED ORDER — DIPHENHYDRAMINE HCL 50 MG/ML IJ SOLN
12.5000 mg | Freq: Once | INTRAMUSCULAR | Status: AC
  Administered 2023-03-30: 12.5 mg via INTRAVENOUS
  Filled 2023-03-30: qty 1

## 2023-03-30 NOTE — Patient Instructions (Signed)
Medication Instructions:  No medication changes *If you need a refill on your cardiac medications before your next appointment, please call your pharmacy*   Lab Work: No labs ordered today If you have labs (blood work) drawn today and your tests are completely normal, you will receive your results only by: MyChart Message (if you have MyChart) OR A paper copy in the mail If you have any lab test that is abnormal or we need to change your treatment, we will call you to review the results.   Testing/Procedures: none   Follow-Up: At [facility], you and your health needs are our priority.  As part of our continuing mission to provide you with exceptional heart care, we have created designated Provider Care Teams.  These Care Teams include your primary Cardiologist (physician) and Advanced Practice Providers (APPs -  Physician Assistants and Nurse Practitioners) who all work together to provide you with the care you need, when you need it.  We recommend signing up for the patient portal called "MyChart".  Sign up information is provided on this After Visit Summary.  MyChart is used to connect with patients for Virtual Visits (Telemedicine).  Patients are able to view lab/test results, encounter notes, upcoming appointments, etc.  Non-urgent messages can be sent to your provider as well.   To learn more about what you can do with MyChart, go to [email or web address].    Your next appointment:    Next available with Cardiology Ob[facility]  Provider:   Thomasene Ripple, DO

## 2023-03-30 NOTE — Discharge Instructions (Addendum)
You are diagnosed with sinus inflammation and a possible sinus infection in your CT scan today.  I prescribed you antibiotics to take for the next 10 days to treat for possible sinus infection.  I recommend he continue taking ibuprofen and Tylenol as needed for headache, and Benadryl 25 mg can help at night.  I would expect her headache to improve over the next 1 to 2 days of these medications.  I also prescribed potassium as your potassium level was mildly low today.  We talked about your CT scan which showed possible signs of something called pseudotumor cerebri, or increased pressure inside your brain or skull.  Because of this finding, I recommended a follow-up with a neurologist.  I included some information for you to read over.  Please be aware that this is not a formal diagnosis - the information is for your REFERENCE only.  You likely need further testing as an outpatient.  *  You are having a headache. This condition is very commonly seen in the Emergency Department. No specific cause was found today for your headache. It may have been a migraine or another cause of headache. Stress, anxiety, fatigue, and depression are common triggers for headaches.   Fortunately, your headache today does not appear to be life-threatening or require hospitalization at this time. This may change in the future. Sometimes headaches can appear benign (not harmful), but then more serious symptoms can develop, which should prompt an immediate re-evaluation by your doctor or the emergency department.  It is very important that you speak to your primary care doctor in the next 48 hours, to re-evaluate your symptoms, and to determine whether you need any further diagnostic testing or treatment.   SEEK MEDICAL ATTENTION IF: - you develop reaction or side effects to the medications prescribed.  - your headache is not improving within 48 hours - you have multiple episodes of vomiting or can't drink fluids - you have a  change in pattern from your usual headache (eg. New location, new intensity, new symptoms, etc)  RETURN IMMEDIATELY IF you develop a sudden, severe worsening of your headache or confusion, become poorly responsive, feel like passing out, develop a fever above 100.62F, have a change in speech, vision, or swallowing, develop new weakness, numbness, tingling, or incoordination, or have a seizure.

## 2023-03-30 NOTE — Progress Notes (Signed)
Cardiology Office Note:    Date:  03/30/2023   ID:  Bunny, Hort 30-Mar-1981, MRN [id number]  PCP:  Leilani Able, MD  Cardiologist:  Thomasene Ripple, DO  Electrophysiologist:  None   Referring MD: Leilani Able, MD   Chief Complaint: hypertension and headache  History of Present Illness:    Erin [name] is a 42 y.o. female with a history of hypertension, PCOS, and obesity who is followed by Dr. Servando Salina and presents today for further evaluation of hypertension.  Patient states she initially started having issues with hypertension around 2020 when she was trying to get pregnant going through IVF. She lost weight and her BP improved. Her BP was monitored closely by her OBGYN and she did okay throughout her pregnancy. However, after she gave birth to her son in 03/2021, her BP spiked and she had multiple ED visit for this. She was referred to Dr. Mallory Sh[facility] in 05/2022 because she is hoping to get pregnant again. Her BP was 170.110 at that time. She was started on Nifedipine and her Labetolol was increased. Echo in 06/2022 showed   She was added onto my schedule today urgently for further hypertension of elevated BP and headache. BP initially 184/112 and then 174/120 on my personal recheck. She is very symptomatic. She states she had a sinus infection about 3 weeks ago and was treated with antibiotics and decongestant. Symptoms improved but she started having a lot of nasal congestion/ drainage again last week and was advised to restart the decongestant (Sudafed) which she did on 03/25/2023. Suspect this has contributed to her high BP. Yesterday afternoon, she started having a headache, eye pain, neck jaw pain, chest pain, and dizziness. She states she normally has a headache when her BP gets high and can have some chest pain and shortness of breath with this as well but she does normally have all the other symptoms. Her BP was 167/122 last night and then 150/106 this morning. She  felt like someone was pushing on her chest last night. She is still having some chest discomfort today but describes it has a dull aches. She is currently sitting in the exam room in the dark because her headache is so bad. She ranks it as as 12/10 on the pain scale. She is also very dizzy. She almost fell and passed out walking into our office and we had to put her in a wheel chair. Will send her to the ED for further evaluation/ management of hypertensive urgency/ emergency.  EKGs/Labs/Other Studies Reviewed:    The following studies were reviewed:  Echocardiogram 06/30/2022: 1. Left ventricular ejection fraction, by estimation, is 60 to 65%. The  left ventricle has normal function. The left ventricle has no regional  wall motion abnormalities. There is mild concentric left ventricular  hypertrophy. Left ventricular diastolic  parameters are indeterminate.   2. Right ventricular systolic function is normal. The right ventricular  size is normal.   3. The mitral valve is normal in structure. Mild mitral valve  regurgitation. No evidence of mitral stenosis.   4. The aortic valve is normal in structure. Aortic valve regurgitation is  not visualized. No aortic stenosis is present.   5. There is borderline dilatation of the ascending aorta, measuring 37  mm.   6. The inferior vena cava is normal in size with greater than 50%  respiratory variability, suggesting right atrial pressure of 3 mmHg.   EKG:  EKG ordered today.  EKG Interpretation Date/Time:  Tuesday March 30 2023 10:09:49 EDT Ventricular Rate:  73 PR Interval:  180 QRS Duration:  76 QT Interval:  410 QTC Calculation: 451 R Axis:   -18  Text Interpretation: Normal sinus rhythm Non-specific T wave changes Confirmed by Marjie Skiff [phone]) on 03/30/2023 10:32:39 AM    Recent Labs: No results found for requested labs within last 365 days.  Recent Lipid Panel No results found for: "CHOL", "TRIG", "HDL", "CHOLHDL", "VLDL",  "LDLCALC", "LDLDIRECT"  Physical Exam:    Vital Signs: BP (!) 184/112 (BP Location: Left Arm, Patient Position: Sitting, Cuff Size: Large)   Pulse 73   Ht 5\' 4"  (1.626 m)   Wt 263 lb 3.2 oz (119.4 kg)   SpO2 98%   BMI 45.18 kg/m     Wt Readings from Last 3 Encounters:  03/30/23 263 lb 3.2 oz (119.4 kg)  06/12/22 272 lb 9.6 oz (123.7 kg)  04/01/21 243 lb 1.6 oz (110.3 kg)     General: 42 y.o. morbidly obese African-American female appears uncomfortable. HEENT: Normocephalic and atraumatic. Sclera clear.  Neck: Supple. No JVD. Heart: RRR. Distinct S1 and S2. No murmurs, gallops, or rubs.  Lungs: No increased work of breathing. Clear to ausculation bilaterally. No wheezes, rhonchi, or rales.  Abdomen: Soft, non-distended, and non-tender to palpation.  Extremities: No significant lower extremity edema.    Skin: Warm and dry. Neuro: Alert and oriented x3. No focal deficits. Psych: Normal affect. Responds appropriately.  Assessment:    1. Hypertensive urgency     Plan:    Hypertensive Urgency Patient added unto my schedule today urgently for further evaluation of symptomatic hypertension. BP BP initially 184/112 and then 174/120 on my personal recheck. She is incredibly symptomatic with 12/10 headache, eye pain, neck/ jaw pain, chest discomfort, and severe dizziness (almost fell and past out walking into the office). EKG shows no acute ischemic changes compared to prior tracing. She started taking Sudafed with a decongestant at the end of last week - suspect this is contributing. Given how symptomatic she is, sending her to the ED for further evaluation and management of hypertensive urgency/ emergency. Recommend checking troponin. She may end up needing imaging of her head given headache and dizziness. However, will defer this to ED provider. Suspect She is currently on Labetalol 400mg  twice daily (she states she sometimes does not take evening dose) and Nifedipine 60mg  daily. Suspect  these will need to be up-titrated prior to discharge but will leave to hospital team. Of note, she is still hoping to have another child so limits what antihypertensives we can use.  Disposition: Follow up in after discharge from hospital with Dr. Servando Salina (in [facility] clinic). Will also arrange follow-up in the PharmD HTN Clinic.   Medication Adjustments/Labs and Tests Ordered: Current medicines are reviewed at length with the patient today.  Concerns regarding medicines are outlined above.  Orders Placed This Encounter  Procedures   AMB Referral to [facility] Pharm-[facility]   EKG 12-Lead   No orders of the defined types were placed in this encounter.   Patient Instructions  Medication Instructions:  No medication changes *If you need a refill on your cardiac medications before your next appointment, please call your pharmacy*   Lab Work: No labs ordered today If you have labs (blood work) drawn today and your tests are completely normal, you will receive your results only by: MyChart Message (if you have MyChart) OR A paper copy in the mail If you have  any lab test that is abnormal or we need to change your treatment, we will call you to review the results.   Testing/Procedures: none   Follow-Up: At [facility], you and your health needs are our priority.  As part of our continuing mission to provide you with exceptional heart care, we have created designated Provider Care Teams.  These Care Teams include your primary Cardiologist (physician) and Advanced Practice Providers (APPs -  Physician Assistants and Nurse Practitioners) who all work together to provide you with the care you need, when you need it.  We recommend signing up for the patient portal called "MyChart".  Sign up information is provided on this After Visit Summary.  MyChart is used to connect with patients for Virtual Visits (Telemedicine).  Patients are able to view lab/test results, encounter notes,  upcoming appointments, etc.  Non-urgent messages can be sent to your provider as well.   To learn more about what you can do with MyChart, go to [email or web address].    Your next appointment:    Next available with Cardiology Obstetrics Clinic  Provider:   Thomasene Ripple, DO        Signed, Corrin Parker, PA-C  03/30/2023 11:30 AM    [facility]

## 2023-03-30 NOTE — ED Triage Notes (Signed)
Pt reports severe headache with associated dizziness, photophobia, nausea and chest discomfort that got worse this morning. Pt has been battling a sinus infection for the past three weeks taking sudafed OTC but it has caused her BP to increase. Pt went to her cardiologist today who sent her here for further eval.

## 2023-03-30 NOTE — ED Notes (Signed)
Discharge instructions reviewed with patient. Pt verbalized understanding and had no further questions. Pt a&o x4 and accompanied by family at discharge.

## 2023-03-30 NOTE — ED Provider Notes (Signed)
[place] EMERGENCY DEPARTMENT AT [facility] Provider Note   CSN: [id number] Arrival date & time: 03/30/23  1141     History  Chief Complaint  Patient presents with   Headache   Dizziness   Chest Pain    Erin [name] is a 42 y.o. female presented to ED with complaint of headache, lightheadedness, chest pain.  Patient reports that she has been having a frontal headache on and off for 2 to 3 weeks, for which she has been taking Sudafed frequently for suspected sinus infection.  She does not have frequent headaches or migraines.  She reports the headache has become more severe over the past 24 hours and is mainly in the front part of her head.  She went to see her cardiologist today in the office and there was concern her blood pressure was also quite high, she been having intermittent chest pains yesterday.  She advised to come to the ED for further evaluation.  She takes nifedipine daily and is supposed to be taking labetalol 400 mg twice daily but tells me she has been skipping several days of the labetalol because she was "not sure how it interacted with the Sudafed".  She denies history of MI, coronary disease, diabetes.  HPI     Home Medications Prior to Admission medications   Medication Sig Start Date End Date Taking? Authorizing Provider  acetaminophen (TYLENOL) 500 MG tablet Take 1,000 mg by mouth 2 (two) times daily as needed for moderate pain or headache.   Yes [provider]  doxycycline (VIBRAMYCIN) 100 MG capsule Take 1 capsule (100 mg total) by mouth 2 (two) times daily for 10 days. 03/30/23 04/09/23 Yes Matie Dimaano, Kermit Balo, MD  labetalol (NORMODYNE) 200 MG tablet TAKE 2 TABLETS(400 MG) BY MOUTH TWICE DAILY 09/09/22  Yes Tobb, Kardie, DO  NIFEdipine (ADALAT CC) 60 MG 24 hr tablet Take 1 tablet (60 mg total) by mouth daily. 07/09/22  Yes Tobb, Kardie, DO  potassium chloride (KLOR-CON) 10 MEQ tablet Take 1 tablet (10 mEq total) by mouth daily for 15 doses.  03/30/23 04/14/23 Yes Terald Sleeper, MD  amoxicillin-clavulanate (AUGMENTIN) 875-125 MG tablet Take 1 tablet by mouth 2 (two) times daily. Patient not taking: Reported on 03/30/2023 03/10/23   [name], PA-C      Allergies    Patient has no known allergies.    Review of Systems   Review of Systems  Physical Exam Updated Vital Signs BP 130/78   Pulse 69   Temp 97.9 F (36.6 C)   Resp 18   LMP 03/21/2023 (Approximate)   SpO2 100%   Breastfeeding No  Physical Exam Constitutional:      General: She is not in acute distress. HENT:     Head: Normocephalic and atraumatic.  Eyes:     Conjunctiva/sclera: Conjunctivae normal.     Pupils: Pupils are equal, round, and reactive to light.  Cardiovascular:     Rate and Rhythm: Normal rate and regular rhythm.  Pulmonary:     Effort: Pulmonary effort is normal. No respiratory distress.  Abdominal:     General: There is no distension.     Tenderness: There is no abdominal tenderness.  Musculoskeletal:     Cervical back: Normal range of motion and neck supple.  Skin:    General: Skin is warm and dry.  Neurological:     General: No focal deficit present.     Mental Status: She is alert. Mental status is  at baseline.  Psychiatric:        Mood and Affect: Mood normal.        Behavior: Behavior normal.     ED Results / Procedures / Treatments   Labs (all labs ordered are listed, but only abnormal results are displayed) Labs Reviewed  BASIC METABOLIC PANEL - Abnormal; Notable for the following components:      Result Value   Potassium 3.2 (*)    Chloride 97 (*)    Glucose, Bld 100 (*)    All other components within normal limits  SARS CORONAVIRUS 2 BY RT PCR  CBC WITH DIFFERENTIAL/PLATELET  HCG, SERUM, QUALITATIVE  TROPONIN I (HIGH SENSITIVITY)    EKG None  Radiology CT Head Wo Contrast  Result Date: 03/30/2023 CLINICAL DATA:  Headache, increasing frequency or severity EXAM: CT HEAD WITHOUT CONTRAST TECHNIQUE:  Contiguous axial images were obtained from the base of the skull through the vertex without intravenous contrast. RADIATION DOSE REDUCTION: This exam was performed according to the departmental dose-optimization program which includes automated exposure control, adjustment of the mA and/or kV according to patient size and/or use of iterative reconstruction technique. COMPARISON:  CT Head 11/27/19 FINDINGS: Limitations: Assessment is limited due to poor signal noise ratio. Brain: No evidence of acute infarction, hemorrhage, hydrocephalus, extra-axial collection or mass lesion/mass effect. Enlarged and partially empty sella. Vascular: No hyperdense vessel or unexpected calcification. Skull: Normal. Negative for fracture or focal lesion. Sinuses/Orbits: No middle ear or mastoid effusion. Mucosal thickening bilateral maxillary sinuses. Orbits are unremarkable. Other: None. IMPRESSION: 1. Assessment is limited due to poor signal noise ratio. Within this limitation, no acute intracranial abnormality. 2. Mucosal thickening bilateral maxillary sinuses. 3. Enlarged and partially empty sella, which is nonspecific, but can be seen in the setting of idiopathic intracranial hypertension. Electronically Signed   By: Lorenza Cambridge M.D.   On: 03/30/2023 13:05   DG Chest 2 View  Result Date: 03/30/2023 CLINICAL DATA:  Chest discomfort EXAM: CHEST - 2 VIEW COMPARISON:  X-ray 11/24/2009 FINDINGS: The heart size and mediastinal contours are within normal limits. Both lungs are clear. No consolidation, pneumothorax or effusion. No edema. The visualized skeletal structures are unremarkable. Overlapping cardiac leads. IMPRESSION: No acute cardiopulmonary disease Electronically Signed   By: Karen Kays M.D.   On: 03/30/2023 13:00    Procedures Procedures    Medications Ordered in ED Medications  sodium chloride 0.9 % bolus 1,000 mL (0 mLs Intravenous Stopped 03/30/23 1549)  ketorolac (TORADOL) 30 MG/ML injection 30 mg (30 mg  Intravenous Given 03/30/23 1332)  metoCLOPramide (REGLAN) injection 10 mg (10 mg Intravenous Given 03/30/23 1332)  dexamethasone (DECADRON) injection 6 mg (6 mg Intravenous Given 03/30/23 1333)  diphenhydrAMINE (BENADRYL) injection 12.5 mg (12.5 mg Intravenous Given 03/30/23 1332)    ED Course/ Medical Decision Making/ A&P Clinical Course as of 03/30/23 1907  Tue Mar 30, 2023  1455 Significant improvement in headache with migraine medications.  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  At this time is not having visual loss or visual deficits, and she does not have a history of recurring headaches suspect that this is an emergent process.  However if she does develop ongoing headaches neurology would be a reasonable person to follow-up with.  The patient and her husband verbalized understanding and agreement [MT]    Clinical Course User Index [MT] Terald Sleeper,  MD                                 Medical Decision Making Amount and/or Complexity of Data Reviewed Labs: ordered. Radiology: ordered. ECG/medicine tests: ordered.  Risk Prescription drug management.   This patient presents to the ED with concern for headache, intermittent chest pains. This involves an extensive number of treatment options, and is a complaint that carries with it a high risk of complications and morbidity.  The differential diagnosis includes viral syndrome versus sinusitis versus complex migraine versus other  Chest pain may be multifactorial, but we can check EKG and troponin level and x-ray here.  Co-morbidities that complicate the patient evaluation: History of hypertension which is a cardiovascular risk factor   External records from outside source obtained and reviewed including cardiology office evaluation today noting significant hypertension and referral to the ED for hypertensive  urgency evaluation  I ordered and personally interpreted labs.  The pertinent results include:  no emergent findings  I ordered imaging studies including CT head, x-ray of the chest I independently visualized and interpreted imaging which showed sinus inflammation; artifact noted; empty sella turca I agree with the radiologist interpretation  The patient was maintained on a cardiac monitor.  I personally viewed and interpreted the cardiac monitored which showed an underlying rhythm of: Sinus rhythm  Per my interpretation the patient's ECG shows normal sinus rhythm no acute ischemic findings  I ordered medication including IV headache and migraine medications  I have reviewed the patients home medicines and have made adjustments as needed  Test Considered: Very low suspicion for [facility] or acute bacterial meningitis, or other life-threatening infection demyelinating disorder to warrant MR imaging of the brain or lumbar puncture at this time.  Low suspicion for acute pulmonary embolism or aortic dissection for CT angiogram imaging.   After the interventions noted above, I reevaluated the patient and found that they have: improved  Social Determinants of Health:I discussed the importance of medical compliance with the patient, particularly with labetalol, which can have rebound hypertension and tachycardic effects when you discontinue it.  We also discussed the continued use of Sudafed can drive a blood pressure.  Patient verbalized understanding and will adjust his medications and take them as prescribed  Dispostion:  After consideration of the diagnostic results and the patients response to treatment, I feel that the patent would benefit from outpatient follow up.         Final Clinical Impression(s) / ED Diagnoses Final diagnoses:  Nonintractable headache, unspecified chronicity pattern, unspecified headache type  Sinusitis, unspecified chronicity, unspecified location  Hypokalemia     Rx / DC Orders ED Discharge Orders          Ordered    Ambulatory referral to Neurology       Comments: An appointment is requested in approximately: 2 weeks Headache and CT imaging may be indicative of IIH   03/30/23 1457    doxycycline (VIBRAMYCIN) 100 MG capsule  2 times daily        03/30/23 1500    potassium chloride (KLOR-CON) 10 MEQ tablet  Daily        03/30/23 1500              Terald Sleeper, MD 03/30/23 Windell Moment

## 2023-04-05 ENCOUNTER — Ambulatory Visit: Payer: BC Managed Care – PPO | Attending: Cardiology | Admitting: Cardiology

## 2023-04-05 ENCOUNTER — Encounter: Payer: Self-pay | Admitting: Cardiology

## 2023-04-05 VITALS — BP 178/100 | HR 72 | Ht 63.0 in | Wt 266.8 lb

## 2023-04-05 DIAGNOSIS — R519 Headache, unspecified: Secondary | ICD-10-CM | POA: Diagnosis not present

## 2023-04-05 DIAGNOSIS — G8929 Other chronic pain: Secondary | ICD-10-CM | POA: Diagnosis not present

## 2023-04-05 DIAGNOSIS — I10 Essential (primary) hypertension: Secondary | ICD-10-CM

## 2023-04-05 MED ORDER — NIFEDIPINE ER OSMOTIC RELEASE 90 MG PO TB24: 90.0000 mg | ORAL_TABLET | Freq: Every day | ORAL | 3 refills | Status: DC

## 2023-04-05 MED ORDER — LABETALOL HCL 200 MG PO TABS: 200.0000 mg | ORAL_TABLET | Freq: Three times a day (TID) | ORAL | 3 refills | Status: DC

## 2023-04-05 NOTE — Patient Instructions (Addendum)
Medication Instructions:  Your physician has recommended you make the following change in your medication:  INCREASE: Labetalol 400 mg every 8 hours INCREASE: Nifedipine 90 mg once daily *If you need a refill on your cardiac medications before your next appointment, please call your pharmacy*   Lab Work: None   Testing/Procedures: None   Follow-Up: At [facility], you and your health needs are our priority.  As part of our continuing mission to provide you with exceptional heart care, we have created designated Provider Care Teams.  These Care Teams include your primary Cardiologist (physician) and Advanced Practice Providers (APPs -  Physician Assistants and Nurse Practitioners) who all work together to provide you with the care you need, when you need it.  Your next appointment:   16 week(s)  Provider:   Thomasene Ripple, DO     Other Instructions Please purchase a blood pressure cuff. Omron is the recommended brand.  Keep your appt with Thayer Ohm - our pharmacist.

## 2023-04-05 NOTE — Progress Notes (Signed)
Cardio-Obstetrics Clinic  Follow Up Note   Date:  04/05/2023   ID:  Erin, [name] 24-Aug-1981, MRN [id number]  PCP:  Erin Able, MD   [facility] Providers Cardiologist:  Erin Ripple, DO  Electrophysiologist:  None        Referring MD: Erin Able, MD   Chief Complaint: " I am ok"  History of Present Illness:    Erin [name] is a 42 y.o. female [G3P1021] who returns for follow up of chronic hypertension.   Medical history includes PCOS, morbid obesity, hypertension.   I initially saw that patient in October 2023 at the request of Erin [name] - this was for preconception counseling. During that visit, she was hypertensive, I increased her Labetalol and added nifedipine. Requested an eight weeks follow up. But she was lost to follow up.  I order an echo, sleep study. 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  She called the office recently due to headaches and elevated blood pressure. She was brought in urgently - she did see Erin Skiff, PA. During that visit evidence of hypertensive urgency. She was appropriately send to the ED.   Here today for a follow up visit. Still does have headaches.    Prior CV Studies Reviewed: The following studies were reviewed today: Reviewed TTE   Past Medical History:  Diagnosis Date   Obesity    PCOS (polycystic ovarian syndrome)    PID (pelvic inflammatory disease)    Ruptured ovarian cyst    Vaginal Pap smear, abnormal     Past Surgical History:  Procedure Laterality Date   DILATION AND CURETTAGE OF UTERUS     LAPAROSCOPY        OB History     Gravida  3   Para  1   Term  1   Preterm      AB  2   Living  1      SAB  2   IAB      Ectopic      Multiple  0   Live Births  1               Current Medications: Current Meds  Medication Sig   doxycycline (VIBRAMYCIN) 100 MG capsule Take 1 capsule (100 mg total) by mouth 2 (two) times daily for 10 days.   labetalol (NORMODYNE) 200  MG tablet Take 1 tablet (200 mg total) by mouth every 8 (eight) hours.   NIFEdipine (PROCARDIA XL/NIFEDICAL-XL) 90 MG 24 hr tablet Take 1 tablet (90 mg total) by mouth daily.   potassium chloride (KLOR-CON) 10 MEQ tablet Take 1 tablet (10 mEq total) by mouth daily for 15 doses.   [DISCONTINUED] labetalol (NORMODYNE) 200 MG tablet TAKE 2 TABLETS(400 MG) BY MOUTH TWICE DAILY   [DISCONTINUED] NIFEdipine (ADALAT CC) 60 MG 24 hr tablet Take 1 tablet (60 mg total) by mouth daily.     Allergies:   Patient has no known allergies.   Social History   Socioeconomic History   Marital status: Married    Spouse name: Erin [name]   Number of children: Not on file   Years of education: Not on file   Highest education level: Not on file  Occupational History   Not on file  Tobacco Use   Smoking status: Former   Smokeless tobacco: Never  Vaping Use   Vaping status: Never Used  Substance and Sexual Activity   Alcohol use: Not Currently  Comment: occ   Drug use: Never   Sexual activity: Not Currently    Birth control/protection: None  Other Topics Concern   Not on file  Social History Narrative   Not on file   Social Determinants of Health   Financial Resource Strain: Not on file  Food Insecurity: Not on file  Transportation Needs: Not on file  Physical Activity: Not on file  Stress: Not on file  Social Connections: Unknown (03/31/2023)   Received from [facility]   Social Network    Social Network: Not on file      Family History  Problem Relation Age of Onset   Asthma Brother    Cancer Maternal Grandmother    Cancer Maternal Grandfather    Diabetes Maternal Grandfather    Stroke Paternal Grandfather       ROS:   Please see the history of present illness.     All other systems reviewed and are negative.   Labs/EKG Reviewed:    EKG:   EKG was not ordered today.    Recent Labs: 03/30/2023: BUN 8; Creatinine, Ser 0.76; Hemoglobin 13.0; Platelets 217; Potassium 3.2;  Sodium 137   Recent Lipid Panel No results found for: "CHOL", "TRIG", "HDL", "CHOLHDL", "LDLCALC", "LDLDIRECT"  Physical Exam:    VS:  BP (!) 178/100 (BP Location: Left Arm, Patient Position: Sitting, Cuff Size: Large)   Pulse 72   Ht 5\' 3"  (1.6 m)   Wt 266 lb 12.8 oz (121 kg)   LMP 03/21/2023 (Approximate)   SpO2 98%   BMI 47.26 kg/m     Wt Readings from Last 3 Encounters:  04/05/23 266 lb 12.8 oz (121 kg)  03/30/23 263 lb 3.2 oz (119.4 kg)  06/12/22 272 lb 9.6 oz (123.7 kg)     GEN:  Well nourished, well developed in no acute distress HEENT: Normal NECK: No JVD; No carotid bruits LYMPHATICS: No lymphadenopathy CARDIAC: RRR, no murmurs, rubs, gallops RESPIRATORY:  Clear to auscultation without rales, wheezing or rhonchi  ABDOMEN: Soft, non-tender, non-distended MUSCULOSKELETAL:  No edema; No deformity  SKIN: Warm and dry NEUROLOGIC:  Alert and oriented x 3 PSYCHIATRIC:  Normal affect    Risk Assessment/Risk Calculators:                 ASSESSMENT & PLAN:    Accelerated hypertension - blood pressure still elevated, manually taken by me.  Will increase Labetalol to 400 mg every 8 hrs,Increase Nifedipine to 60 mg daily.  She is still interested in conception - so if not control with the regimen above - plan to add hydralazine.  She will see our pharmacist team in close follow up.  Diet and exercise modification advised.  Patient Instructions  Medication Instructions:  Your physician has recommended you make the following change in your medication:  INCREASE: Labetalol 400 mg every 8 hours INCREASE: Nifedipine 90 mg once daily *If you need a refill on your cardiac medications before your next appointment, please call your pharmacy*   Lab Work: None   Testing/Procedures: None   Follow-Up: At [facility], you and your health needs are our priority.  As part of our continuing mission to provide you with exceptional heart care, we have created  designated Provider Care Teams.  These Care Teams include your primary Cardiologist (physician) and Advanced Practice Providers (APPs -  Physician Assistants and Nurse Practitioners) who all work together to provide you with the care you need, when you need it.  Your next  appointment:   16 week(s)  Provider:   Thomasene Ripple, DO     Other Instructions Please purchase a blood pressure cuff. Omron is the recommended brand.  Keep your appt with Thayer Ohm - our pharmacist.    Dispo:  No follow-ups on file.   Medication Adjustments/Labs and Tests Ordered: Current medicines are reviewed at length with the patient today.  Concerns regarding medicines are outlined above.  Tests Ordered: No orders of the defined types were placed in this encounter.  Medication Changes: Meds ordered this encounter  Medications   labetalol (NORMODYNE) 200 MG tablet    Sig: Take 1 tablet (200 mg total) by mouth every 8 (eight) hours.    Dispense:  540 tablet    Refill:  3   NIFEdipine (PROCARDIA XL/NIFEDICAL-XL) 90 MG 24 hr tablet    Sig: Take 1 tablet (90 mg total) by mouth daily.    Dispense:  90 tablet    Refill:  3

## 2023-04-06 ENCOUNTER — Encounter: Payer: Self-pay | Admitting: Cardiology

## 2023-04-08 MED ORDER — NIFEDIPINE ER 60 MG PO TB24
60.0000 mg | ORAL_TABLET | Freq: Every day | ORAL | 2 refills | Status: DC
Start: 2023-04-08 — End: 2023-04-15

## 2023-04-08 NOTE — Addendum Note (Signed)
Addended by: Hayden Pedro A on: 04/08/2023 09:50 AM   Modules accepted: Orders

## 2023-04-12 ENCOUNTER — Ambulatory Visit: Payer: BC Managed Care – PPO | Admitting: Neurology

## 2023-04-15 ENCOUNTER — Other Ambulatory Visit (HOSPITAL_BASED_OUTPATIENT_CLINIC_OR_DEPARTMENT_OTHER): Payer: Self-pay

## 2023-04-15 ENCOUNTER — Telehealth: Payer: Self-pay | Admitting: Pharmacist

## 2023-04-15 ENCOUNTER — Ambulatory Visit: Payer: BC Managed Care – PPO | Attending: Cardiovascular Disease | Admitting: Pharmacist

## 2023-04-15 VITALS — BP 142/95 | Wt 272.2 lb

## 2023-04-15 DIAGNOSIS — I10 Essential (primary) hypertension: Secondary | ICD-10-CM

## 2023-04-15 MED ORDER — BLOOD PRESSURE MONITOR MISC
1.0000 | Freq: Every day | 0 refills | Status: AC
  Filled 2023-04-15: qty 1, 30d supply, fill #0

## 2023-04-15 MED ORDER — HYDROCHLOROTHIAZIDE 25 MG PO TABS
25.0000 mg | ORAL_TABLET | Freq: Every day | ORAL | 2 refills | Status: DC
Start: 2023-04-15 — End: 2023-09-14
  Filled 2023-04-15: qty 30, 30d supply, fill #0
  Filled 2023-06-04: qty 30, 30d supply, fill #1
  Filled 2023-06-21: qty 30, 30d supply, fill #2

## 2023-04-15 NOTE — Patient Instructions (Addendum)
It was nice meeting you today  We would like your blood pressure to be less than 140/90  You can discontinue your labetalol and nifedipine at time  We will start a new medication called hydrochlorothiazide once a day in the morning  I will complete the prior authorization for [facility] and contact you with the result  Please discontinue all new medications and contact us if you become pregnant  Laural Golden, PharmD, BCACP, CDCES, CPP [address] [place], [place], [address] Phone: [phone], Fax: [phone]

## 2023-04-15 NOTE — Telephone Encounter (Signed)
PA for [facility] submitted. Key: [id number]

## 2023-04-15 NOTE — Progress Notes (Signed)
Patient ID: [name]                 DOB: Dec 27, 1980                      MRN: [id number]     HPI: Erin [name] is a 42 y.o. female referred by Dr. Servando Salina to HTN clinic. PMH is significant for HTN post pregnancy, PCOS, and obesity.   Patient has been managed on labetalol and nifedipine since last pregnancy since she had expressed interest in becoming pregnant again. However, now that her blood pressure has become uncontrolled and she has put on weight, wants to get her health in order first before making pregnancy plans again.  Has been checking BP at home but did not bring log. Is going to purchase a new BP cuff today from [employer]. Advised she can have insurance pay for it at [facility] pharmacy and she is agreeable.  Is also interested in weight loss medications since she knows it will help her BP and PCOS symptoms. Was approved for [facility] last year however was never able to start it due to it being on backorder.  Current HTN meds:  Labetalol 200mg  TID Nifedipine 60mg  daily  BP goal: <140/90  Wt Readings from Last 3 Encounters:  04/15/23 272 lb 3.2 oz (123.5 kg)  04/05/23 266 lb 12.8 oz (121 kg)  03/30/23 263 lb 3.2 oz (119.4 kg)   BP Readings from Last 3 Encounters:  04/15/23 (!) 142/95  04/05/23 (!) 178/100  03/30/23 130/78   Pulse Readings from Last 3 Encounters:  04/05/23 72  03/30/23 69  03/30/23 73    Renal function: Estimated Creatinine Clearance: 119.2 mL/min (by C-G formula based on SCr of 0.76 mg/dL).  Past Medical History:  Diagnosis Date   Obesity    PCOS (polycystic ovarian syndrome)    PID (pelvic inflammatory disease)    Ruptured ovarian cyst    Vaginal Pap smear, abnormal     Current Outpatient Medications on File Prior to Visit  Medication Sig Dispense Refill   acetaminophen (TYLENOL) 500 MG tablet Take 1,000 mg by mouth 2 (two) times daily as needed for moderate pain or headache. (Patient not taking: Reported on 04/05/2023)     amoxicillin-clavulanate  (AUGMENTIN) 875-125 MG tablet Take 1 tablet by mouth 2 (two) times daily. (Patient not taking: Reported on 04/05/2023) 20 tablet 0   labetalol (NORMODYNE) 200 MG tablet Take 1 tablet (200 mg total) by mouth every 8 (eight) hours. (Patient taking differently: Take 400 mg by mouth 2 (two) times daily. Patient taking 400 twice daily) 540 tablet 3   NIFEdipine (ADALAT CC) 60 MG 24 hr tablet Take 1 tablet (60 mg total) by mouth daily. 30 tablet 2   potassium chloride (KLOR-CON) 10 MEQ tablet Take 1 tablet (10 mEq total) by mouth daily for 15 doses. 15 tablet 0   No current facility-administered medications on file prior to visit.    No Known Allergies   Assessment/Plan:  1. Hypertension -  Patinet BP in room today 142/95 which is slightly above goal. Had long discussion regarding patients pregnancy plans. Patient not interested in becoming pregnant at this time due to her HTN and weight gain. Not using contraceptives however she does have fertility problems. Instructed patient to immediately contact clinic if she becomes pregnant and she is agreeable.  Since her pregnany plans have changed, can change antihypertensive regimen. Will d/c labetalol and nifedipine at this time and  start hydrochlorothiazide in mornings. Will check BMP in 1-2 weeks. Follow up in 4 weeks and consider amlodipine at the time.  2. Weight loss - Patient BMI 48.23 putting her in the severely obese category. Patient interested in starting [facility] again. Will complete PA and contact patient with result.  Laural Golden, PharmD, BCACP, CDCES, CPP [address] [place], [place], [address] Phone: [phone], Fax: [phone]

## 2023-04-21 ENCOUNTER — Other Ambulatory Visit (HOSPITAL_BASED_OUTPATIENT_CLINIC_OR_DEPARTMENT_OTHER): Payer: Self-pay

## 2023-04-22 NOTE — Telephone Encounter (Signed)
Resubmitted PA. Key: [id number]

## 2023-04-23 DIAGNOSIS — Z1231 Encounter for screening mammogram for malignant neoplasm of breast: Secondary | ICD-10-CM | POA: Diagnosis not present

## 2023-04-27 ENCOUNTER — Other Ambulatory Visit (HOSPITAL_BASED_OUTPATIENT_CLINIC_OR_DEPARTMENT_OTHER): Payer: Self-pay

## 2023-04-28 ENCOUNTER — Encounter: Payer: Self-pay | Admitting: Pharmacist

## 2023-04-28 DIAGNOSIS — I10 Essential (primary) hypertension: Secondary | ICD-10-CM

## 2023-04-28 MED ORDER — WEGOVY 0.25 MG/0.5ML ~~LOC~~ SOAJ: 0.2500 mg | SUBCUTANEOUS | Status: DC

## 2023-04-28 NOTE — Addendum Note (Signed)
Addended by: Cheree Ditto on: 04/28/2023 09:18 AM   Modules accepted: Orders

## 2023-04-30 DIAGNOSIS — G932 Benign intracranial hypertension: Secondary | ICD-10-CM | POA: Diagnosis not present

## 2023-04-30 NOTE — Addendum Note (Signed)
Addended by: Cheree Ditto on: 04/30/2023 12:50 PM   Modules accepted: Orders

## 2023-05-07 DIAGNOSIS — R519 Headache, unspecified: Secondary | ICD-10-CM | POA: Diagnosis not present

## 2023-05-07 DIAGNOSIS — G8929 Other chronic pain: Secondary | ICD-10-CM | POA: Diagnosis not present

## 2023-05-14 ENCOUNTER — Encounter: Payer: Self-pay | Admitting: Pharmacist

## 2023-05-14 ENCOUNTER — Other Ambulatory Visit (HOSPITAL_BASED_OUTPATIENT_CLINIC_OR_DEPARTMENT_OTHER): Payer: Self-pay

## 2023-05-14 ENCOUNTER — Ambulatory Visit: Payer: BC Managed Care – PPO | Attending: Cardiovascular Disease | Admitting: Pharmacist

## 2023-05-14 VITALS — BP 119/84 | HR 99

## 2023-05-14 DIAGNOSIS — I10 Essential (primary) hypertension: Secondary | ICD-10-CM | POA: Diagnosis not present

## 2023-05-14 MED ORDER — WEGOVY 0.5 MG/0.5ML ~~LOC~~ SOAJ
0.5000 mg | SUBCUTANEOUS | 0 refills | Status: DC
Start: 2023-05-14 — End: 2023-06-18
  Filled 2023-05-14: qty 2, 28d supply, fill #0

## 2023-05-14 NOTE — Patient Instructions (Addendum)
It was good seeing you again  Your blood pressure today looks excellent  Please continue your hydrochlorothiazide 25mg  once a day in the morning  I have sent in the next dose of Wegovy for you  Please update your lab work at your earliest convenience  Send me a message when you need your next [facility] refill  Laural Golden, PharmD, BCACP, CDCES, CPP [address] [place], [place], [address] Phone: [phone], Fax: [phone]

## 2023-05-14 NOTE — Progress Notes (Signed)
Patient ID: Erin [name]                 DOB: [birth year]                      MRN: [id number]     HPI: Erin [name] is a 42 y.o. female referred by Dr. Servando Salina to HTN clinic. PMH is significant for HTN post pregnancy, PCOS, and obesity.   Patient had been managed on labetalol and nifedipine since last pregnancy since she had expressed interest in becoming pregnant again. Her pregnancy plans had changed so patient was started on hydrochlorothiazide 25mg  and asked to keep a BP log. Was able to pick up a new BP cuff from [facility].  Patient also approved for [facility] and has been taking for 3 weeks without adverse effects.  Received lumbar puncture on 05/07/23 for headache relief. Started on acetazolamide.  Patient has not updated lab work yet.  Current HTN meds:  Hydrochlorothiazide 25mg  daily  BP goal: <140/90  Wt Readings from Last 3 Encounters:  04/15/23 272 lb 3.2 oz (123.5 kg)  04/05/23 266 lb 12.8 oz (121 kg)  03/30/23 263 lb 3.2 oz (119.4 kg)   BP Readings from Last 3 Encounters:  04/15/23 (!) 142/95  04/05/23 (!) 178/100  03/30/23 130/78   Pulse Readings from Last 3 Encounters:  04/05/23 72  03/30/23 69  03/30/23 73    Renal function: Estimated Creatinine Clearance: 119.2 mL/min (by C-G formula based on SCr of 0.76 mg/dL).  Past Medical History:  Diagnosis Date   Obesity    PCOS (polycystic ovarian syndrome)    PID (pelvic inflammatory disease)    Ruptured ovarian cyst    Vaginal Pap smear, abnormal     Current Outpatient Medications on File Prior to Visit  Medication Sig Dispense Refill   acetaminophen (TYLENOL) 500 MG tablet Take 1,000 mg by mouth 2 (two) times daily as needed for moderate pain or headache. (Patient not taking: Reported on 04/05/2023)     amoxicillin-clavulanate (AUGMENTIN) 875-125 MG tablet Take 1 tablet by mouth 2 (two) times daily. (Patient not taking: Reported on 04/05/2023) 20 tablet 0   labetalol (NORMODYNE) 200 MG tablet Take 1 tablet (200  mg total) by mouth every 8 (eight) hours. (Patient taking differently: Take 400 mg by mouth 2 (two) times daily. Patient taking 400 twice daily) 540 tablet 3   NIFEdipine (ADALAT CC) 60 MG 24 hr tablet Take 1 tablet (60 mg total) by mouth daily. 30 tablet 2   potassium chloride (KLOR-CON) 10 MEQ tablet Take 1 tablet (10 mEq total) by mouth daily for 15 doses. 15 tablet 0   No current facility-administered medications on file prior to visit.    No Known Allergies   Assessment/Plan:  1. Hypertension -  Patient BP today much improved at 119/84 which is at goal of <140/90. Will continue hydrochlorothiazide 25mg  once daily and encouraged patient to update lab work to check renal function.   Per patient request, will update A1c and send in next strength of Wegovy. Follow up as needed.   Continue hydrochlorothiazide 25mg  daily Follow up as needed  Laural Golden, PharmD, BCACP, CDCES, CPP [address] [place], [place], [address] Phone: [phone], Fax: [phone]

## 2023-05-17 DIAGNOSIS — G932 Benign intracranial hypertension: Secondary | ICD-10-CM | POA: Diagnosis not present

## 2023-05-18 ENCOUNTER — Other Ambulatory Visit (HOSPITAL_BASED_OUTPATIENT_CLINIC_OR_DEPARTMENT_OTHER): Payer: Self-pay

## 2023-05-19 ENCOUNTER — Other Ambulatory Visit: Payer: Self-pay

## 2023-05-20 LAB — BASIC METABOLIC PANEL
BUN/Creatinine Ratio: 14 (ref 9–23)
BUN: 14 mg/dL (ref 6–24)
CO2: 23 mmol/L (ref 20–29)
Calcium: 9.5 mg/dL (ref 8.7–10.2)
Chloride: 100 mmol/L (ref 96–106)
Creatinine, Ser: 0.97 mg/dL (ref 0.57–1.00)
Glucose: 97 mg/dL (ref 70–99)
Potassium: 3.7 mmol/L (ref 3.5–5.2)
Sodium: 137 mmol/L (ref 134–144)
eGFR: 76 mL/min/{1.73_m2} (ref >59)

## 2023-05-20 LAB — HEMOGLOBIN A1C
Est. average glucose Bld gHb Est-mCnc: 105 mg/dL
Hgb A1c MFr Bld: 5.3 % (ref 4.8–5.6)

## 2023-05-25 ENCOUNTER — Encounter: Payer: Self-pay | Admitting: Neurology

## 2023-06-04 ENCOUNTER — Other Ambulatory Visit: Payer: Self-pay

## 2023-06-04 ENCOUNTER — Other Ambulatory Visit (HOSPITAL_COMMUNITY): Payer: Self-pay

## 2023-06-11 ENCOUNTER — Other Ambulatory Visit (HOSPITAL_BASED_OUTPATIENT_CLINIC_OR_DEPARTMENT_OTHER): Payer: Self-pay

## 2023-06-11 ENCOUNTER — Telehealth: Payer: BC Managed Care – PPO | Admitting: Physician Assistant

## 2023-06-11 DIAGNOSIS — B9689 Other specified bacterial agents as the cause of diseases classified elsewhere: Secondary | ICD-10-CM | POA: Diagnosis not present

## 2023-06-11 DIAGNOSIS — J019 Acute sinusitis, unspecified: Secondary | ICD-10-CM | POA: Diagnosis not present

## 2023-06-11 DIAGNOSIS — B9789 Other viral agents as the cause of diseases classified elsewhere: Secondary | ICD-10-CM

## 2023-06-11 MED ORDER — AMOXICILLIN-POT CLAVULANATE 875-125 MG PO TABS
1.0000 | ORAL_TABLET | Freq: Two times a day (BID) | ORAL | 0 refills | Status: DC
Start: 2023-06-11 — End: 2023-06-24
  Filled 2023-06-11: qty 20, 10d supply, fill #0

## 2023-06-11 MED ORDER — FLUTICASONE PROPIONATE 50 MCG/ACT NA SUSP
2.0000 | Freq: Every day | NASAL | 0 refills | Status: DC
Start: 2023-06-11 — End: 2024-05-15
  Filled 2023-06-11: qty 16, 30d supply, fill #0

## 2023-06-11 NOTE — Progress Notes (Signed)
E-Visit for Sinus Problems  We are sorry that you are not feeling well.  Here is how we plan to help!  Based on what you have shared with me it looks like you have sinusitis.  Sinusitis is inflammation and infection in the sinus cavities of the head.  Based on your presentation I believe you most likely have Acute Viral Sinusitis.This is an infection most likely caused by a virus. There is not specific treatment for viral sinusitis other than to help you with the symptoms until the infection runs its course.  You may use an oral decongestant such as Mucinex D or if you have glaucoma or high blood pressure use plain Mucinex. Saline nasal spray help and can safely be used as often as needed for congestion, I have prescribed: Fluticasone nasal spray two sprays in each nostril once a day  Some authorities believe that zinc sprays or the use of Echinacea may shorten the course of your symptoms.  Sinus infections are not as easily transmitted as other respiratory infection, however we still recommend that you avoid close contact with loved ones, especially the very young and elderly.  Remember to wash your hands thoroughly throughout the day as this is the number one way to prevent the spread of infection!  Home Care: Only take medications as instructed by your medical team. Do not take these medications with alcohol. A steam or ultrasonic humidifier can help congestion.  You can place a towel over your head and breathe in the steam from hot water coming from a faucet. Avoid close contacts especially the very young and the elderly. Cover your mouth when you cough or sneeze. Always remember to wash your hands.  Get Help Right Away If: You develop worsening fever or sinus pain. You develop a severe head ache or visual changes. Your symptoms persist after you have completed your treatment plan.  Make sure you Understand these instructions. Will watch your condition. Will get help right away if you  are not doing well or get worse.   Thank you for choosing an e-visit.  Your e-visit answers were reviewed by a board certified advanced clinical practitioner to complete your personal care plan. Depending upon the condition, your plan could have included both over the counter or prescription medications.  Please review your pharmacy choice. Make sure the pharmacy is open so you can pick up prescription now. If there is a problem, you may contact your provider through MyChart messaging and have the prescription routed to another pharmacy.  Your safety is important to us. If you have drug allergies check your prescription carefully.   For the next 24 hours you can use MyChart to ask questions about today's visit, request a non-urgent call back, or ask for a work or school excuse. You will get an email in the next two days asking about your experience. I hope that your e-visit has been valuable and will speed your recovery.  I have spent 5 minutes in review of e-visit questionnaire, review and updating patient chart, medical decision making and response to patient.   Matis Monnier M Daysia Vandenboom, PA-C  

## 2023-06-11 NOTE — Patient Instructions (Signed)
[name], thank you for joining [name], PA-C for today's virtual visit.  While this provider is not your primary care provider (PCP), if your PCP is located in our provider database this encounter information will be shared with them immediately following your visit.   A [facility] MyChart account gives you access to today's visit and all your visits, tests, and labs performed at [facility] " click here if you don't have a [facility] MyChart account or go to [email or web address]  Consent: (Patient) Erin [name] provided verbal consent for this virtual visit at the beginning of the encounter.  Current Medications:  Current Outpatient Medications:    amoxicillin-clavulanate (AUGMENTIN) 875-125 MG tablet, Take 1 tablet by mouth 2 (two) times daily., Disp: 20 tablet, Rfl: 0   acetaZOLAMIDE (DIAMOX) 125 MG tablet, Take 125 mg by mouth 2 (two) times daily., Disp: , Rfl:    Blood Pressure Monitor MISC, Use to check blood pressure daily., Disp: 1 each, Rfl: 0   fluticasone (FLONASE) 50 MCG/ACT nasal spray, Place 2 sprays into both nostrils daily., Disp: 16 g, Rfl: 0   hydrochlorothiazide (HYDRODIURIL) 25 MG tablet, Take 1 tablet (25 mg total) by mouth daily., Disp: 30 tablet, Rfl: 2   Semaglutide-Weight Management (WEGOVY) 0.5 MG/0.5ML SOAJ, Inject 0.5 mg into the skin once a week., Disp: 2 mL, Rfl: 0   Medications ordered in this encounter:  Meds ordered this encounter  Medications   amoxicillin-clavulanate (AUGMENTIN) 875-125 MG tablet    Sig: Take 1 tablet by mouth 2 (two) times daily.    Dispense:  20 tablet    Refill:  0    Order Specific Question:   Supervising Provider    Answer:   Merrilee Jansky [id number]     *If you need refills on other medications prior to your next appointment, please contact your pharmacy*  Follow-Up: Call back or seek an in-person evaluation if the symptoms worsen or if the condition fails to improve as  anticipated.  [facility] [phone]  Other Instructions Sinus Infection, Adult A sinus infection, also called sinusitis, is inflammation of your sinuses. Sinuses are hollow spaces in the bones around your face. Your sinuses are located: Around your eyes. In the middle of your forehead. Behind your nose. In your cheekbones. Mucus normally drains out of your sinuses. When your nasal tissues become inflamed or swollen, mucus can become trapped or blocked. This allows bacteria, viruses, and fungi to grow, which leads to infection. Most infections of the sinuses are caused by a virus. A sinus infection can develop quickly. It can last for up to 4 weeks (acute) or for more than 12 weeks (chronic). A sinus infection often develops after a cold. What are the causes? This condition is caused by anything that creates swelling in the sinuses or stops mucus from draining. This includes: Allergies. Asthma. Infection from bacteria or viruses. Deformities or blockages in your nose or sinuses. Abnormal growths in the nose (nasal polyps). Pollutants, such as chemicals or irritants in the air. Infection from fungi. This is rare. What increases the risk? You are more likely to develop this condition if you: Have a weak body defense system (immune system). Do a lot of swimming or diving. Overuse nasal sprays. Smoke. What are the signs or symptoms? The main symptoms of this condition are pain and a feeling of pressure around the affected sinuses. Other symptoms include: Stuffy nose or congestion that makes it difficult to breathe  through your nose. Thick yellow or greenish drainage from your nose. Tenderness, swelling, and warmth over the affected sinuses. A cough that may get worse at night. Decreased sense of smell and taste. Extra mucus that collects in the throat or the back of the nose (postnasal drip) causing a sore throat or bad breath. Tiredness (fatigue). Fever. How is  this diagnosed? This condition is diagnosed based on: Your symptoms. Your medical history. A physical exam. Tests to find out if your condition is acute or chronic. This may include: Checking your nose for nasal polyps. Viewing your sinuses using a device that has a light (endoscope). Testing for allergies or bacteria. Imaging tests, such as an MRI or CT scan. In rare cases, a bone biopsy may be done to rule out more serious types of fungal sinus disease. How is this treated? Treatment for a sinus infection depends on the cause and whether your condition is chronic or acute. If caused by a virus, your symptoms should go away on their own within 10 days. You may be given medicines to relieve symptoms. They include: Medicines that shrink swollen nasal passages (decongestants). A spray that eases inflammation of the nostrils (topical intranasal corticosteroids). Rinses that help get rid of thick mucus in your nose (nasal saline washes). Medicines that treat allergies (antihistamines). Over-the-counter pain relievers. If caused by bacteria, your health care provider may recommend waiting to see if your symptoms improve. Most bacterial infections will get better without antibiotic medicine. You may be given antibiotics if you have: A severe infection. A weak immune system. If caused by narrow nasal passages or nasal polyps, surgery may be needed. Follow these instructions at home: Medicines Take, use, or apply over-the-counter and prescription medicines only as told by your health care provider. These may include nasal sprays. If you were prescribed an antibiotic medicine, take it as told by your health care provider. Do not stop taking the antibiotic even if you start to feel better. Hydrate and humidify  Drink enough fluid to keep your urine pale yellow. Staying hydrated will help to thin your mucus. Use a cool mist humidifier to keep the humidity level in your home above 50%. Inhale  steam for 10-15 minutes, 3-4 times a day, or as told by your health care provider. You can do this in the bathroom while a hot shower is running. Limit your exposure to cool or dry air. Rest Rest as much as possible. Sleep with your head raised (elevated). Make sure you get enough sleep each night. General instructions  Apply a warm, moist washcloth to your face 3-4 times a day or as told by your health care provider. This will help with discomfort. Use nasal saline washes as often as told by your health care provider. Wash your hands often with soap and water to reduce your exposure to germs. If soap and water are not available, use hand sanitizer. Do not smoke. Avoid being around people who are smoking (secondhand smoke). Keep all follow-up visits. This is important. Contact a health care provider if: You have a fever. Your symptoms get worse. Your symptoms do not improve within 10 days. Get help right away if: You have a severe headache. You have persistent vomiting. You have severe pain or swelling around your face or eyes. You have vision problems. You develop confusion. Your neck is stiff. You have trouble breathing. These symptoms may be an emergency. Get help right away. Call 911. Do not wait to see if the symptoms will  go away. Do not drive yourself to the hospital. Summary A sinus infection is soreness and inflammation of your sinuses. Sinuses are hollow spaces in the bones around your face. This condition is caused by nasal tissues that become inflamed or swollen. The swelling traps or blocks the flow of mucus. This allows bacteria, viruses, and fungi to grow, which leads to infection. If you were prescribed an antibiotic medicine, take it as told by your health care provider. Do not stop taking the antibiotic even if you start to feel better. Keep all follow-up visits. This is important. This information is not intended to replace advice given to you by your health care  provider. Make sure you discuss any questions you have with your health care provider. Document Revised: 07/15/2021 Document Reviewed: 07/15/2021 Elsevier Patient Education  2024 Elsevier Inc.    If you have been instructed to have an in-person evaluation today at a local Urgent Care facility, please use the link below. It will take you to a list of all of our available [facility], including address, phone number and hours of operation. Please do not delay care.  [facility]  If you or a family member do not have a primary care provider, use the link below to schedule a visit and establish care. When you choose a [facility] primary care physician or advanced practice provider, you gain a long-term partner in health. Find a Primary Care Provider  Learn more about [facility]'s in-office and virtual care options: [facility] - Get Care Now

## 2023-06-11 NOTE — Progress Notes (Signed)
Virtual Visit Consent   Erin [name] [facility], you are scheduled for a virtual visit with a [facility] provider today. Just as with appointments in the office, your consent must be obtained to participate. Your consent will be active for this visit and any virtual visit you may have with one of our providers in the next 365 days. If you have a MyChart account, a copy of this consent can be sent to you electronically.  As this is a virtual visit, video technology does not allow for your provider to perform a traditional examination. This may limit your provider's ability to fully assess your condition. If your provider identifies any concerns that need to be evaluated in person or the need to arrange testing (such as labs, EKG, etc.), we will make arrangements to do so. Although advances in technology are sophisticated, we cannot ensure that it will always work on either your end or our end. If the connection with a video visit is poor, the visit may have to be switched to a telephone visit. With either a video or telephone visit, we are not always able to ensure that we have a secure connection.  By engaging in this virtual visit, you consent to the provision of healthcare and authorize for your insurance to be billed (if applicable) for the services provided during this visit. Depending on your insurance coverage, you may receive a charge related to this service.  I need to obtain your verbal consent now. Are you willing to proceed with your visit today? Erin [name] has provided verbal consent on 06/11/2023 for a virtual visit (video or telephone). Erin Loveless, PA-C  Date: 06/11/2023 9:28 AM  Virtual Visit via Video Note   I, Erin [name], connected with  Erin [name]  ([id number], Mar 16, 1981) on 06/11/23 at  9:15 AM EDT by a video-enabled telemedicine application and verified that I am speaking with the correct person using two identifiers.  Location: Patient: Virtual Visit Location  Patient: Mobile Provider: Virtual Visit Location Provider: Home Office   I discussed the limitations of evaluation and management by telemedicine and the availability of in person appointments. The patient expressed understanding and agreed to proceed.    History of Present Illness: Erin [name] is a 42 y.o. who identifies as a female who was assigned female at birth, and is being seen today for possible sinus infection.  HPI: Sinusitis This is a new problem. The current episode started in the past 7 days. The problem has been gradually worsening since onset. There has been no fever. The pain is moderate. Associated symptoms include congestion, headaches, a hoarse voice (congested voice), sinus pressure and a sore throat (initially, improving). Pertinent negatives include no chills, ear pain or swollen glands. (Rhinorrhea and post nasal drainage) Treatments tried: emergen-c, tylenol, dayquil, sudafed. The treatment provided no relief.  PMH: IIH, recently diagnosed   Problems:  Patient Active Problem List   Diagnosis Date Noted   Accelerated hypertension 04/15/2023   Postpartum care following vaginal delivery 03/27/2021   Chronic hypertension complicating or reason for care during pregnancy, third trimester 03/26/2021   HTN in pregnancy, chronic 03/26/2021    Allergies: No Known Allergies Medications:  Current Outpatient Medications:    amoxicillin-clavulanate (AUGMENTIN) 875-125 MG tablet, Take 1 tablet by mouth 2 (two) times daily., Disp: 20 tablet, Rfl: 0   acetaZOLAMIDE (DIAMOX) 125 MG tablet, Take 125 mg by mouth 2 (two) times daily., Disp: , Rfl:    Blood Pressure Monitor  MISC, Use to check blood pressure daily., Disp: 1 each, Rfl: 0   fluticasone (FLONASE) 50 MCG/ACT nasal spray, Place 2 sprays into both nostrils daily., Disp: 16 g, Rfl: 0   hydrochlorothiazide (HYDRODIURIL) 25 MG tablet, Take 1 tablet (25 mg total) by mouth daily., Disp: 30 tablet, Rfl: 2   Semaglutide-Weight  Management (WEGOVY) 0.5 MG/0.5ML SOAJ, Inject 0.5 mg into the skin once a week., Disp: 2 mL, Rfl: 0  Observations/Objective: Patient is well-developed, well-nourished in no acute distress.  Resting comfortably at home.  Head is normocephalic, atraumatic.  No labored breathing. Speech is clear and coherent with logical content.  Patient is alert and oriented at baseline.    Assessment and Plan: 1. Acute bacterial sinusitis - amoxicillin-clavulanate (AUGMENTIN) 875-125 MG tablet; Take 1 tablet by mouth 2 (two) times daily.  Dispense: 20 tablet; Refill: 0  - Worsening symptoms that have not responded to OTC medications.  - Will give Augmentin - Continue allergy medications.  - Steam and humidifier can help - Stay well hydrated and get plenty of rest.  - Seek in person evaluation if no symptom improvement or if symptoms worsen   Follow Up Instructions: I discussed the assessment and treatment plan with the patient. The patient was provided an opportunity to ask questions and all were answered. The patient agreed with the plan and demonstrated an understanding of the instructions.  A copy of instructions were sent to the patient via MyChart unless otherwise noted below.    The patient was advised to call back or seek an in-person evaluation if the symptoms worsen or if the condition fails to improve as anticipated.    Erin Loveless, PA-C

## 2023-06-16 NOTE — Progress Notes (Signed)
Initial neurology clinic note  Erin [name] MRN: [id number] DOB: [birth year]  Referring provider: Leilani Able, MD  Primary care provider: Leilani Able, MD  Reason for consult:  headaches  Subjective:  This is Erin [name], a 42 y.o. right-handed female with a medical history of HTN who presents to neurology clinic with headaches. The patient is accompanied by mother.  She was having some difficulty with blood pressure and weight after having a child in 2022. Patient has had chronic headaches for many years. In 11/2022 or 12/2022, patient started having daily headaches. She describes the headache as neck pain and band like pressure to the head. It is about 3-4/10 every day. It also causes eye pain. She has occasional blurry vision. She can have a more intense headache 6-7 times per month (7-8/10). When her headaches get more intense, she has photophobia and nausea. She denies phonophobia. She gets specks in her vision or fuzzy vision like she is looking through a screen, but it does not last long. Her headaches usually start when she wakes up. Sometimes laying down helps her headache. Laying down does not seem to make the headache worse.  She had such severe headache in 03/2023 and her BP was so high that she went to cardiology. Patient was sent to ED. Patient presented to ED on 03/30/23 for headache, lightheadedness, and chest pain. Her symptoms had started 2-3 weeks prior. She was taking Sudafed for frequent sinus infections. Her symptoms became more severe around 03/30/23. Symptoms improved in ED with headache cocktail. CT head showed an empty sella, so follow up with neuro was recommended.  She followed up with [facility] on 04/05/23 and mentioned blurry vision, intermittent nausea and vomiting. Patient had LP on 05/07/23 with opening pressure of 220 mm H2O (mildly elevated) and closing pressure of 100 mm H2O after 11 cc of CSF was collected. Patient had some improvement after LP (slightly).  Her vision did not significantly change after the LP.  Patient was started on Diamox 125 mg BID to TID, but has not felt well on it and does not seem to help much with her headaches. She takes it twice a day most days. She has change in her taste and tingling in hands, feet, and tongue on it. She is able to get through this.   She is to see [facility] for evaluation tomorrow, 06/25/2023.  Caffeine use: rare, no energy drinks Sleep: She does not sleep well. She goes at 9:30 pm and wakes up at 2 am to work out. She may go back to sleep at 4 to 5 am. She feels tired throughout the day. She is told she snores. She has been told she gasps for air at night.  Mood is okay. Not down, depressed, or anxious.  No family history of headaches.  MEDICATIONS:  Outpatient Encounter Medications as of 06/24/2023  Medication Sig   Blood Pressure Monitor MISC Use to check blood pressure daily.   hydrochlorothiazide (HYDRODIURIL) 25 MG tablet Take 1 tablet (25 mg total) by mouth daily.   methylPREDNISolone (MEDROL DOSEPAK) 4 MG TBPK tablet Take 6 tablets for 1 day, then 5 tablets for 1 day, then 4 tablets for 1 day, then 3 tablets for 1 day, then 2 tablets for 1 day, then 1 tablet for 1 day, then STOP   Semaglutide-Weight Management (WEGOVY) 1 MG/0.5ML SOAJ Inject 1 mg into the skin once a week.   topiramate (TOPAMAX) 25 MG tablet Take 1 tablet (25  mg total) by mouth 2 (two) times daily.   [DISCONTINUED] acetaZOLAMIDE (DIAMOX) 125 MG tablet Take 125 mg by mouth 2 (two) times daily.   fluticasone (FLONASE) 50 MCG/ACT nasal spray Place 2 sprays into both nostrils daily. (Patient not taking: Reported on 06/24/2023)   [DISCONTINUED] amoxicillin-clavulanate (AUGMENTIN) 875-125 MG tablet Take 1 tablet by mouth 2 (two) times daily. (Patient not taking: Reported on 06/24/2023)   [DISCONTINUED] Semaglutide-Weight Management (WEGOVY) 0.5 MG/0.5ML SOAJ Inject 0.5 mg into the skin once a week.   No facility-administered  encounter medications on file as of 06/24/2023.    PAST MEDICAL HISTORY: Past Medical History:  Diagnosis Date   Obesity    PCOS (polycystic ovarian syndrome)    PID (pelvic inflammatory disease)    Ruptured ovarian cyst    Vaginal Pap smear, abnormal     PAST SURGICAL HISTORY: Past Surgical History:  Procedure Laterality Date   DILATION AND CURETTAGE OF UTERUS     LAPAROSCOPY      ALLERGIES: No Known Allergies  FAMILY HISTORY: Family History  Problem Relation Age of Onset   Asthma Brother    Cancer Maternal Grandmother    Cancer Maternal Grandfather    Diabetes Maternal Grandfather    Stroke Paternal Grandfather     SOCIAL HISTORY: Social History   Tobacco Use   Smoking status: Former    Types: Cigarettes    Start date: 08/2021   Smokeless tobacco: Never   Tobacco comments:    Quit 1 1/2 years ago  Vaping Use   Vaping status: Never Used  Substance Use Topics   Alcohol use: Not Currently    Comment: occ   Drug use: Never   Social History   Social History Narrative   Are you right handed or left handed? right   Are you currently employed ?    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   Who lives with you? Husband and son   What type of home do you live in: 1 story or 2 story? Three story town house    Caffeine occas    Objective:  Vital Signs:  BP (!) 159/103   Pulse 88   Ht 5\' 3"  (1.6 m)   Wt 264 lb (119.7 kg)   SpO2 98%   BMI 46.77 kg/m   General: No acute distress.  Patient appears well-groomed.   Head:  Normocephalic/atraumatic Eyes:  fundi examined, disc margins clear. No obvious papilledema. Neck: supple, paraspinal tenderness, reduced range of motion Lungs: Non-labored breathing on room air   Neurological Exam: Mental status: alert and oriented, speech fluent and not dysarthric, language intact.  Cranial nerves: CN I: not tested CN II: pupils equal, round and reactive to light, visual fields  intact CN III, IV, VI:  full range of motion, no nystagmus, no ptosis CN V: facial sensation intact. CN VII: upper and lower face symmetric CN VIII: hearing intact CN IX, X: uvula midline CN XI: sternocleidomastoid and trapezius muscles intact CN XII: tongue midline  Bulk & Tone: normal, no fasciculations. Motor:  muscle strength 5/5 throughout Deep Tendon Reflexes:  2+ throughout,  toes downgoing.   Sensation:  Light touch sensation intact. Finger to nose testing:  Without dysmetria.   Heel to shin:  Without dysmetria.   Gait:  Normal station and stride.  Romberg negative.   Labs and Imaging review: Internal labs: Lab Results  Component Value Date   HGBA1C 5.3 05/19/2023  BMP (05/19/23) unremarkable  CBC (03/30/23) unremarkable  Imaging: Lumbar puncture (05/07/23): FINDINGS: Fluoroscopically guided lumbar puncture performed at L4-L5 yielding approximately 11 cc of crystal clear CSF. Opening pressure:  220 mm H2O Closing pressure:  100 mm H2O   CT head wo contrast (03/30/23): FINDINGS: Limitations: Assessment is limited due to poor signal noise ratio.   Brain: No evidence of acute infarction, hemorrhage, hydrocephalus, extra-axial collection or mass lesion/mass effect. Enlarged and partially empty sella.   Vascular: No hyperdense vessel or unexpected calcification.   Skull: Normal. Negative for fracture or focal lesion.   Sinuses/Orbits: No middle ear or mastoid effusion. Mucosal thickening bilateral maxillary sinuses. Orbits are unremarkable.   Other: None.   IMPRESSION: 1. Assessment is limited due to poor signal noise ratio. Within this limitation, no acute intracranial abnormality. 2. Mucosal thickening bilateral maxillary sinuses. 3. Enlarged and partially empty sella, which is nonspecific, but can be seen in the setting of idiopathic intracranial hypertension.  MRI brain w/wo contrast (04/15/21): FINDINGS: Brain: Cerebral volume within normal limits. No  significant cerebral white matter disease. No abnormal foci of restricted diffusion to suggest acute or subacute ischemia. Gray-white matter differentiation maintained. No encephalomalacia to suggest prior infarct or other insult. No evidence for acute or chronic intracranial hemorrhage.   No mass lesion, midline shift or mass effect. No hydrocephalus or extra-axial fluid collection. Pituitary gland suprasellar region normal. Midline structures intact and normal.   No abnormal enhancement. Incidental note made of a small DVA at the parasagittal anterior right frontal lobe.   Vascular: Major intracranial vascular flow voids are well maintained.   Skull and upper cervical spine: Craniocervical junction within normal limits. Visualized upper cervical spine unremarkable. Diffusely decreased T1 signal intensity seen throughout the visualized bone marrow, likely related to patient's history of anemia. No focal marrow replacing lesion. No scalp soft tissue abnormality.   Sinuses/Orbits: Globes and orbital soft tissues demonstrate no acute finding. Mild scattered mucosal thickening noted within the ethmoidal air cells. Paranasal sinuses are otherwise clear. No mastoid effusion. Inner ear structures grossly normal.   Other: Few mildly prominent level II lymph nodes noted within the partially visualized upper neck, measuring up to 1.3 cm on the right (series 17, image 18), of uncertain significance.   IMPRESSION: 1. Normal brain MRI. No findings to explain patient's symptoms identified. 2. Few mildly prominent cervical lymph nodes within the partially visualized upper neck, of uncertain significance, and may be reactive in nature. Correlation with physical exam recommended. Finding could be further assessed with dedicated CT of the neck for further evaluation as clinically warranted.  MRA head (04/15/21): FINDINGS: Anterior circulation: Both internal carotid arteries widely  patent to the termini without stenosis. A1 segments widely patent. Normal anterior communicating artery complex. Both anterior cerebral arteries widely patent to their distal aspects without stenosis. No M1 stenosis or occlusion. Normal MCA bifurcations. Distal MCA branches well perfused and symmetric.   Posterior circulation: Vertebral arteries are largely codominant and widely patent to the vertebrobasilar junction. Left PICA origin patent and normal. Right PICA not seen. Basilar widely patent to its distal aspect without stenosis. Superior cerebellar arteries patent bilaterally. Both PCA supplied via the basilar as well as small bilateral posterior component arteries. PCAs well perfused to their distal aspects without significant stenosis.   Anatomic variants: None significant.   Other: No intracranial aneurysm or other vascular abnormality.   IMPRESSION: Normal intracranial MRA. No aneurysm or other vascular abnormality.  MRV head (04/15/21): FINDINGS: Normal flow related signal  and enhancement seen throughout the superior sagittal sinus to the torcula. Transverse and sigmoid sinuses are patent as are the visualized proximal internal jugular veins. Straight sinus, vein of Galen, internal cerebral veins, and basal veins of Rosenthal appear patent. No abnormality about the cavernous sinus. Superior orbital veins appear symmetric and within normal limits. No evidence for dural sinus thrombosis.   Probable mild to moderate stenosis seen at the junction of the left transverse and sigmoid sinus, of uncertain significance in the absence of other morphologic features of idiopathic intracranial hypertension. No more than mild narrowing at the junction of the right transverse and sigmoid sinus.   Incidental note made of a small DVA at the parasagittal anterior right frontal lobe.   IMPRESSION: Negative intracranial MRV.  No evidence for dural sinus thrombosis.  Assessment/Plan:   Erin [name] is a 42 y.o. female who presents for evaluation of headaches. She has a relevant medical history of HTN. Her neurological examination is essentially normal today. There is no obvious papilledema by non-dilated fundoscopy. Available diagnostic data is significant for LP showing an opening pressure of 22 cm H2O. CT head this year showed a partially empty sella not noted in 2022. The etiology of patient's headaches may be due to IIH as suspected, but her opening pressure was not impressive. She also has not improved with Diamox, though her dose is likely too low. She is not tolerating Diamox well though, so higher dose would likely not be possible. Alternatively, her headaches could be migraine. Her neck pain could be contributing. Finally, she likely has undiagnosed sleep apnea which could be contributing to morning headaches.  PLAN: -Blood work: B12, TSH -Patient to see Venita Lick as planned on 06/25/23 and will have notes sent to me -Will consider repeat LP -Stop Diamox -Will start Topamax 25 mg BID -Medrol dose pack to attempt to break headache -Referral to physical therapy for cervicalgia -Sleep study for possible OSA  -Return to clinic in 3 months  The impression above as well as the plan as outlined below were extensively discussed with the patient (in the company of mother) who voiced understanding. All questions were answered to their satisfaction.  When available, results of the above investigations and possible further recommendations will be communicated to the patient via telephone/MyChart. Patient to call office if not contacted after expected testing turnaround time.   Total time spent reviewing records, interview, history/exam, documentation, and coordination of care on day of encounter:  60 min   Thank you for allowing me to participate in patient's care.  If I can answer any additional questions, I would be pleased to do so.  Jacquelyne Balint, MD   CC: Leilani Able, MD [address] [place] [address]  CC: Referring provider: Leilani Able, MD [address] [place],  [place] [address]

## 2023-06-18 ENCOUNTER — Other Ambulatory Visit (HOSPITAL_BASED_OUTPATIENT_CLINIC_OR_DEPARTMENT_OTHER): Payer: Self-pay

## 2023-06-18 MED ORDER — WEGOVY 1 MG/0.5ML ~~LOC~~ SOAJ
1.0000 mg | SUBCUTANEOUS | 0 refills | Status: DC
  Filled 2023-06-18 – 2023-06-21 (×2): qty 2, 28d supply, fill #0

## 2023-06-18 NOTE — Addendum Note (Signed)
Addended by: Cheree Ditto on: 06/18/2023 07:17 AM   Modules accepted: Orders

## 2023-06-21 ENCOUNTER — Other Ambulatory Visit (HOSPITAL_BASED_OUTPATIENT_CLINIC_OR_DEPARTMENT_OTHER): Payer: Self-pay

## 2023-06-24 ENCOUNTER — Other Ambulatory Visit (HOSPITAL_BASED_OUTPATIENT_CLINIC_OR_DEPARTMENT_OTHER): Payer: Self-pay

## 2023-06-24 ENCOUNTER — Encounter: Payer: Self-pay | Admitting: Neurology

## 2023-06-24 ENCOUNTER — Ambulatory Visit (INDEPENDENT_AMBULATORY_CARE_PROVIDER_SITE_OTHER): Payer: BC Managed Care – PPO | Admitting: Neurology

## 2023-06-24 ENCOUNTER — Other Ambulatory Visit (INDEPENDENT_AMBULATORY_CARE_PROVIDER_SITE_OTHER): Payer: BC Managed Care – PPO

## 2023-06-24 VITALS — BP 159/103 | HR 88 | Ht 63.0 in | Wt 264.0 lb

## 2023-06-24 DIAGNOSIS — R519 Headache, unspecified: Secondary | ICD-10-CM

## 2023-06-24 DIAGNOSIS — M542 Cervicalgia: Secondary | ICD-10-CM

## 2023-06-24 DIAGNOSIS — R0683 Snoring: Secondary | ICD-10-CM

## 2023-06-24 DIAGNOSIS — R110 Nausea: Secondary | ICD-10-CM

## 2023-06-24 DIAGNOSIS — G932 Benign intracranial hypertension: Secondary | ICD-10-CM

## 2023-06-24 DIAGNOSIS — H53149 Visual discomfort, unspecified: Secondary | ICD-10-CM

## 2023-06-24 DIAGNOSIS — E669 Obesity, unspecified: Secondary | ICD-10-CM

## 2023-06-24 DIAGNOSIS — G8929 Other chronic pain: Secondary | ICD-10-CM

## 2023-06-24 MED ORDER — TOPIRAMATE 25 MG PO TABS
25.0000 mg | ORAL_TABLET | Freq: Two times a day (BID) | ORAL | 3 refills | Status: DC
Start: 2023-06-24 — End: 2023-08-10
  Filled 2023-06-24: qty 60, 30d supply, fill #0

## 2023-06-24 MED ORDER — METHYLPREDNISOLONE 4 MG PO TBPK
ORAL_TABLET | ORAL | 0 refills | Status: DC
Start: 2023-06-24 — End: 2023-10-07
  Filled 2023-06-24: qty 21, 6d supply, fill #0

## 2023-06-24 NOTE — Patient Instructions (Addendum)
I saw you today for headaches. This could be IIH that you were previously told. Your opening pressure on the lumbar puncture was not impressive, but was slightly high, so this could be the cause. Other potential causes include migraine, neck pain, or sleep apnea.  I would like to do the following today: -Blood work today: B12, TSH. I will be in touch when I have the results. -See [facility] as planned on 06/25/23 and will have notes sent to me. I will be in touch when I have those results. Please send me a message if you do not hear from me in about a week. -Will consider repeat lumbar puncture if needed in the future -Will start Topamax 25 mg BID -Stop Diamox -Medrol dose pack to attempt to break headache. You will take as directed for 6 days. -Referral to physical therapy for neck pain -Sleep study for possible sleep apnea  -Return to clinic in 3 months  Please let me know if you have any questions or concerns in the meantime.  The physicians and staff at [facility] are committed to providing excellent care. You may receive a survey requesting feedback about your experience at our office. We strive to receive "very good" responses to the survey questions. If you feel that your experience would prevent you from giving the office a "very good " response, please contact our office to try to remedy the situation. We may be reached at [phone]. Thank you for taking the time out of your busy day to complete the survey.  Jacquelyne Balint, MD [facility]

## 2023-06-25 ENCOUNTER — Other Ambulatory Visit: Payer: Self-pay

## 2023-06-25 DIAGNOSIS — H31011 Macula scars of posterior pole (postinflammatory) (post-traumatic), right eye: Secondary | ICD-10-CM | POA: Diagnosis not present

## 2023-06-25 DIAGNOSIS — G932 Benign intracranial hypertension: Secondary | ICD-10-CM | POA: Diagnosis not present

## 2023-06-25 DIAGNOSIS — H5713 Ocular pain, bilateral: Secondary | ICD-10-CM | POA: Diagnosis not present

## 2023-06-25 LAB — VITAMIN B12: Vitamin B-12: 411 pg/mL (ref 211–911)

## 2023-06-25 LAB — TSH: TSH: 1.97 u[IU]/mL (ref 0.35–5.50)

## 2023-06-28 ENCOUNTER — Telehealth: Payer: Self-pay | Admitting: Neurology

## 2023-06-28 NOTE — Telephone Encounter (Signed)
Called patient to discuss evaluation by [facility]. They mentioned:    I was calling patient to discuss.  I asked for a call back to our office.  Jacquelyne Balint, MD [facility]

## 2023-07-05 ENCOUNTER — Encounter: Payer: Self-pay | Admitting: Neurology

## 2023-07-05 ENCOUNTER — Other Ambulatory Visit: Payer: Self-pay

## 2023-07-05 ENCOUNTER — Telehealth: Payer: Self-pay

## 2023-07-05 DIAGNOSIS — G932 Benign intracranial hypertension: Secondary | ICD-10-CM

## 2023-07-05 DIAGNOSIS — H53149 Visual discomfort, unspecified: Secondary | ICD-10-CM

## 2023-07-05 NOTE — Telephone Encounter (Signed)
Ordered MRI to [facility]

## 2023-07-08 DIAGNOSIS — H31011 Macula scars of posterior pole (postinflammatory) (post-traumatic), right eye: Secondary | ICD-10-CM | POA: Diagnosis not present

## 2023-07-08 DIAGNOSIS — G932 Benign intracranial hypertension: Secondary | ICD-10-CM | POA: Diagnosis not present

## 2023-07-13 ENCOUNTER — Other Ambulatory Visit (HOSPITAL_BASED_OUTPATIENT_CLINIC_OR_DEPARTMENT_OTHER): Payer: Self-pay

## 2023-07-13 MED ORDER — WEGOVY 1.7 MG/0.75ML ~~LOC~~ SOAJ
1.7000 mg | SUBCUTANEOUS | 0 refills | Status: DC
  Filled 2023-07-13: qty 3, 28d supply, fill #0

## 2023-07-13 NOTE — Addendum Note (Signed)
Addended by: Cheree Ditto on: 07/13/2023 02:31 PM   Modules accepted: Orders

## 2023-07-14 ENCOUNTER — Other Ambulatory Visit (HOSPITAL_BASED_OUTPATIENT_CLINIC_OR_DEPARTMENT_OTHER): Payer: Self-pay

## 2023-07-14 ENCOUNTER — Other Ambulatory Visit: Payer: Self-pay

## 2023-07-14 DIAGNOSIS — R0683 Snoring: Secondary | ICD-10-CM

## 2023-07-15 ENCOUNTER — Encounter: Payer: Self-pay | Admitting: Neurology

## 2023-07-27 ENCOUNTER — Ambulatory Visit
Admission: RE | Admit: 2023-07-27 | Discharge: 2023-07-27 | Disposition: A | Discharge status: Home / Self Care | Payer: BC Managed Care – PPO | Source: Ambulatory Visit | Attending: Neurology

## 2023-07-27 ENCOUNTER — Other Ambulatory Visit (HOSPITAL_BASED_OUTPATIENT_CLINIC_OR_DEPARTMENT_OTHER): Payer: Self-pay

## 2023-07-27 DIAGNOSIS — G932 Benign intracranial hypertension: Secondary | ICD-10-CM

## 2023-07-27 DIAGNOSIS — H47093 Other disorders of optic nerve, not elsewhere classified, bilateral: Secondary | ICD-10-CM | POA: Diagnosis not present

## 2023-07-27 DIAGNOSIS — H53149 Visual discomfort, unspecified: Secondary | ICD-10-CM

## 2023-07-27 DIAGNOSIS — J3489 Other specified disorders of nose and nasal sinuses: Secondary | ICD-10-CM | POA: Diagnosis not present

## 2023-07-27 MED ORDER — WEGOVY 2.4 MG/0.75ML ~~LOC~~ SOAJ
2.4000 mg | SUBCUTANEOUS | 1 refills | Status: DC
  Filled 2023-07-27: qty 3, 28d supply, fill #0
  Filled 2023-08-12: qty 9, 84d supply, fill #0
  Filled 2023-10-22 – 2023-11-10 (×2): qty 9, 84d supply, fill #1

## 2023-07-27 MED ORDER — GADOPICLENOL 0.5 MMOL/ML IV SOLN
10.0000 mL | Freq: Once | INTRAVENOUS | Status: AC | PRN
  Administered 2023-07-27: 10 mL via INTRAVENOUS

## 2023-07-27 NOTE — Addendum Note (Signed)
Addended by: Cheree Ditto on: 07/27/2023 08:51 AM   Modules accepted: Orders

## 2023-07-29 DIAGNOSIS — M6281 Muscle weakness (generalized): Secondary | ICD-10-CM | POA: Diagnosis not present

## 2023-07-29 DIAGNOSIS — G8929 Other chronic pain: Secondary | ICD-10-CM | POA: Diagnosis not present

## 2023-07-29 DIAGNOSIS — M542 Cervicalgia: Secondary | ICD-10-CM | POA: Diagnosis not present

## 2023-07-30 ENCOUNTER — Ambulatory Visit: Payer: BC Managed Care – PPO | Admitting: Cardiology

## 2023-07-30 ENCOUNTER — Other Ambulatory Visit (HOSPITAL_BASED_OUTPATIENT_CLINIC_OR_DEPARTMENT_OTHER): Payer: Self-pay

## 2023-07-30 MED ORDER — AMLODIPINE BESYLATE 5 MG PO TABS
5.0000 mg | ORAL_TABLET | Freq: Every day | ORAL | 1 refills | Status: DC
  Filled 2023-07-30: qty 90, 90d supply, fill #0
  Filled 2023-10-22: qty 90, 90d supply, fill #1

## 2023-07-30 NOTE — Addendum Note (Signed)
Addended by: Cheree Ditto on: 07/30/2023 02:49 PM   Modules accepted: Orders

## 2023-08-05 ENCOUNTER — Emergency Department (HOSPITAL_BASED_OUTPATIENT_CLINIC_OR_DEPARTMENT_OTHER)
Admission: EM | Admit: 2023-08-05 | Discharge: 2023-08-05 | Disposition: A | Discharge status: Home / Self Care | Payer: BC Managed Care – PPO | Attending: Emergency Medicine | Admitting: Emergency Medicine

## 2023-08-05 ENCOUNTER — Encounter (HOSPITAL_BASED_OUTPATIENT_CLINIC_OR_DEPARTMENT_OTHER): Payer: Self-pay | Admitting: Urology

## 2023-08-05 ENCOUNTER — Telehealth: Payer: Self-pay | Admitting: Cardiology

## 2023-08-05 ENCOUNTER — Emergency Department (HOSPITAL_BASED_OUTPATIENT_CLINIC_OR_DEPARTMENT_OTHER): Payer: BC Managed Care – PPO

## 2023-08-05 ENCOUNTER — Other Ambulatory Visit: Payer: Self-pay

## 2023-08-05 DIAGNOSIS — I10 Essential (primary) hypertension: Secondary | ICD-10-CM | POA: Insufficient documentation

## 2023-08-05 DIAGNOSIS — R0789 Other chest pain: Secondary | ICD-10-CM | POA: Diagnosis not present

## 2023-08-05 DIAGNOSIS — Z79899 Other long term (current) drug therapy: Secondary | ICD-10-CM | POA: Insufficient documentation

## 2023-08-05 DIAGNOSIS — Z87891 Personal history of nicotine dependence: Secondary | ICD-10-CM | POA: Diagnosis not present

## 2023-08-05 DIAGNOSIS — R0602 Shortness of breath: Secondary | ICD-10-CM | POA: Diagnosis not present

## 2023-08-05 DIAGNOSIS — R072 Precordial pain: Secondary | ICD-10-CM | POA: Diagnosis not present

## 2023-08-05 DIAGNOSIS — R079 Chest pain, unspecified: Secondary | ICD-10-CM | POA: Diagnosis not present

## 2023-08-05 LAB — BASIC METABOLIC PANEL
Anion gap: 9 (ref 5–15)
BUN: 12 mg/dL (ref 6–20)
CO2: 23 mmol/L (ref 22–32)
Calcium: 8.8 mg/dL — ABNORMAL LOW (ref 8.9–10.3)
Chloride: 104 mmol/L (ref 98–111)
Creatinine, Ser: 0.87 mg/dL (ref 0.44–1.00)
GFR, Estimated: >60 mL/min (ref >60)
Glucose, Bld: 107 mg/dL — ABNORMAL HIGH (ref 70–99)
Potassium: 3.3 mmol/L — ABNORMAL LOW (ref 3.5–5.1)
Sodium: 136 mmol/L (ref 135–145)

## 2023-08-05 LAB — CBC
HCT: 39.4 % (ref 36.0–46.0)
Hemoglobin: 13.5 g/dL (ref 12.0–15.0)
MCH: 29.9 pg (ref 26.0–34.0)
MCHC: 34.3 g/dL (ref 30.0–36.0)
MCV: 87.4 fL (ref 80.0–100.0)
Platelets: 277 10*3/uL (ref 150–400)
RBC: 4.51 MIL/uL (ref 3.87–5.11)
RDW: 12.9 % (ref 11.5–15.5)
WBC: 6.6 10*3/uL (ref 4.0–10.5)
nRBC: 0 % (ref 0.0–0.2)

## 2023-08-05 LAB — TROPONIN I (HIGH SENSITIVITY): Troponin I (High Sensitivity): <2 ng/L (ref <18)

## 2023-08-05 LAB — PREGNANCY, URINE: Preg Test, Ur: NEGATIVE

## 2023-08-05 LAB — D-DIMER, QUANTITATIVE: D-Dimer, Quant: <0.27 ug{FEU}/mL (ref 0.00–0.50)

## 2023-08-05 NOTE — Telephone Encounter (Signed)
Called and spoke to patient who was seen in the ED. She states she continue to have chest pressure and SOB. She was seen today in the ED and discharged to follow up in the office. Appointment scheduled with Bradd Burner for 12/24 at 9:00 am. She was hoping to be seen by Dr Servando Salina. Will send message to Dr Servando Salina for okay to add to schedule. Please advise.

## 2023-08-05 NOTE — Telephone Encounter (Signed)
  Per MyChart scheduling message:  Initial Complaint: Chest pain shortness of breath   Pt c/o of Chest Pain: STAT if active CP, including tightness, pressure, jaw pain, radiating pain to shoulder/upper arm/back, CP unrelieved by Nitro. Symptoms reported of SOB, nausea, vomiting, sweating.  1. Are you having CP right now?    2. Are you experiencing any other symptoms (ex. SOB, nausea, vomiting, sweating)?   3. Is your CP continuous or coming and going?   4. Have you taken Nitroglycerin?   5. How long have you been experiencing CP?    6. If NO CP at time of call then end call with telling Pt to call back or call 911 if Chest pain returns prior to return call from triage team.   Good morning I actually ended up at the emergency room for them to check me out and they cleared me of any clots or anything but wanted me to follow up with you all for an EKG and a non-exercise stress test. I am still currently having chest pain, but I would overall pressure or a burning feeling even sometimes I've been having the pain since Friday. It's all the time I have shortness of breath when walking but when I'm still it's almost like I can't fully expand my lungs to catch that breath if it makes any sense. It is a constant pain and no, I have never taken that medication before. Also at the ER get out my lungs they said my sound very clear.

## 2023-08-05 NOTE — ED Triage Notes (Signed)
Pt states chest pain and SOB since Friday  States cardiology told to come here, new BP med started on Monday (Amlodipine)  Pt has labored breathing with talking and exertion   Recent dx of IIH

## 2023-08-05 NOTE — Discharge Instructions (Signed)
Here today without any acute findings.  Cardiac heart marker was normal.  And screening test for blood clots in the lungs was negative.  Chest x-ray clear.  Labs without significant abnormalities.  Recommend following back up with your cardiologist.  They may want to do an echocardiogram or nonexercise stress test.

## 2023-08-05 NOTE — ED Provider Notes (Addendum)
[place] [facility] Provider Note   CSN: [id number] Arrival date & time: 08/05/23  1011     History  Chief Complaint  Patient presents with   Chest Pain    Erin [name] is a 42 y.o. female.  Patient with a complaint of left substernal chest pain sometimes radiating to the shoulder.  Associated with shortness of breath.  Has been constant more of a pressure and burning sensation.  The shortness of breath has been persistent.  Patient here seems to be having trouble talking in complete sentences due to being short of breath.  Patient followed by cardiology for hypertension.  Patient not known to have any significant coronary artery disease.  Has been told by cardiology she has some hypertensive changes to her heart.  Past medical history otherwise significant for obesity polycystic ovarian syndrome.  Patient is a former smoker quit in 2023.  Associated with the symptoms patient also felt a little lightheaded and thought maybe she may pass out.       Home Medications Prior to Admission medications   Medication Sig Start Date End Date Taking? Authorizing Provider  amLODipine (NORVASC) 5 MG tablet Take 1 tablet (5 mg total) by mouth daily. 07/30/23 11/01/23  Tobb, Lavona Mound, DO  Blood Pressure Monitor MISC Use to check blood pressure daily. 04/15/23   Tobb, Kardie, DO  fluticasone (FLONASE) 50 MCG/ACT nasal spray Place 2 sprays into both nostrils daily. Patient not taking: Reported on 06/24/2023 06/11/23   [name], PA-C  hydrochlorothiazide (HYDRODIURIL) 25 MG tablet Take 1 tablet (25 mg total) by mouth daily. 04/15/23   Tobb, Kardie, DO  methylPREDNISolone (MEDROL DOSEPAK) 4 MG TBPK tablet Take 6 tablets for 1 day, then 5 tablets for 1 day, then 4 tablets for 1 day, then 3 tablets for 1 day, then 2 tablets for 1 day, then 1 tablet for 1 day, then STOP 06/24/23   Antony Madura, MD  Semaglutide-Weight Management (WEGOVY) 2.4 MG/0.75ML SOAJ  Inject 2.4 mg into the skin once a week. 07/27/23   Tobb, Kardie, DO  topiramate (TOPAMAX) 25 MG tablet Take 1 tablet (25 mg total) by mouth 2 (two) times daily. 06/24/23   Antony Madura, MD      Allergies    Patient has no known allergies.    Review of Systems   Review of Systems  Constitutional:  Negative for chills and fever.  HENT:  Negative for ear pain and sore throat.   Eyes:  Negative for pain and visual disturbance.  Respiratory:  Positive for shortness of breath. Negative for cough.   Cardiovascular:  Positive for chest pain. Negative for palpitations.  Gastrointestinal:  Negative for abdominal pain and vomiting.  Genitourinary:  Negative for dysuria and hematuria.  Musculoskeletal:  Negative for arthralgias and back pain.  Skin:  Negative for color change and rash.  Neurological:  Positive for light-headedness. Negative for seizures and syncope.  All other systems reviewed and are negative.   Physical Exam Updated Vital Signs BP (!) 132/91 (BP Location: Right Arm)   Pulse (!) 116   Resp (!) 26   Ht 1.6 m (5\' 3" )   Wt 119.7 kg   LMP 07/20/2023   SpO2 100%   BMI 46.75 kg/m  Physical Exam Vitals and nursing note reviewed.  Constitutional:      General: She is not in acute distress.    Appearance: Normal appearance. She is well-developed.  HENT:  Head: Normocephalic and atraumatic.  Eyes:     Conjunctiva/sclera: Conjunctivae normal.  Cardiovascular:     Rate and Rhythm: Normal rate and regular rhythm.     Heart sounds: No murmur heard. Pulmonary:     Effort: Pulmonary effort is normal. No respiratory distress.     Breath sounds: Normal breath sounds. No wheezing or rhonchi.  Abdominal:     Palpations: Abdomen is soft.     Tenderness: There is no abdominal tenderness.  Musculoskeletal:        General: No swelling.     Cervical back: Neck supple. No rigidity.     Right lower leg: No edema.     Left lower leg: No edema.  Skin:    General: Skin is warm  and dry.     Capillary Refill: Capillary refill takes less than 2 seconds.  Neurological:     General: No focal deficit present.     Mental Status: She is alert and oriented to person, place, and time.  Psychiatric:        Mood and Affect: Mood normal.     ED Results / Procedures / Treatments   Labs (all labs ordered are listed, but only abnormal results are displayed) Labs Reviewed  BASIC METABOLIC PANEL - Abnormal; Notable for the following components:      Result Value   Potassium 3.3 (*)    Glucose, Bld 107 (*)    Calcium 8.8 (*)    All other components within normal limits  CBC  PREGNANCY, URINE  D-DIMER, QUANTITATIVE  TROPONIN I (HIGH SENSITIVITY)    EKG EKG Interpretation Date/Time:  Thursday August 05 2023 10:22:11 EST Ventricular Rate:  110 PR Interval:  162 QRS Duration:  78 QT Interval:  332 QTC Calculation: 450 R Axis:   16  Text Interpretation: Sinus tachycardia Low voltage, precordial leads Confirmed by Vanetta Mulders [phone]) on 08/05/2023 10:25:16 AM  Radiology DG Chest Portable 1 View Result Date: 08/05/2023 CLINICAL DATA:  chest pain EXAM: PORTABLE CHEST 1 VIEW COMPARISON:  March 30, 2023 FINDINGS: The cardiomediastinal silhouette is unchanged in contour. No pleural effusion. No pneumothorax. No acute pleuroparenchymal abnormality. IMPRESSION: No acute cardiopulmonary abnormality. Electronically Signed   By: Meda Klinefelter M.D.   On: 08/05/2023 11:00    Procedures Procedures    Medications Ordered in ED Medications - No data to display  ED Course/ Medical Decision Making/ A&P                                 Medical Decision Making Amount and/or Complexity of Data Reviewed Labs: ordered. Radiology: ordered.   Patient CBC is normal.  Patient's troponins are pending will do D-dimer as well.  Basic metabolic panel pending.  EKG other than sinus tachycardia no acute changes.  Patient's oxygen saturation is 100% on room air but she seems  to be short of breath.  Lungs are clear without any wheezing or any rales or rhonchi.  No lower extremity swelling.  No calf tenderness.  Patient is normal.  So no concerns for blood clot in the lungs.  Basic metabolic panel only significant for potassium being down a little bit at 3.3 renal functions normal.  CBC normal no leukocytosis hemoglobin 13.5 platelets 277.  Initial troponin less than 2 which do not think we need a delta troponin because the symptoms been ongoing since Monday.  Pregnancy test negative portable chest x-ray no acute  cardiopulmonary findings.  Patient is oxygen saturations currently 100%.  Despite the subjective shortness of breath no findings of hypoxia or blood clots in the lungs.  And initial troponin was normal.  Will have her follow back up with cardiology in case they want to do an echocardiogram or nonexercise stress test because she does have some risk factors.  But patient is stable for discharge home.   Consistent with atypical chest pain patient stable for discharge home.    Final Clinical Impression(s) / ED Diagnoses Final diagnoses:  Shortness of breath  Atypical chest pain    Rx / DC Orders ED Discharge Orders     None         Vanetta Mulders, MD 08/05/23 1052    Vanetta Mulders, MD 08/05/23 [phone]

## 2023-08-06 NOTE — Telephone Encounter (Signed)
Called and spoke to patient and made her aware that she can see Dr. Servando Salina after her visit schedule for 12/24 with APP. Visit scheduled with Dr. Servando Salina 1/30@9  am.

## 2023-08-09 ENCOUNTER — Emergency Department (HOSPITAL_BASED_OUTPATIENT_CLINIC_OR_DEPARTMENT_OTHER): Payer: BC Managed Care – PPO | Admitting: Radiology

## 2023-08-09 ENCOUNTER — Emergency Department (HOSPITAL_BASED_OUTPATIENT_CLINIC_OR_DEPARTMENT_OTHER)
Admission: EM | Admit: 2023-08-09 | Discharge: 2023-08-09 | Disposition: A | Discharge status: Home / Self Care | Payer: BC Managed Care – PPO | Attending: Emergency Medicine | Admitting: Emergency Medicine

## 2023-08-09 ENCOUNTER — Other Ambulatory Visit: Payer: Self-pay

## 2023-08-09 ENCOUNTER — Encounter: Payer: Self-pay | Admitting: Neurology

## 2023-08-09 DIAGNOSIS — I10 Essential (primary) hypertension: Secondary | ICD-10-CM | POA: Insufficient documentation

## 2023-08-09 DIAGNOSIS — R071 Chest pain on breathing: Secondary | ICD-10-CM | POA: Diagnosis not present

## 2023-08-09 DIAGNOSIS — R0789 Other chest pain: Secondary | ICD-10-CM | POA: Diagnosis not present

## 2023-08-09 DIAGNOSIS — R0602 Shortness of breath: Secondary | ICD-10-CM | POA: Insufficient documentation

## 2023-08-09 DIAGNOSIS — R1110 Vomiting, unspecified: Secondary | ICD-10-CM | POA: Diagnosis not present

## 2023-08-09 DIAGNOSIS — R079 Chest pain, unspecified: Secondary | ICD-10-CM | POA: Diagnosis not present

## 2023-08-09 DIAGNOSIS — Z79899 Other long term (current) drug therapy: Secondary | ICD-10-CM | POA: Diagnosis not present

## 2023-08-09 LAB — BASIC METABOLIC PANEL
Anion gap: 9 (ref 5–15)
BUN: 13 mg/dL (ref 6–20)
CO2: 23 mmol/L (ref 22–32)
Calcium: 9.4 mg/dL (ref 8.9–10.3)
Chloride: 107 mmol/L (ref 98–111)
Creatinine, Ser: 0.71 mg/dL (ref 0.44–1.00)
GFR, Estimated: >60 mL/min (ref >60)
Glucose, Bld: 100 mg/dL — ABNORMAL HIGH (ref 70–99)
Potassium: 3.4 mmol/L — ABNORMAL LOW (ref 3.5–5.1)
Sodium: 139 mmol/L (ref 135–145)

## 2023-08-09 LAB — HEPATIC FUNCTION PANEL
ALT: 11 U/L (ref 0–44)
AST: 18 U/L (ref 15–41)
Albumin: 4.3 g/dL (ref 3.5–5.0)
Alkaline Phosphatase: 49 U/L (ref 38–126)
Bilirubin, Direct: 0.1 mg/dL (ref 0.0–0.2)
Indirect Bilirubin: 0.3 mg/dL (ref 0.3–0.9)
Total Bilirubin: 0.4 mg/dL (ref <1.2)
Total Protein: 7.4 g/dL (ref 6.5–8.1)

## 2023-08-09 LAB — CBC
HCT: 40.2 % (ref 36.0–46.0)
Hemoglobin: 13.8 g/dL (ref 12.0–15.0)
MCH: 30 pg (ref 26.0–34.0)
MCHC: 34.3 g/dL (ref 30.0–36.0)
MCV: 87.4 fL (ref 80.0–100.0)
Platelets: 259 10*3/uL (ref 150–400)
RBC: 4.6 MIL/uL (ref 3.87–5.11)
RDW: 13 % (ref 11.5–15.5)
WBC: 8.8 10*3/uL (ref 4.0–10.5)
nRBC: 0 % (ref 0.0–0.2)

## 2023-08-09 LAB — TROPONIN I (HIGH SENSITIVITY): Troponin I (High Sensitivity): <2 ng/L (ref <18)

## 2023-08-09 NOTE — ED Provider Notes (Signed)
[place] [facility] AT [facility] Provider Note   CSN: [id number] Arrival date & time: 08/09/23  1707     History  Chief Complaint  Patient presents with   Chest Pain   Shortness of Breath    Erin [name] is a 42 y.o. female.   Chest Pain Associated symptoms: shortness of breath   Shortness of Breath Associated symptoms: chest pain   Patient with chest pain shortness of breath.  Has had for almost a week now.  Been seen 4 days ago for the same.  Left chest.  At times goes to left arm.  Has a history of hypertension.  Uncontrolled.  Also had some vomiting today.  Negative ER workup.  Is on Wegovy and also has intracranial hypertension that is somewhat uncontrolled.    Past Medical History:  Diagnosis Date   Obesity    PCOS (polycystic ovarian syndrome)    PID (pelvic inflammatory disease)    Ruptured ovarian cyst    Vaginal Pap smear, abnormal     Home Medications Prior to Admission medications   Medication Sig Start Date End Date Taking? Authorizing Provider  amLODipine (NORVASC) 5 MG tablet Take 1 tablet (5 mg total) by mouth daily. 07/30/23 11/01/23  Tobb, Lavona Mound, DO  Blood Pressure Monitor MISC Use to check blood pressure daily. 04/15/23   Tobb, Kardie, DO  fluticasone (FLONASE) 50 MCG/ACT nasal spray Place 2 sprays into both nostrils daily. Patient not taking: Reported on 06/24/2023 06/11/23   [name], PA-C  hydrochlorothiazide (HYDRODIURIL) 25 MG tablet Take 1 tablet (25 mg total) by mouth daily. 04/15/23   Tobb, Kardie, DO  methylPREDNISolone (MEDROL DOSEPAK) 4 MG TBPK tablet Take 6 tablets for 1 day, then 5 tablets for 1 day, then 4 tablets for 1 day, then 3 tablets for 1 day, then 2 tablets for 1 day, then 1 tablet for 1 day, then STOP 06/24/23   Antony Madura, MD  Semaglutide-Weight Management (WEGOVY) 2.4 MG/0.75ML SOAJ Inject 2.4 mg into the skin once a week. 07/27/23   Tobb, Kardie, DO  topiramate (TOPAMAX) 25 MG tablet Take 1  tablet (25 mg total) by mouth 2 (two) times daily. 06/24/23   Antony Madura, MD      Allergies    Patient has no known allergies.    Review of Systems   Review of Systems  Respiratory:  Positive for shortness of breath.   Cardiovascular:  Positive for chest pain.    Physical Exam Updated Vital Signs BP (!) 141/102   Pulse 87   Temp 98.4 F (36.9 C)   Resp 18   Ht 5\' 3"  (1.6 m)   Wt 112.9 kg   LMP 07/20/2023 (Approximate)   SpO2 100%   BMI 44.11 kg/m  Physical Exam Vitals and nursing note reviewed.  HENT:     Head: Atraumatic.  Cardiovascular:     Rate and Rhythm: Regular rhythm.  Pulmonary:     Breath sounds: No decreased breath sounds or wheezing.  Chest:     Chest wall: No tenderness.  Abdominal:     Tenderness: There is no abdominal tenderness.  Musculoskeletal:     Right lower leg: No tenderness.     Left lower leg: No tenderness.  Skin:    General: Skin is warm.     Capillary Refill: Capillary refill takes less than 2 seconds.  Neurological:     Mental Status: She is alert and oriented to person, place, and time.  ED Results / Procedures / Treatments   Labs (all labs ordered are listed, but only abnormal results are displayed) Labs Reviewed  BASIC METABOLIC PANEL - Abnormal; Notable for the following components:      Result Value   Potassium 3.4 (*)    Glucose, Bld 100 (*)    All other components within normal limits  CBC  HEPATIC FUNCTION PANEL  TROPONIN I (HIGH SENSITIVITY)    EKG None  Radiology DG Chest 2 View Result Date: 08/09/2023 CLINICAL DATA:  Chest pain short of breath EXAM: CHEST - 2 VIEW COMPARISON:  08/05/2023 FINDINGS: The heart size and mediastinal contours are within normal limits. Both lungs are clear. The visualized skeletal structures are unremarkable. IMPRESSION: No active cardiopulmonary disease. Electronically Signed   By: Jasmine Pang M.D.   On: 08/09/2023 19:50    Procedures Procedures    Medications Ordered  in ED Medications - No data to display  ED Course/ Medical Decision Making/ A&P                                 Medical Decision Making Amount and/or Complexity of Data Reviewed Labs: ordered. Radiology: ordered.   Patient with chest pain and shortness of breath.  Has been for the last week.  Recent workup reassuring.  Does have some hypertension.  However EKG chest x-ray troponin and D-dimer previously negative.  Differential diagnosis does include cause of chest pain such as coronary artery disease, pulmonary embolism and pneumonia however with previous workup these are less likely.  Chest x-ray reassuring.  Blood work reassuring here.  Negative troponin.  Has had pains long of now would expect noted positive.  Can follow-up with PCP.  Some hypertension that can also be followed.  Will discharge.          Final Clinical Impression(s) / ED Diagnoses Final diagnoses:  Nonspecific chest pain    Rx / DC Orders ED Discharge Orders     None         Benjiman Core, MD 08/09/23 2000

## 2023-08-09 NOTE — ED Notes (Signed)
ED Provider at bedside. 

## 2023-08-09 NOTE — ED Triage Notes (Signed)
Pt POV from home reporting L side CP and SOB for past week, seen in ED on 12/12 for same, reassuring workup, told to follow up with cardiology and return if sx worsened, unable to get appt with cardiology soon enough. Also returning multiple episodes of emesis today.

## 2023-08-10 ENCOUNTER — Other Ambulatory Visit: Payer: Self-pay | Admitting: Neurology

## 2023-08-10 DIAGNOSIS — G932 Benign intracranial hypertension: Secondary | ICD-10-CM

## 2023-08-10 MED ORDER — TOPIRAMATE 25 MG PO TABS
50.0000 mg | ORAL_TABLET | Freq: Two times a day (BID) | ORAL | 5 refills | Status: DC
  Filled 2023-10-06: qty 120, 30d supply, fill #0

## 2023-08-12 ENCOUNTER — Other Ambulatory Visit (HOSPITAL_BASED_OUTPATIENT_CLINIC_OR_DEPARTMENT_OTHER): Payer: Self-pay

## 2023-08-15 NOTE — Progress Notes (Unsigned)
Cardiology Office Note    Date:  08/17/2023  ID:  Karin, [place] July 26, 1981, MRN [id number] PCP:  Leilani Able, MD  Cardiologist:  Thomasene Ripple, DO  Electrophysiologist:  None   Chief Complaint: ED follow up for chest pain   History of Present Illness: Marland Kitchen    WINNI KAMSTRA is a 42 y.o. female with visit-pertinent history of hypertension, PCOS, intracranial hypertension and obesity who is followed by Dr. Servando Salina presents today for further evaluation of hypertension.  Patient has history of hypertension starting around 2020 when she was trying to get pregnant through IVF.  With improvement in weight her blood pressure also improved.  After giving birth to her son in 03/2021 her blood pressure spiked and she had multiple ED visits for this.  She is referred to Dr. [facility] in 05/2022 as she is planning to get pregnant again.  Her blood pressure was 170/110 at that time, she was started on nifedipine and her labetolol was increased.  Her echo in 06/2022 showed LVEF of 60 to 65%, no RWMA, mild concentric LVH, diastolic parameters were indeterminate.  There was mild mitral valve regurgitation, borderline dilation of the ascending aorta measuring 37 mm.  She was last seen in clinic on 04/05/2023 by Dr. Servando Salina.  Her blood pressure remained elevated, labetalol was increased to 400 mg every 8 hours, nifedipine increased to 60 mg daily.  She is seen by Pharm.D. for hypertension on 05/14/2023, her pregnancy plans had reportedly changed and she had been started on hydrochlorothiazide 25 mg once daily.  On 08/05/2023 she presented to the emergency department with complaints of left substernal chest pain sometimes radiating to the shoulder and associated with shortness of breath.  Patient's high-sensitivity troponin and D-dimer were both negative.  Chest x-ray with no acute cardiopulmonary abnormalities.  EKG without any acute ischemic changes, she was discharged in stable condition with  recommendation for cardiology follow-up. On 08/09/2023 she presented back to the emergency department for chest pain and shortness of breath that have been ongoing for almost a week.  Her blood pressure was elevated at 141/102.  Patient's high sensitive troponin was negative and EKG was without ischemic changes. It was recommended she follow-up with cardiology.  Today she presents for follow up. She reports that she is doing better. Her shortness of breath and chest pressure has improved. She does note some shortness of breath when climbing the stairs in her house followed by some mild chest discomfort. She denies change in discomfort with position changes or with movement. She notes her prior chest pressure that sent her to the emergency room was constant even at rest and not associated with exertion, she had associated shortness of breath and dizziness.  Reviewed her emergency room workups, which were overall reassuring.  She reports that she started on amlodipine 5 mg daily this past Friday, she was unsure if she was to continue to stop her hydrochlorothiazide and discontinued. She notes improvement in chest pressure and breathing since starting on amlodipine.   ROS: .   Today she denies  lower extremity edema, fatigue, palpitations, melena, hematuria, hemoptysis, diaphoresis, weakness, syncope, orthopnea, and PND.  All other systems are reviewed and otherwise negative. Studies Reviewed: Marland Kitchen    EKG:  EKG is not ordered today.  EKG reviewed from 08/12/2023, this indicates normal sinus rhythm at 90 bpm, no significant change from prior EKGs.  CV Studies:  Cardiac Studies & Procedures      ECHOCARDIOGRAM  ECHOCARDIOGRAM  COMPLETE 06/30/2022  Narrative ECHOCARDIOGRAM REPORT    Patient Name:   KASHINA NASH [facility] Date of Exam: 06/30/2022 Medical Rec #:  [id number]     Height:       63.0 in Accession #:    [id number]    Weight:       272.6 lb Date of Birth:  [birth year]     BSA:          2.206 m Patient  Age:    41 years      BP:           170/110 mmHg Patient Gender: F             HR:           72 bpm. Exam Location:  [address]  Procedure: 2D Echo, Cardiac Doppler and Color Doppler  Indications:    I10 Hypertension  History:        Patient has no prior history of Echocardiogram examinations. Risk Factors:Hypertension, Morbid obesity and Former Smoker.  Sonographer:    Samule Ohm RDCS Referring Phys: [id number] KARDIE TOBB  IMPRESSIONS   1. Left ventricular ejection fraction, by estimation, is 60 to 65%. The left ventricle has normal function. The left ventricle has no regional wall motion abnormalities. There is mild concentric left ventricular hypertrophy. Left ventricular diastolic parameters are indeterminate. 2. Right ventricular systolic function is normal. The right ventricular size is normal. 3. The mitral valve is normal in structure. Mild mitral valve regurgitation. No evidence of mitral stenosis. 4. The aortic valve is normal in structure. Aortic valve regurgitation is not visualized. No aortic stenosis is present. 5. There is borderline dilatation of the ascending aorta, measuring 37 mm. 6. The inferior vena cava is normal in size with greater than 50% respiratory variability, suggesting right atrial pressure of 3 mmHg.  FINDINGS Left Ventricle: Left ventricular ejection fraction, by estimation, is 60 to 65%. The left ventricle has normal function. The left ventricle has no regional wall motion abnormalities. The left ventricular internal cavity size was normal in size. There is mild concentric left ventricular hypertrophy. Left ventricular diastolic parameters are indeterminate.  Right Ventricle: The right ventricular size is normal. No increase in right ventricular wall thickness. Right ventricular systolic function is normal.  Left Atrium: Left atrial size was normal in size.  Right Atrium: Right atrial size was normal in size.  Pericardium: There is no  evidence of pericardial effusion.  Mitral Valve: The mitral valve is normal in structure. Mild mitral valve regurgitation. No evidence of mitral valve stenosis.  Tricuspid Valve: The tricuspid valve is normal in structure. Tricuspid valve regurgitation is mild . No evidence of tricuspid stenosis.  Aortic Valve: The aortic valve is normal in structure. Aortic valve regurgitation is not visualized. Aortic regurgitation PHT measures 447 msec. No aortic stenosis is present.  Pulmonic Valve: The pulmonic valve was not well visualized. Pulmonic valve regurgitation is not visualized. No evidence of pulmonic stenosis.  Aorta: There is borderline dilatation of the ascending aorta, measuring 37 mm.  Venous: The inferior vena cava is normal in size with greater than 50% respiratory variability, suggesting right atrial pressure of 3 mmHg.  IAS/Shunts: No atrial level shunt detected by color flow Doppler.   LEFT VENTRICLE PLAX 2D LVIDd:         4.40 cm   Diastology LVIDs:         3.10 cm   LV e' medial:    6.31 cm/s LV PW:  1.20 cm   LV E/e' medial:  13.4 LV IVS:        1.60 cm   LV e' lateral:   8.27 cm/s LVOT diam:     1.80 cm   LV E/e' lateral: 10.2 LV SV:         53 LV SV Index:   24 LVOT Area:     2.54 cm   RIGHT VENTRICLE RV S prime:     12.00 cm/s TAPSE (M-mode): 1.3 cm  LEFT ATRIUM             Index        RIGHT ATRIUM           Index LA diam:        4.30 cm 1.95 cm/m   RA Pressure: 3.00 mmHg LA Vol (A2C):   47.3 ml 21.44 ml/m  RA Area:     14.70 cm LA Vol (A4C):   54.1 ml 24.53 ml/m  RA Volume:   37.40 ml  16.96 ml/m LA Biplane Vol: 54.5 ml 24.71 ml/m AORTIC VALVE LVOT Vmax:   103.00 cm/s LVOT Vmean:  69.200 cm/s LVOT VTI:    0.210 m AI PHT:      447 msec  AORTA Ao Root diam: 3.20 cm Ao Asc diam:  3.70 cm  MITRAL VALVE               TRICUSPID VALVE MV Area (PHT): 3.17 cm    Estimated RAP:  3.00 mmHg MV Decel Time: 239 msec MV E velocity: 84.40 cm/s   SHUNTS MV A velocity: 79.10 cm/s  Systemic VTI:  0.21 m MV E/A ratio:  1.07        Systemic Diam: 1.80 cm  Kardie Tobb DO Electronically signed by Thomasene Ripple DO Signature Date/Time: 06/30/2022/3:57:01 PM    Final             Current Reported Medications:.    Current Meds  Medication Sig   amLODipine (NORVASC) 5 MG tablet Take 1 tablet (5 mg total) by mouth daily.   Blood Pressure Monitor MISC Use to check blood pressure daily.   fluticasone (FLONASE) 50 MCG/ACT nasal spray Place 2 sprays into both nostrils daily.   methylPREDNISolone (MEDROL DOSEPAK) 4 MG TBPK tablet Take 6 tablets for 1 day, then 5 tablets for 1 day, then 4 tablets for 1 day, then 3 tablets for 1 day, then 2 tablets for 1 day, then 1 tablet for 1 day, then STOP   metoprolol tartrate (LOPRESSOR) 100 MG tablet Take 2 hours prior to CT scan   Semaglutide-Weight Management (WEGOVY) 2.4 MG/0.75ML SOAJ Inject 2.4 mg into the skin once a week.   topiramate (TOPAMAX) 25 MG tablet Take 2 tablets (50 mg total) by mouth 2 (two) times daily.   Physical Exam:    VS:  BP (!) 138/96   Pulse 86   Ht 5\' 3"  (1.6 m)   Wt 252 lb 3.2 oz (114.4 kg)   LMP 07/20/2023 (Approximate)   SpO2 95%   BMI 44.68 kg/m    Wt Readings from Last 3 Encounters:  08/17/23 252 lb 3.2 oz (114.4 kg)  08/09/23 249 lb (112.9 kg)  08/05/23 263 lb 14.3 oz (119.7 kg)    GEN: Well nourished, well developed in no acute distress NECK: No JVD; No carotid bruits CARDIAC: RRR, no murmurs, rubs, gallops RESPIRATORY:  Clear to auscultation without rales, wheezing or rhonchi  ABDOMEN: Soft, non-tender, non-distended EXTREMITIES:  No edema;  No acute deformity   Asessement and Plan:.    Precordial pain: Patient has presented to the emergency department twice this month for constant left-sided chest pain and shortness of breath with associated dizziness.  Workup overall reassuring with no ischemic changes on EKG and negative troponins.  D-dimer also negative.  Today she reports improvement in chest pressure and shortness of breath.  She does note she continues to have some intermittent shortness of breath when climbing the stairs in her house, she then develops some left-sided chest pressure.  She also reports 1 episode last week before improvement in which she had chest pressure and associated left-sided arm discomfort with tingling in her left hand. Discussed stress testing vs coronary cta, she would prefer to proceed with coronary CTA.  Patient reports she has no plans to become pregnant at this time. She will take metoprolol tartrate 100 mg prior to CTA. Check BMET and HCG.   Hypertension: Initial blood pressure today 140/100 on recheck 138/96.  She was started on amlodipine 5 mg daily this past Friday with improvement in her above-noted symptoms.  She was unaware if she was to continue hydrochlorothiazide, she stopped use on Sunday.  Encouraged her to restart and to monitor her blood pressure at home. Continue hydrochlorothiazide 25 mg daily and amlodipine 5 mg daily.  OSA: She is planned to have a sleep study with her neurologist.     Disposition: F/u with Dr. Servando Salina on 09/23/23 as scheduled.   Signed, [name], NP

## 2023-08-17 ENCOUNTER — Ambulatory Visit: Payer: BC Managed Care – PPO | Attending: Cardiology | Admitting: Cardiology

## 2023-08-17 ENCOUNTER — Encounter: Payer: Self-pay | Admitting: Cardiology

## 2023-08-17 ENCOUNTER — Other Ambulatory Visit (HOSPITAL_BASED_OUTPATIENT_CLINIC_OR_DEPARTMENT_OTHER): Payer: Self-pay

## 2023-08-17 VITALS — BP 138/96 | HR 86 | Ht 63.0 in | Wt 252.2 lb

## 2023-08-17 DIAGNOSIS — R0683 Snoring: Secondary | ICD-10-CM | POA: Diagnosis not present

## 2023-08-17 DIAGNOSIS — R072 Precordial pain: Secondary | ICD-10-CM

## 2023-08-17 DIAGNOSIS — I10 Essential (primary) hypertension: Secondary | ICD-10-CM | POA: Diagnosis not present

## 2023-08-17 LAB — BASIC METABOLIC PANEL
BUN/Creatinine Ratio: 13 (ref 9–23)
BUN: 11 mg/dL (ref 6–24)
CO2: 26 mmol/L (ref 20–29)
Calcium: 9.1 mg/dL (ref 8.7–10.2)
Chloride: 106 mmol/L (ref 96–106)
Creatinine, Ser: 0.86 mg/dL (ref 0.57–1.00)
Glucose: 89 mg/dL (ref 70–99)
Potassium: 4.3 mmol/L (ref 3.5–5.2)
Sodium: 144 mmol/L (ref 134–144)
eGFR: 88 mL/min/{1.73_m2} (ref >59)

## 2023-08-17 LAB — HCG, SERUM, QUALITATIVE: hCG,Beta Subunit,Qual,Serum: NEGATIVE m[IU]/mL (ref <6)

## 2023-08-17 MED ORDER — METOPROLOL TARTRATE 100 MG PO TABS
ORAL_TABLET | ORAL | 0 refills | Status: DC
  Filled 2023-08-17: qty 1, 1d supply, fill #0

## 2023-08-17 NOTE — Patient Instructions (Signed)
Medication Instructions:  Take 1 tablet of metoprolol tartrate 100 mg 2 hours prior to scan *If you need a refill on your cardiac medications before your next appointment, please call your pharmacy*   Lab Work: Today we will draw Bmet and HCG If you have labs (blood work) drawn today and your tests are completely normal, you will receive your results only by: MyChart Message (if you have MyChart) OR A paper copy in the mail If you have any lab test that is abnormal or we need to change your treatment, we will call you to review the results.   Testing/Procedures:   Your cardiac CT will be scheduled at one of the below locations:   [facility] [address] [place], [place] [address] [phone]  If scheduled at [facility], please arrive at the [facility] and Children's Entrance (Entrance C2) of [facility] 30 minutes prior to test start time. You can use the FREE valet parking offered at entrance C (encouraged to control the heart rate for the test)  Proceed to the [facility] Radiology Department (first floor) to check-in and test prep.  All radiology patients and guests should use entrance C2 at [facility], accessed from [facility], even though the hospital's physical address listed is [address]     Please follow these instructions carefully (unless otherwise directed):  An IV will be required for this test and Nitroglycerin will be given.  Hold all erectile dysfunction medications at least 3 days (72 hrs) prior to test. (Ie viagra, cialis, sildenafil, tadalafil, etc)   On the Night Before the Test: Be sure to Drink plenty of water. Do not consume any caffeinated/decaffeinated beverages or chocolate 12 hours prior to your test. Do not take any antihistamines 12 hours prior to your test. If the patient has contrast allergy:  On the Day of the Test: Drink plenty of water until 1 hour prior to the test. Do not  eat any food 1 hour prior to test. You may take your regular medications prior to the test.  Take metoprolol (Lopressor) two hours prior to test. If you take Furosemide/Hydrochlorothiazide/Spironolactone/Chlorthalidone, please HOLD on the morning of the test. Patients who wear a continuous glucose monitor MUST remove the device prior to scanning. FEMALES- please wear underwire-free bra if available, avoid dresses & tight clothing      After the Test: Drink plenty of water. After receiving IV contrast, you may experience a mild flushed feeling. This is normal. On occasion, you may experience a mild rash up to 24 hours after the test. This is not dangerous. If this occurs, you can take Benadryl 25 mg and increase your fluid intake. If you experience trouble breathing, this can be serious. If it is severe call 911 IMMEDIATELY. If it is mild, please call our office.  We will call to schedule your test 2-4 weeks out understanding that some insurance companies will need an authorization prior to the service being performed.   For more information and frequently asked questions, please visit our website : [email or web address]  For non-scheduling related questions, please contact the cardiac imaging nurse navigator should you have any questions/concerns: Cardiac Imaging Nurse Navigators Direct Office Dial: [phone]   For scheduling needs, including cancellations and rescheduling, please call [place], [phone].   Follow-Up: At [facility] you and your health needs are our priority.  As part of our continuing mission to provide you with exceptional heart care, we have created  designated Provider Care Teams.  These Care Teams include your primary Cardiologist (physician) and Advanced Practice Providers (APPs -  Physician Assistants and Nurse Practitioners) who all work together to provide you with the care you need, when you need it.  We recommend signing up for the  patient portal called "MyChart".  Sign up information is provided on this After Visit Summary.  MyChart is used to connect with patients for Virtual Visits (Telemedicine).  Patients are able to view lab/test results, encounter notes, upcoming appointments, etc.  Non-urgent messages can be sent to your provider as well.   To learn more about what you can do with MyChart, go to [email or web address].    Your next appointment:   Already Scheduled

## 2023-08-19 ENCOUNTER — Telehealth: Payer: Self-pay

## 2023-08-19 ENCOUNTER — Other Ambulatory Visit (HOSPITAL_BASED_OUTPATIENT_CLINIC_OR_DEPARTMENT_OTHER): Payer: Self-pay

## 2023-08-19 NOTE — Telephone Encounter (Signed)
-----   Message from [name] sent at 08/19/2023  1:07 PM EST ----- Please let Erin [name] know that her kidney function and electrolytes are normal, her pregnancy test was negative. She can proceed with testing a discussed.

## 2023-08-19 NOTE — Telephone Encounter (Signed)
Left message to call back  

## 2023-08-23 NOTE — Telephone Encounter (Signed)
Left message to call back  

## 2023-08-30 NOTE — Telephone Encounter (Signed)
 Tried to leave message for patient unable to.

## 2023-09-01 NOTE — Telephone Encounter (Signed)
 Left message to call back

## 2023-09-14 ENCOUNTER — Other Ambulatory Visit (HOSPITAL_BASED_OUTPATIENT_CLINIC_OR_DEPARTMENT_OTHER): Payer: Self-pay

## 2023-09-14 MED ORDER — HYDROCHLOROTHIAZIDE 25 MG PO TABS
25.0000 mg | ORAL_TABLET | Freq: Every day | ORAL | 3 refills | Status: DC
  Filled 2023-09-14: qty 90, 90d supply, fill #0
  Filled 2024-02-22 – 2024-03-20 (×2): qty 90, 90d supply, fill #1
  Filled 2024-07-12: qty 90, 90d supply, fill #2

## 2023-09-14 NOTE — Addendum Note (Signed)
Addended by: Cheree Ditto on: 09/14/2023 11:34 AM   Modules accepted: Orders

## 2023-09-16 ENCOUNTER — Other Ambulatory Visit (HOSPITAL_BASED_OUTPATIENT_CLINIC_OR_DEPARTMENT_OTHER): Payer: Self-pay

## 2023-09-23 ENCOUNTER — Ambulatory Visit: Payer: BC Managed Care – PPO | Admitting: Cardiology

## 2023-09-24 ENCOUNTER — Ambulatory Visit (HOSPITAL_BASED_OUTPATIENT_CLINIC_OR_DEPARTMENT_OTHER): Payer: BC Managed Care – PPO | Admitting: Internal Medicine

## 2023-09-29 NOTE — Progress Notes (Addendum)
 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 Discuss with patient their interest in participating in the free health coaching program and refer to REF 2201/Care Navigation.  Greig Ruth [phone] Isadore [email or web address] Care GuideHealth Coach

## 2023-10-06 ENCOUNTER — Other Ambulatory Visit (HOSPITAL_BASED_OUTPATIENT_CLINIC_OR_DEPARTMENT_OTHER): Payer: Self-pay

## 2023-10-06 ENCOUNTER — Encounter: Payer: Self-pay | Admitting: Neurology

## 2023-10-07 ENCOUNTER — Telehealth: Payer: Self-pay | Admitting: Neurology

## 2023-10-07 ENCOUNTER — Other Ambulatory Visit (HOSPITAL_BASED_OUTPATIENT_CLINIC_OR_DEPARTMENT_OTHER): Payer: Self-pay

## 2023-10-07 DIAGNOSIS — G932 Benign intracranial hypertension: Secondary | ICD-10-CM

## 2023-10-07 DIAGNOSIS — R519 Headache, unspecified: Secondary | ICD-10-CM

## 2023-10-07 MED ORDER — METHYLPREDNISOLONE 4 MG PO TBPK
ORAL_TABLET | ORAL | 0 refills | Status: DC
Start: 2023-10-07 — End: 2024-03-28
  Filled 2023-10-07: qty 21, 6d supply, fill #0

## 2023-10-07 MED ORDER — TOPIRAMATE 25 MG PO TABS
75.0000 mg | ORAL_TABLET | Freq: Two times a day (BID) | ORAL | 5 refills | Status: DC
  Filled 2023-10-07 – 2024-07-12 (×2): qty 180, 30d supply, fill #0
  Filled 2024-09-19: qty 180, 30d supply, fill #1

## 2023-10-07 NOTE — Telephone Encounter (Signed)
Called patient to discuss headaches. She is still having daily headaches. She is taking Topamax 50 mg BID. She has had current headache for 1 week straight. She tried taking Excedrin migraine, but it did not help. She denies vision changes.  I will order a Medrol dose pak as this helped previously break a bad headache cycle. I will also increase her Topamax: Topamax 50 mg in am and 75 mg for 1 week, then 75 mg BID.  She will follow up as planned later this month. All questions were answered.  Jacquelyne Balint, MD [facility] Neurology

## 2023-10-14 NOTE — Progress Notes (Signed)
 NEUROLOGY FOLLOW UP OFFICE NOTE  Erin [name] [id number]  Subjective:  Erin [name] is a 43 y.o. year old right-handed female with a medical history of HTN who we last saw on 06/24/23 for headaches.  To briefly review: 06/24/23: She was having some difficulty with blood pressure and weight after having a child in 2022. Patient has had chronic headaches for many years. In 11/2022 or 12/2022, patient started having daily headaches. She describes the headache as neck pain and band like pressure to the head. It is about 3-4/10 every day. It also causes eye pain. She has occasional blurry vision. She can have a more intense headache 6-7 times per month (7-8/10). When her headaches get more intense, she has photophobia and nausea. She denies phonophobia. She gets specks in her vision or fuzzy vision like she is looking through a screen, but it does not last long. Her headaches usually start when she wakes up. Sometimes laying down helps her headache. Laying down does not seem to make the headache worse.   She had such severe headache in 03/2023 and her BP was so high that she went to cardiology. Patient was sent to ED. Patient presented to ED on 03/30/23 for headache, lightheadedness, and chest pain. Her symptoms had started 2-3 weeks prior. She was taking Sudafed for frequent sinus infections. Her symptoms became more severe around 03/30/23. Symptoms improved in ED with headache cocktail. CT head showed an empty sella, so follow up with neuro was recommended.   She followed up with [facility] on 04/05/23 and mentioned blurry vision, intermittent nausea and vomiting. Patient had LP on 05/07/23 with opening pressure of 220 mm H2O (mildly elevated) and closing pressure of 100 mm H2O after 11 cc of CSF was collected. Patient had some improvement after LP (slightly). Her vision did not significantly change after the LP.  Patient was started on Diamox 125 mg BID to TID, but has not felt well on it and does not seem  to help much with her headaches. She takes it twice a day most days. She has change in her taste and tingling in hands, feet, and tongue on it. She is able to get through this.    She is to see [facility] for evaluation tomorrow, 06/25/2023.   Caffeine use: rare, no energy drinks Sleep: She does not sleep well. She goes at 9:30 pm and wakes up at 2 am to work out. She may go back to sleep at 4 to 5 am. She feels tired throughout the day. She is told she snores. She has been told she gasps for air at night.   Mood is okay. Not down, depressed, or anxious.   No family history of headaches.  Most recent Assessment and Plan (06/24/23): Erin [name] is a 43 y.o. female who presents for evaluation of headaches. She has a relevant medical history of HTN. Her neurological examination is essentially normal today. There is no obvious papilledema by non-dilated fundoscopy. Available diagnostic data is significant for LP showing an opening pressure of 22 cm H2O. CT head this year showed a partially empty sella not noted in 2022. The etiology of patient's headaches may be due to IIH as suspected, but her opening pressure was not impressive. She also has not improved with Diamox, though her dose is likely too low. She is not tolerating Diamox well though, so higher dose would likely not be possible. Alternatively, her headaches could be migraine. Her neck pain could be  contributing. Finally, she likely has undiagnosed sleep apnea which could be contributing to morning headaches.   PLAN: -Blood work: B12, TSH -Patient to see Venita Lick as planned on 06/25/23 and will have notes sent to me -Will consider repeat LP -Stop Diamox -Will start Topamax 25 mg BID -Medrol dose pack to attempt to break headache -Referral to physical therapy for cervicalgia -Sleep study for possible OSA  Since their last visit: Patient was seen by Venita Lick who recommended MRI of orbits due to changes seen in optic nerve. MRI orbits  on 07/27/23 showed no evidence of optic neuritis but did show prominent optic nerve sheath fluid as may be seen in IIH. Patient continued to have occasional worsening headaches, so I increased her topamax to 50 mg BID on 08/10/23. Patient had a severe headache she could not break on 10/06/23. I called and recommended a medrol dose pak to break headache and to increase topamax to 75 mg BID (10/07/23). The steroid dose pak did break the headache. She has not yet increased her topamax because she had a lot going on and forgot.  She still has blurry vision. She denies vision loss.  She has a mild headache today. She has had some increase in blood pressure and wonders if that could be contributing.  She still has not gotten her sleep study. This is scheduled 11/19/23.  She went to PT with improved of her neck pain.  MEDICATIONS:  Outpatient Encounter Medications as of 10/22/2023  Medication Sig   amLODipine (NORVASC) 5 MG tablet Take 1 tablet (5 mg total) by mouth daily.   Blood Pressure Monitor MISC Use to check blood pressure daily.   hydrochlorothiazide (HYDRODIURIL) 25 MG tablet Take 1 tablet (25 mg total) by mouth daily.   metoprolol tartrate (LOPRESSOR) 100 MG tablet Take 2 hours prior to CT scan   Semaglutide-Weight Management (WEGOVY) 2.4 MG/0.75ML SOAJ Inject 2.4 mg into the skin once a week.   topiramate (TOPAMAX) 25 MG tablet Take 3 tablets (75 mg total) by mouth 2 (two) times daily.   fluticasone (FLONASE) 50 MCG/ACT nasal spray Place 2 sprays into both nostrils daily. (Patient not taking: Reported on 10/22/2023)   methylPREDNISolone (MEDROL DOSEPAK) 4 MG TBPK tablet Take 6 tablets for 1 day, then 5 tablets for 1 day, then 4 tablets for 1 day, then 3 tablets for 1 day, then 2 tablets for 1 day, then 1 tablet for 1 day, then STOP (Patient not taking: Reported on 10/22/2023)   No facility-administered encounter medications on file as of 10/22/2023.    PAST MEDICAL HISTORY: Past Medical  History:  Diagnosis Date   Obesity    PCOS (polycystic ovarian syndrome)    PID (pelvic inflammatory disease)    Ruptured ovarian cyst    Vaginal Pap smear, abnormal     PAST SURGICAL HISTORY: Past Surgical History:  Procedure Laterality Date   DILATION AND CURETTAGE OF UTERUS     LAPAROSCOPY      ALLERGIES: No Known Allergies  FAMILY HISTORY: Family History  Problem Relation Age of Onset   Asthma Brother    Cancer Maternal Grandmother    Cancer Maternal Grandfather    Diabetes Maternal Grandfather    Stroke Paternal Grandfather     SOCIAL HISTORY: Social History   Tobacco Use   Smoking status: Former    Types: Cigarettes    Start date: 08/2021   Smokeless tobacco: Never   Tobacco comments:    Quit 1 1/2 years ago  Vaping Use   Vaping status: Never Used  Substance Use Topics   Alcohol use: Not Currently    Comment: occ   Drug use: Never   Social History   Social History Narrative   Are you right handed or left handed? right   Are you currently employed ?    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   Who lives with you? Husband and son   What type of home do you live in: 1 story or 2 story? Three story town house    Caffeine occas      Objective:  Vital Signs:  BP (!) 148/100 Comment: working with cardiologist on B/P new med. started today  Pulse 76   Ht 5\' 3"  (1.6 m)   Wt 235 lb (106.6 kg)   SpO2 100%   BMI 41.63 kg/m   General: No acute distress.  Patient appears well-groomed.   Head:  Normocephalic/atraumatic Eyes:  fundi examined, disc margins clear, no obvious papilledema Neck: supple Lungs: Non-labored breathing on room air   Neurological Exam: Mental status: alert and oriented, speech fluent and not dysarthric, language intact.  Cranial nerves: CN I: not tested CN II: pupils equal, round and reactive to light, visual fields intact CN III, IV, VI:  full range of motion, no nystagmus, no ptosis CN V:  facial sensation intact. CN VII: upper and lower face symmetric CN VIII: hearing intact CN IX, X: uvula midline CN XI: sternocleidomastoid and trapezius muscles intact CN XII: tongue midline  Bulk & Tone: normal, no fasciculations. Motor:  muscle strength 5/5 throughout Deep Tendon Reflexes:  2+ throughout.   Sensation:  Light touch sensation intact. Finger to nose testing:  Without dysmetria.   Gait:  Normal station and stride.    Labs and Imaging review: New results: 08/17/23: BMP unremarkable Hepatic function panel unremarkable  08/09/23: CBC unremarkable  06/24/23: B12: 411 TSH wnl  MRI orbits w/wo contrast (07/27/23): FINDINGS: Orbits: No orbital mass or evidence of inflammation. Normal appearance of the globes, optic nerve, extraocular muscles, orbital fat and lacrimal glands. Copious CSF signal in the optic sheaths. No posterior scleral flattening or optic mounding.   Visualized sinuses: Clear. Suspect left anterior nasal cavity polyp measuring 11 mm.   Soft tissues: No mass or inflammation is seen.   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   IMPRESSION: 1. Negative for optic neuritis or other inflammatory finding. 2. Prominent optic nerve sheath fluid, are there risk factors for pseudotumor? 3. Suspect 11 mm polyp in the left nasal cavity  Previously reviewed results: Lab Results  Component Value Date    HGBA1C 5.3 05/19/2023      BMP (05/19/23) unremarkable   CBC (03/30/23) unremarkable   Lumbar puncture (05/07/23): FINDINGS: Fluoroscopically guided lumbar puncture performed at L4-L5 yielding approximately 11 cc of crystal clear CSF. Opening pressure:  220 mm H2O Closing pressure:  100 mm H2O    CT head wo contrast (03/30/23): FINDINGS: Limitations: Assessment is limited due to poor signal noise ratio.   Brain: No evidence of acute infarction, hemorrhage,  hydrocephalus, extra-axial collection or mass lesion/mass effect. Enlarged and partially empty sella.   Vascular: No hyperdense vessel or unexpected calcification.   Skull: Normal. Negative for fracture or focal lesion.   Sinuses/Orbits: No middle ear or mastoid effusion. Mucosal thickening bilateral maxillary sinuses. Orbits are unremarkable.   Other: None.  IMPRESSION: 1. Assessment is limited due to poor signal noise ratio. Within this limitation, no acute intracranial abnormality. 2. Mucosal thickening bilateral maxillary sinuses. 3. Enlarged and partially empty sella, which is nonspecific, but can be seen in the setting of idiopathic intracranial hypertension.   MRI brain w/wo contrast (04/15/21): FINDINGS: Brain: Cerebral volume within normal limits. No significant cerebral white matter disease. No abnormal foci of restricted diffusion to suggest acute or subacute ischemia. Gray-white matter differentiation maintained. No encephalomalacia to suggest prior infarct or other insult. No evidence for acute or chronic intracranial hemorrhage.   No mass lesion, midline shift or mass effect. No hydrocephalus or extra-axial fluid collection. Pituitary gland suprasellar region normal. Midline structures intact and normal.   No abnormal enhancement. Incidental note made of a small DVA at the parasagittal anterior right frontal lobe.   Vascular: Major intracranial vascular flow voids are well maintained.   Skull and upper cervical spine: Craniocervical junction within normal limits. Visualized upper cervical spine unremarkable. Diffusely decreased T1 signal intensity seen throughout the visualized bone marrow, likely related to patient's history of anemia. No focal marrow replacing lesion. No scalp soft tissue abnormality.   Sinuses/Orbits: Globes and orbital soft tissues demonstrate no acute finding. Mild scattered mucosal thickening noted within the ethmoidal air cells.  Paranasal sinuses are otherwise clear. No mastoid effusion. Inner ear structures grossly normal.   Other: Few mildly prominent level II lymph nodes noted within the partially visualized upper neck, measuring up to 1.3 cm on the right (series 17, image 18), of uncertain significance.   IMPRESSION: 1. Normal brain MRI. No findings to explain patient's symptoms identified. 2. Few mildly prominent cervical lymph nodes within the partially visualized upper neck, of uncertain significance, and may be reactive in nature. Correlation with physical exam recommended. Finding could be further assessed with dedicated CT of the neck for further evaluation as clinically warranted.   MRA head (04/15/21): FINDINGS: Anterior circulation: Both internal carotid arteries widely patent to the termini without stenosis. A1 segments widely patent. Normal anterior communicating artery complex. Both anterior cerebral arteries widely patent to their distal aspects without stenosis. No M1 stenosis or occlusion. Normal MCA bifurcations. Distal MCA branches well perfused and symmetric.   Posterior circulation: Vertebral arteries are largely codominant and widely patent to the vertebrobasilar junction. Left PICA origin patent and normal. Right PICA not seen. Basilar widely patent to its distal aspect without stenosis. Superior cerebellar arteries patent bilaterally. Both PCA supplied via the basilar as well as small bilateral posterior component arteries. PCAs well perfused to their distal aspects without significant stenosis.   Anatomic variants: None significant.   Other: No intracranial aneurysm or other vascular abnormality.   IMPRESSION: Normal intracranial MRA. No aneurysm or other vascular abnormality.   MRV head (04/15/21): FINDINGS: Normal flow related signal and enhancement seen throughout the superior sagittal sinus to the torcula. Transverse and sigmoid sinuses are patent as are the visualized  proximal internal jugular veins. Straight sinus, vein of Galen, internal cerebral veins, and basal veins of Rosenthal appear patent. No abnormality about the cavernous sinus. Superior orbital veins appear symmetric and within normal limits. No evidence for dural sinus thrombosis.   Probable mild to moderate stenosis seen at the junction of the left transverse and sigmoid sinus, of uncertain significance in the absence of other morphologic features of idiopathic intracranial hypertension. No more than mild narrowing at the junction of the right transverse and sigmoid sinus.   Incidental note made of a small DVA at  the parasagittal anterior right frontal lobe.   IMPRESSION: Negative intracranial MRV.  No evidence for dural sinus thrombosis.  Assessment/Plan:  This is Erin [name], a 43 y.o. female with headaches. LP in 2022 showed an opening pressure of 22 cm H20. CT head showed a partially empty sella. The etiology of patient's headaches may be due to IIH as suspected, but her opening pressure was not impressive. She also did not improve with Diamox, though her dose is likely too low. She is not tolerating Diamox well though, so higher dose would likely not be possible. She is doing better with Topamax, but still having a lot of headaches and blurry vision. She is currently on topamax 50 mg BID and has been recommended to go to 75 mg BID a couple of weeks ago. Alternatively, her headaches could be migraine. Her neck pain could be contributing. Finally, she likely has undiagnosed sleep apnea which could be contributing to morning headaches.    Plan: -Patient will increase Topamax to 75 mg BID as planned -Continue home PT exercises for neck pain -Sleep study as planned next month  Return to clinic in 3 months   Jacquelyne Balint, MD

## 2023-10-21 ENCOUNTER — Encounter: Payer: Self-pay | Admitting: Cardiology

## 2023-10-21 ENCOUNTER — Ambulatory Visit: Payer: BC Managed Care – PPO | Attending: Cardiology | Admitting: Cardiology

## 2023-10-21 VITALS — BP 150/100 | HR 81 | Ht 63.0 in | Wt 233.8 lb

## 2023-10-21 DIAGNOSIS — R072 Precordial pain: Secondary | ICD-10-CM | POA: Diagnosis not present

## 2023-10-21 DIAGNOSIS — I10 Essential (primary) hypertension: Secondary | ICD-10-CM

## 2023-10-21 DIAGNOSIS — Z9189 Other specified personal risk factors, not elsewhere classified: Secondary | ICD-10-CM

## 2023-10-21 NOTE — Patient Instructions (Addendum)
 Medication Instructions:  Your physician recommends that you continue on your current medications as directed. Please refer to the Current Medication list given to you today.   After you start taking your medications next Thursday please take your blood pressure daily and send to [place] via MyChart after 1 week.   HOW TO TAKE YOUR BLOOD PRESSURE: Rest 5 minutes before taking your blood pressure. Don't smoke or drink caffeinated beverages for at least 30 minutes before. Take your blood pressure before (not after) you eat. Sit comfortably with your back supported and both feet on the floor (don't cross your legs). Elevate your arm to heart level on a table or a desk. Use the proper sized cuff. It should fit smoothly and snugly around your bare upper arm. There should be enough room to slip a fingertip under the cuff. The bottom edge of the cuff should be 1 inch above the crease of the elbow. Ideally, take 3 measurements at one sitting and record the average.  *If you need a refill on your cardiac medications before your next appointment, please call your pharmacy*   Follow-Up: At [facility], you and your health needs are our priority.  As part of our continuing mission to provide you with exceptional heart care, we have created designated Provider Care Teams.  These Care Teams include your primary Cardiologist (physician) and Advanced Practice Providers (APPs -  Physician Assistants and Nurse Practitioners) who all work together to provide you with the care you need, when you need it.    Your next appointment:   5 month(s)  Provider:   Thomasene Ripple, DO

## 2023-10-21 NOTE — Progress Notes (Signed)
 Cardio-Obstetrics Clinic  Follow Up Note   Date:  10/23/2023   ID:  Erin, [name] 1980/12/27, MRN [id number]  PCP:  Leilani Able, MD   [facility] Providers Cardiologist:  Thomasene Ripple, DO  Electrophysiologist:  None        Referring MD: Leilani Able, MD   Chief Complaint: " I am ok"  History of Present Illness:    Erin [name] is a 43 y.o. female [G3P1021] who returns for follow up of chronic hypertension.   Medical history includes PCOS, morbid obesity, hypertension.   Since her visit with me in 03/2023 she has followed with our PharmD clinic and seen [facility], NP.   She reports a recent loss of her amlodipine medication, resulting in a lapse of approximately five days. She has been taking hydrochlorothiazide without interruption. Prior to the lapse, she had been taking both medications together, which she reports resulted in better blood pressure control, with readings averaging around 128/80. She also notes that her blood pressure did not decrease as drastically as it had in the past with weight loss, and she is currently only two pounds above her pre-pregnancy weight.  In addition to her hypertension, the patient reports experiencing severe chest pain, which she has noticed occurs during a specific window of her menstrual cycle. She describes the pain as located in the chest and radiating to the arm, and it is exacerbated by walking. The pain is severe enough to cause noticeable elevations in her blood pressure. However, the patient is unsure if the elevated blood pressure is a cause or a result of the pain. The pain resolves on its own and is not present at the time of the visit.   Prior CV Studies Reviewed: The following studies were reviewed today: Reviewed TTE   Past Medical History:  Diagnosis Date   Obesity    PCOS (polycystic ovarian syndrome)    PID (pelvic inflammatory disease)    Ruptured ovarian cyst    Vaginal Pap smear, abnormal     Past  Surgical History:  Procedure Laterality Date   DILATION AND CURETTAGE OF UTERUS     LAPAROSCOPY        OB History     Gravida  3   Para  1   Term  1   Preterm      AB  2   Living  1      SAB  2   IAB      Ectopic      Multiple  0   Live Births  1               Current Medications: Current Meds  Medication Sig   amLODipine (NORVASC) 5 MG tablet Take 1 tablet (5 mg total) by mouth daily.   Blood Pressure Monitor MISC Use to check blood pressure daily.   hydrochlorothiazide (HYDRODIURIL) 25 MG tablet Take 1 tablet (25 mg total) by mouth daily.   metoprolol tartrate (LOPRESSOR) 100 MG tablet Take 2 hours prior to CT scan   Semaglutide-Weight Management (WEGOVY) 2.4 MG/0.75ML SOAJ Inject 2.4 mg into the skin once a week.   topiramate (TOPAMAX) 25 MG tablet Take 3 tablets (75 mg total) by mouth 2 (two) times daily.     Allergies:   Patient has no known allergies.   Social History   Socioeconomic History   Marital status: Married    Spouse name: Sharonda Llamas III   Number of children: Not on  file   Years of education: Not on file   Highest education level: Not on file  Occupational History   Not on file  Tobacco Use   Smoking status: Former    Types: Cigarettes    Start date: 08/2021   Smokeless tobacco: Never   Tobacco comments:    Quit 1 1/2 years ago  Vaping Use   Vaping status: Never Used  Substance and Sexual Activity   Alcohol use: Not Currently    Comment: occ   Drug use: Never   Sexual activity: Not Currently    Birth control/protection: None  Other Topics Concern   Not on file  Social History Narrative   Are you right handed or left handed? right   Are you currently employed ?    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   Who lives with you? Husband and son   What type of home do you live in: 1 story or 2 story? Three story town house    Caffeine occas   Social Drivers of Corporate investment banker  Strain: Not on file  Food Insecurity: Not on file  Transportation Needs: Not on file  Physical Activity: Not on file  Stress: Not on file  Social Connections: Unknown (03/31/2023)   Received from [facility]   Social Network    Social Network: Not on file      Family History  Problem Relation Age of Onset   Asthma Brother    Cancer Maternal Grandmother    Cancer Maternal Grandfather    Diabetes Maternal Grandfather    Stroke Paternal Grandfather       ROS:   Please see the history of present illness.     All other systems reviewed and are negative.   Labs/EKG Reviewed:    EKG:   EKG was not ordered today.    Recent Labs: 06/24/2023: TSH 1.97 08/09/2023: ALT 11; Hemoglobin 13.8; Platelets 259 08/17/2023: BUN 11; Creatinine, Ser 0.86; Potassium 4.3; Sodium 144   Recent Lipid Panel No results found for: "CHOL", "TRIG", "HDL", "CHOLHDL", "LDLCALC", "LDLDIRECT"  Physical Exam:    VS:  BP (!) 150/100 (BP Location: Right Arm, Patient Position: Sitting, Cuff Size: Normal)   Pulse 81   Ht 5\' 3"  (1.6 m)   Wt 233 lb 12.8 oz (106.1 kg)   SpO2 96%   BMI 41.42 kg/m     Wt Readings from Last 3 Encounters:  10/22/23 235 lb (106.6 kg)  10/21/23 233 lb 12.8 oz (106.1 kg)  08/17/23 252 lb 3.2 oz (114.4 kg)     GEN:  Well nourished, well developed in no acute distress HEENT: Normal NECK: No JVD; No carotid bruits LYMPHATICS: No lymphadenopathy CARDIAC: RRR, no murmurs, rubs, gallops RESPIRATORY:  Clear to auscultation without rales, wheezing or rhonchi  ABDOMEN: Soft, non-tender, non-distended MUSCULOSKELETAL:  No edema; No deformity  SKIN: Warm and dry NEUROLOGIC:  Alert and oriented x 3 PSYCHIATRIC:  Normal affect    Risk Assessment/Risk Calculators:                 ASSESSMENT & PLAN:    Hypertension Uncontrolled due to inconsistent medication use. Patient reports better control when taking both amlodipine 5mg  and hydrochlorothiazide 25mg  daily. -Resume  amlodipine 5mg  and hydrochlorothiazide 25mg  daily. -Patient to monitor blood pressure at home and report readings via MyChart.  Chest Pain Recurrent chest pain, particularly during ovulation. Coronary CTA scheduled for 11/05/2023. -Continue  with planned coronary CTA. -Discuss results and further management based on findings.  PCOS and Obesity No new complaints or changes. -Continue current management.  General Health Maintenance -Order lipid profile, to be done fasting. -Follow-up in 1-2 weeks with blood pressure readings and after coronary CTA.   Patient Instructions  Medication Instructions:  Your physician recommends that you continue on your current medications as directed. Please refer to the Current Medication list given to you today.   After you start taking your medications next Thursday please take your blood pressure daily and send to [place] via MyChart after 1 week.   HOW TO TAKE YOUR BLOOD PRESSURE: Rest 5 minutes before taking your blood pressure. Don't smoke or drink caffeinated beverages for at least 30 minutes before. Take your blood pressure before (not after) you eat. Sit comfortably with your back supported and both feet on the floor (don't cross your legs). Elevate your arm to heart level on a table or a desk. Use the proper sized cuff. It should fit smoothly and snugly around your bare upper arm. There should be enough room to slip a fingertip under the cuff. The bottom edge of the cuff should be 1 inch above the crease of the elbow. Ideally, take 3 measurements at one sitting and record the average.  *If you need a refill on your cardiac medications before your next appointment, please call your pharmacy*   Follow-Up: At [facility], you and your health needs are our priority.  As part of our continuing mission to provide you with exceptional heart care, we have created designated Provider Care Teams.  These Care Teams include your primary Cardiologist  (physician) and Advanced Practice Providers (APPs -  Physician Assistants and Nurse Practitioners) who all work together to provide you with the care you need, when you need it.    Your next appointment:   5 month(s)  Provider:   Thomasene Ripple, DO      Dispo:  Return in about 5 months (around 03/19/2024).   Medication Adjustments/Labs and Tests Ordered: Current medicines are reviewed at length with the patient today.  Concerns regarding medicines are outlined above.  Tests Ordered: No orders of the defined types were placed in this encounter.  Medication Changes: No orders of the defined types were placed in this encounter.

## 2023-10-22 ENCOUNTER — Ambulatory Visit (INDEPENDENT_AMBULATORY_CARE_PROVIDER_SITE_OTHER): Payer: BC Managed Care – PPO | Admitting: Neurology

## 2023-10-22 ENCOUNTER — Encounter: Payer: Self-pay | Admitting: Neurology

## 2023-10-22 ENCOUNTER — Other Ambulatory Visit (HOSPITAL_BASED_OUTPATIENT_CLINIC_OR_DEPARTMENT_OTHER): Payer: Self-pay

## 2023-10-22 VITALS — BP 148/100 | HR 76 | Ht 63.0 in | Wt 235.0 lb

## 2023-10-22 DIAGNOSIS — H53149 Visual discomfort, unspecified: Secondary | ICD-10-CM

## 2023-10-22 DIAGNOSIS — R0683 Snoring: Secondary | ICD-10-CM

## 2023-10-22 DIAGNOSIS — G932 Benign intracranial hypertension: Secondary | ICD-10-CM

## 2023-10-22 DIAGNOSIS — R110 Nausea: Secondary | ICD-10-CM

## 2023-10-22 DIAGNOSIS — M542 Cervicalgia: Secondary | ICD-10-CM

## 2023-10-22 NOTE — Patient Instructions (Signed)
-  Patient will increase Topamax to 75 mg BID as planned -Continue home PT exercises for neck pain -Sleep study as planned next month  I will see you again in 3 months. Please let me know if you have any questions or concerns in the meantime.  The physicians and staff at [facility] are committed to providing excellent care. You may receive a survey requesting feedback about your experience at our office. We strive to receive "very good" responses to the survey questions. If you feel that your experience would prevent you from giving the office a "very good " response, please contact our office to try to remedy the situation. We may be reached at [phone]. Thank you for taking the time out of your busy day to complete the survey.  Jacquelyne Balint, MD [facility]

## 2023-10-28 ENCOUNTER — Encounter: Payer: Self-pay | Admitting: Cardiology

## 2023-11-03 ENCOUNTER — Encounter (HOSPITAL_COMMUNITY): Payer: Self-pay

## 2023-11-05 ENCOUNTER — Ambulatory Visit (HOSPITAL_COMMUNITY)
Admission: RE | Admit: 2023-11-05 | Discharge: 2023-11-05 | Disposition: A | Discharge status: Home / Self Care | Payer: BC Managed Care – PPO | Source: Ambulatory Visit | Attending: Cardiology | Admitting: Cardiology

## 2023-11-05 DIAGNOSIS — R072 Precordial pain: Secondary | ICD-10-CM | POA: Diagnosis not present

## 2023-11-05 MED ORDER — NITROGLYCERIN 0.4 MG SL SUBL
0.8000 mg | SUBLINGUAL_TABLET | Freq: Once | SUBLINGUAL | Status: AC
  Administered 2023-11-05: 0.8 mg via SUBLINGUAL

## 2023-11-05 MED ORDER — DILTIAZEM HCL 25 MG/5ML IV SOLN
INTRAVENOUS | Status: AC
  Filled 2023-11-05: qty 5

## 2023-11-05 MED ORDER — IOHEXOL 350 MG/ML SOLN
95.0000 mL | Freq: Once | INTRAVENOUS | Status: AC | PRN
  Administered 2023-11-05: 95 mL via INTRAVENOUS

## 2023-11-05 MED ORDER — METOPROLOL TARTRATE 5 MG/5ML IV SOLN
INTRAVENOUS | Status: AC
  Filled 2023-11-05: qty 5

## 2023-11-05 MED ORDER — NITROGLYCERIN 0.4 MG SL SUBL
SUBLINGUAL_TABLET | SUBLINGUAL | Status: AC
  Filled 2023-11-05: qty 2

## 2023-11-08 DIAGNOSIS — N979 Female infertility, unspecified: Secondary | ICD-10-CM | POA: Diagnosis not present

## 2023-11-08 DIAGNOSIS — Z01419 Encounter for gynecological examination (general) (routine) without abnormal findings: Secondary | ICD-10-CM | POA: Diagnosis not present

## 2023-11-09 ENCOUNTER — Telehealth: Payer: Self-pay

## 2023-11-09 NOTE — Telephone Encounter (Signed)
-----   Message from [name] sent at 11/08/2023 10:13 AM EDT ----- Please let Erin [name] know that her coronary CTA showed no evidence of coronary artery disease. Good results! Follow up with Dr. Servando Salina on 3/25 as scheduled.

## 2023-11-09 NOTE — Telephone Encounter (Signed)
 Left message to call back

## 2023-11-10 NOTE — Telephone Encounter (Addendum)
 Called patient advised of below they verbalized understanding.

## 2023-11-16 ENCOUNTER — Ambulatory Visit: Payer: BC Managed Care – PPO | Admitting: Cardiology

## 2023-11-19 ENCOUNTER — Ambulatory Visit (HOSPITAL_BASED_OUTPATIENT_CLINIC_OR_DEPARTMENT_OTHER): Payer: BC Managed Care – PPO | Attending: Neurology | Admitting: Internal Medicine

## 2023-11-19 DIAGNOSIS — R0683 Snoring: Secondary | ICD-10-CM

## 2023-11-22 ENCOUNTER — Telehealth: Payer: Self-pay | Admitting: Cardiology

## 2023-11-22 DIAGNOSIS — K769 Liver disease, unspecified: Secondary | ICD-10-CM

## 2023-11-22 NOTE — Telephone Encounter (Signed)
 Spoke to Erskine Squibb from [facility] regarding CCTA (from 11/05/23) Over Read: 2.0 cm hyperdense liver lesion, which may represent a hepatic hemangioma. Correlation with nonemergent contrast enhanced abdomen and pelvis CT and/or hepatic ultrasound is recommended to further exclude the presence of an underlying neoplastic process.

## 2023-11-22 NOTE — Telephone Encounter (Signed)
 [name]iology w/ Call report

## 2023-11-22 NOTE — Telephone Encounter (Signed)
 Erin [name], Katlyn D, NP  P Cv Div Nl Triage Caller: Unspecified (Today, 10:23 AM) Please let Ms. Agredano know that on over read of her CT scan by radiology she has an area on her liver that looks to be a small collection of blood vessels. Recommend a right upper quadrant limited ultrasound for further evaluation. Thanks, Reather Littler, NP   Spoke with the patient- 2 identifier's used  Gave the information above. Informed her that she will receive a call from [facility] to schedule the ultrasound. She verbalized understanding.  Order placed.

## 2023-11-23 ENCOUNTER — Ambulatory Visit
Admission: RE | Admit: 2023-11-23 | Discharge: 2023-11-23 | Discharge status: Home / Self Care | Source: Ambulatory Visit | Attending: Cardiology

## 2023-11-23 DIAGNOSIS — K769 Liver disease, unspecified: Secondary | ICD-10-CM | POA: Diagnosis not present

## 2023-11-27 DIAGNOSIS — R0683 Snoring: Secondary | ICD-10-CM

## 2023-11-27 NOTE — Procedures (Signed)
 [facility] [address] [place], [place] [address] Tel: [phone]   Fax: [phone]  Polysomnography Interpretation  Patient Name:  Erin, [name] Date:  2023-11-19 Referring Physician:  Jacquelyne Balint MD  Indications for Polysomnography The patient is a 43 year old Female who is 5\' 3"  and weighs 235.0 lbs. Her BMI equals 41.6.  A full night polysomnogram was performed to evaluate for -OSA.  Medication  None reported   Polysomnogram Data A full night polysomnogram recorded the standard physiologic parameters including EEG, EOG, EMG, EKG, nasal and oral airflow.  Respiratory parameters of chest and abdominal movements were recorded with Respiratory Inductance Plethysmography belts.  Oxygen saturation was recorded by pulse oximetry.   Sleep Architecture The total recording time of the polysomnogram was 404.9 minutes.  The total sleep time was 332.0 minutes.  The patient spent 1.5% of total sleep time in Stage N1, 92.0% in Stage N2, 0.0% in Stages N3, and 6.5% in REM.  Sleep latency was 32.9 minutes.  REM latency was 280.0 minutes.  Sleep Efficiency was 82.0%.  Wake after Sleep Onset time was 40.0 minutes.  Respiratory Events The polysomnogram revealed a presence of 0 obstructive, - central, and - mixed apneas resulting in an Apnea index of 0 events per hour.  There were - hypopneas (>=3% desaturation and/or arousal) resulting in an Apnea\Hypopnea Index (AHI >=3% desaturation and/or arousal) of - events per hour.  There were - hypopneas (>=4% desaturation) resulting in an Apnea\Hypopnea Index (AHI >=4% desaturation) of 0 events per hour.  There were - Respiratory Effort Related Arousals resulting in a RERA index of - events per hour. The Respiratory Disturbance Index is 0 events per hour.  The snore index was - events per hour.  Mean oxygen saturation was 97.4%.  The lowest oxygen saturation during sleep was 90.0%.  Time spent <=88% oxygen  saturation was 0.1 minutes (-).  End Tidal CO2 during sleep ranged from - to - mmHg. End Tidal CO2 was greater than 50 mmHg for - minutes and greater than 55 mmHg for - minutes.  Limb Activity There were - total limb movements recorded, of this total, - were classified as PLMs.  PLM index was - per hour and PLM associated with Arousals index was - per hour.  Cardiac Summary The average pulse rate was - bpm.  The minimum pulse rate was - bpm while the maximum pulse rate was - bpm.  Cardiac rhythm was normal.  Comment: No significant sleep disordered breathing, snoring or movement disturbance.  Diagnosis: Normal study  Recommendations: None   This study was personally reviewed and electronically signed by: Jetty Duhamel MD Accredited Board Certified in Sleep Medicine Date/Time: 11/27/23   1:18        Diagnostic PSG Report  Patient Name: Erin, [name] Date: 2023-11-19  Date of Birth: [birth year] Study Type: Diagnostic  Age: 44 year MRN #: [id number]  Sex: Female Interpreting Physician: Jetty Duhamel [id number]  Height: 5\' 3"  Referring Physician: Jacquelyne Balint MD  Weight: 235.0 lbs Recording Tech: Cherylann Parr RPSGT RST  BMI: 41.6 Scoring Tech: Cherylann Parr RPSGT RST  ESS: 3 Neck Size: 15   Study Overview  Lights Off: 09:58:01 PM  Count Index  Lights On: 04:42:56 AM Awakenings: 13 2.3  Time in Bed: 404.9 min. Arousals: 42 7.6  Total Sleep Time: 332.0 min. AHI (>=3% Desat and/or Ar.): - -   Sleep Efficiency: 82.0% AHI (>=4% Desat): - -   Sleep  Latency: 32.9 min. Limb Movements: - -  Wake After Sleep Onset: 40.0 min. Snore: - -  REM Latency from Sleep Onset: 280.0 min. Desaturations: - -     Minimum SpO2 TST: 90.0%    Sleep Architecture  % of Time in Bed Stages Time (mins) % Sleep Time  Wake 73.0   Stage N1 5.0 1.5%  Stage N2 305.5 92.0%  Stage N3 0.0 0.0%  REM 21.5 6.5%   Arousal Summary   NREM REM Sleep Index  Respiratory Arousals - - - -  PLM Arousals - -  - -  Isolated Limb Movement Arousals - - - -  Snore Arousals - - - -  Spontaneous Arousals 40 2 42 7.6  Total 40 2 42 7.6   Limb Movement Summary   Count Index  Isolated Limb Movements - -  Periodic Limb Movements (PLMs) - -  Total Limb Movements - -  Respiratory Summary   By Sleep Stage By Body Position Total   NREM REM Supine Non-Supine   Time (min) 310.5 21.5 173.0 159.0 332.0         Obstructive Apnea - - - - -  Mixed Apnea - - - - -  Central Apnea - - - - -  Total Apneas - - - - -  Total Apnea Index - - - - -         Hypopneas (>=3% Desat and/or Ar.) - - - - -  AHI (>=3% Desat and/or Ar.) - - - - -         Hypopneas (>=4% Desat) - - - - -  AHI (>=4% Desat) - - - - -          RERAs - - - - -  RERA Index - - - - -         RDI - - - - -    Respiratory Event Type Index  Central Apneas -  Obstructive Apneas -  Mixed Apneas -  Central Hypopneas -  Obstructive Hypopneas -  Central Apnea + Hypopnea (CAHI) -  Obstructive Apnea + Hypopnea (OAHI) -   Respiratory Event Durations   Apnea Hypopnea   NREM REM NREM REM  Average (seconds) - - - -  Maximum (seconds) - - - -    Oxygen Saturation Summary   Wake NREM REM TST TIB  Average SpO2 (%) 98.7% 97.2% 97.9% 97.2% 97.4%  Minimum SpO2 (%) 70.0% 92.0% 90.0% 90.0% 70.0%  Maximum SpO2 (%) 100.0% 100.0% 100.0% 100.0% 100.0%   Oxygen Saturation Distribution  Range (%) Time in range (min) Time in range (%)  90.0 - 100.0 396.8 100.0%  80.0 - 90.0 0.1 0.0%  70.0 - 80.0 - -  60.0 - 70.0 0.1 0.0%  50.0 - 60.0 - -  0.0 - 50.0 - -  Time Spent <=88% SpO2  Range (%) Time in range (min) Time in range (%)  0.0 - 88.0 0.1 0.0%      Count Index  Desaturations - -    Cardiac Summary   Wake NREM REM Sleep Total  Average Pulse Rate (BPM) - - - - -  Minimum Pulse Rate (BPM) - - - - -  Maximum Pulse Rate (BPM) - - - - -   Pulse Rate Distribution:  Range (bpm) Time in range (min) Time in range (%)  0.0 - 40.0 - -   40.0 - 60.0 - -  60.0 - 80.0 - -  80.0 - 100.0 - -  100.0 - 120.0 - -  120.0 - 140.0 - -  140.0 - 200.0 - -   EtCO2 Summary  Stage Min (mmHg) Average (mmHg) Max (mmHg)  Wake - - -  NREM(1+2+3) - - -  REM - - -   EtCO2 Distribution:  Range (mmHg) Time in range (min) Time in range (%)  - - -  - - -  - - -  - - -  - - -     Hypnograms                         Technologist Comments  STUDY TYPE= PSG DATE OF STUDY= 11/19/2023 Patient Present here for Baseline Diagnostic study. Tech Explained the Process of Hook up and Wires. Patient Expressed clear understanding. Hook up was smooth. Patient did not Exhibit significant apneas as the study progressed through the night. Bathroom break was taking one time. No significant PLMs. Patient slept left, right and supine. ECG appeared to be normal sinus Rhythm.   Patient did not take medication at the lab. -Tech                          [employer], Biomedical engineer of Sleep Medicine  ELECTRONICALLY SIGNED ON:  11/27/2023, 1:08 PM [facility] PH: [phone]   FX: [phone] ACCREDITED BY THE AMERICAN ACADEMY OF SLEEP MEDICINE

## 2023-11-29 ENCOUNTER — Encounter: Payer: Self-pay | Admitting: Neurology

## 2024-02-09 NOTE — Progress Notes (Signed)
 NEUROLOGY FOLLOW UP OFFICE NOTE  Erin [name] [id number]  Subjective:  Erin [name] is a 43 y.o. year old right-handed female with a medical history of HTN who we last saw on 10/22/23 for headaches.  To briefly review: 06/24/23: She was having some difficulty with blood pressure and weight after having a child in 2022. Patient has had chronic headaches for many years. In 11/2022 or 12/2022, patient started having daily headaches. She describes the headache as neck pain and band like pressure to the head. It is about 3-4/10 every day. It also causes eye pain. She has occasional blurry vision. She can have a more intense headache 6-7 times per month (7-8/10). When her headaches get more intense, she has photophobia and nausea. She denies phonophobia. She gets specks in her vision or fuzzy vision like she is looking through a screen, but it does not last long. Her headaches usually start when she wakes up. Sometimes laying down helps her headache. Laying down does not seem to make the headache worse.   She had such severe headache in 03/2023 and her BP was so high that she went to cardiology. Patient was sent to ED. Patient presented to ED on 03/30/23 for headache, lightheadedness, and chest pain. Her symptoms had started 2-3 weeks prior. She was taking Sudafed for frequent sinus infections. Her symptoms became more severe around 03/30/23. Symptoms improved in ED with headache cocktail. CT head showed an empty sella, so follow up with neuro was recommended.   She followed up with [facility] on 04/05/23 and mentioned blurry vision, intermittent nausea and vomiting. Patient had LP on 05/07/23 with opening pressure of 220 mm H2O (mildly elevated) and closing pressure of 100 mm H2O after 11 cc of CSF was collected. Patient had some improvement after LP (slightly). Her vision did not significantly change after the LP.  Patient was started on Diamox 125 mg BID to TID, but has not felt well on it and does not seem  to help much with her headaches. She takes it twice a day most days. She has change in her taste and tingling in hands, feet, and tongue on it. She is able to get through this.    She is to see [facility] for evaluation tomorrow, 06/25/2023.   Caffeine  use: rare, no energy drinks Sleep: She does not sleep well. She goes at 9:30 pm and wakes up at 2 am to work out. She may go back to sleep at 4 to 5 am. She feels tired throughout the day. She is told she snores. She has been told she gasps for air at night.   Mood is okay. Not down, depressed, or anxious.   No family history of headaches.  I switched Diamox to Topamax  25 mg BID on 06/24/23.  10/22/23: Patient was seen by Erin [name] who recommended MRI of orbits due to changes seen in optic nerve. MRI orbits on 07/27/23 showed no evidence of optic neuritis but did show prominent optic nerve sheath fluid as may be seen in IIH. Patient continued to have occasional worsening headaches, so I increased her topamax  to 50 mg BID on 08/10/23. Patient had a severe headache she could not break on 10/06/23. I called and recommended a medrol  dose pak to break headache and to increase topamax  to 75 mg BID (10/07/23). The steroid dose pak did break the headache. She has not yet increased her topamax  because she had a lot going on and forgot.   She  still has blurry vision. She denies vision loss.   She has a mild headache today. She has had some increase in blood pressure and wonders if that could be contributing.   She still has not gotten her sleep study. This is scheduled 11/19/23.   She went to PT with improvement of her neck pain.  Most recent Assessment and Plan (10/22/23): This is Erin [name], a 43 y.o. female with headaches. LP in 2022 showed an opening pressure of 22 cm H20. CT head showed a partially empty sella. The etiology of patient's headaches may be due to IIH as suspected, but her opening pressure was not impressive. She also did not improve with  Diamox, though her dose is likely too low. She is not tolerating Diamox well though, so higher dose would likely not be possible. She is doing better with Topamax , but still having a lot of headaches and blurry vision. She is currently on topamax  50 mg BID and has been recommended to go to 75 mg BID a couple of weeks ago. Alternatively, her headaches could be migraine. Her neck pain could be contributing. Finally, she likely has undiagnosed sleep apnea which could be contributing to morning headaches.     Plan: -Patient will increase Topamax  to 75 mg BID as planned -Continue home PT exercises for neck pain -Sleep study as planned next month  Since their last visit: Sleep study showed no evidence of OSA.  Patient is having daily headaches lately. She is not taking her Topamax  for a couple weeks. The headaches improved some with 75 mg BID but she felt it made her too tired. The headaches lately have be 8/10. Prior headaches was 5/10.   She denies vision changes. She is still having neck stiffness. She has done PT and is doing the home exercises.  She has no new complaints.   MEDICATIONS:  Outpatient Encounter Medications as of 02/18/2024  Medication Sig   amLODipine  (NORVASC ) 5 MG tablet Take 1 tablet (5 mg total) by mouth daily.   Blood Pressure Monitor MISC Use to check blood pressure daily.   hydrochlorothiazide  (HYDRODIURIL ) 25 MG tablet Take 1 tablet (25 mg total) by mouth daily.   Semaglutide -Weight Management (WEGOVY ) 2.4 MG/0.75ML SOAJ Inject 2.4 mg into the skin once a week.   topiramate  (TOPAMAX ) 25 MG tablet Take 3 tablets (75 mg total) by mouth 2 (two) times daily.   fluticasone  (FLONASE ) 50 MCG/ACT nasal spray Place 2 sprays into both nostrils daily. (Patient not taking: Reported on 02/18/2024)   methylPREDNISolone  (MEDROL  DOSEPAK) 4 MG TBPK tablet Take 6 tablets for 1 day, then 5 tablets for 1 day, then 4 tablets for 1 day, then 3 tablets for 1 day, then 2 tablets for 1 day, then  1 tablet for 1 day, then STOP (Patient not taking: Reported on 02/18/2024)   metoprolol  tartrate (LOPRESSOR ) 100 MG tablet Take 2 hours prior to CT scan (Patient not taking: Reported on 02/18/2024)   No facility-administered encounter medications on file as of 02/18/2024.    PAST MEDICAL HISTORY: Past Medical History:  Diagnosis Date   Obesity    PCOS (polycystic ovarian syndrome)    PID (pelvic inflammatory disease)    Ruptured ovarian cyst    Vaginal Pap smear, abnormal     PAST SURGICAL HISTORY: Past Surgical History:  Procedure Laterality Date   DILATION AND CURETTAGE OF UTERUS     LAPAROSCOPY      ALLERGIES: No Known Allergies  FAMILY HISTORY: Family History  Problem Relation Age of Onset   Asthma Brother    Cancer Maternal Grandmother    Cancer Maternal Grandfather    Diabetes Maternal Grandfather    Stroke Paternal Grandfather     SOCIAL HISTORY: Social History   Tobacco Use   Smoking status: Former    Types: Cigarettes    Start date: 08/2021   Smokeless tobacco: Never   Tobacco comments:    Quit 1 1/2 years ago  Vaping Use   Vaping status: Never Used  Substance Use Topics   Alcohol use: Not Currently    Comment: occ   Drug use: Never   Social History   Social History Narrative   Are you right handed or left handed? right   Are you currently employed ?    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   Who lives with you? Husband and son   What type of home do you live in: 1 story or 2 story? Three story town house    Caffeine  occas      Objective:  Vital Signs:  BP (!) 143/98   Pulse 95   Ht 5' 3 (1.6 m)   Wt 232 lb (105.2 kg)   SpO2 100%   BMI 41.10 kg/m   General: No acute distress.  Patient appears well-groomed.   Head:  Normocephalic/atraumatic Eyes:  fundi examined, no obvious papilledema Neck: supple, reduced range of motion Lungs: Non-labored breathing on room air   Neurological Exam: Mental  status: alert and oriented, speech fluent and not dysarthric, language intact.  Cranial nerves: CN I: not tested CN II: pupils equal, round and reactive to light, visual fields intact CN III, IV, VI:  full range of motion, no nystagmus, no ptosis CN V: facial sensation intact. CN VII: upper and lower face symmetric CN VIII: hearing intact CN IX, X: uvula midline CN XI: sternocleidomastoid and trapezius muscles intact CN XII: tongue midline  Bulk & Tone: normal, no fasciculations. Motor:  muscle strength 5/5 throughout Deep Tendon Reflexes:  2+ throughout.   Sensation:  Light touch sensation intact. Finger to nose testing:  Without dysmetria.   Gait:  Normal station and stride   Labs and Imaging review: New results: Sleep study (11/19/23): Comment: No significant sleep disordered breathing, snoring or movement disturbance. Diagnosis: Normal study  Previously reviewed results: 08/17/23: BMP unremarkable Hepatic function panel unremarkable   08/09/23: CBC unremarkable   06/24/23: B12: 411 TSH wnl        Lab Results  Component Value Date    HGBA1C 5.3 05/19/2023      BMP (05/19/23) unremarkable   CBC (03/30/23) unremarkable  MRI orbits w/wo contrast (07/27/23): FINDINGS: Orbits: No orbital mass or evidence of inflammation. Normal appearance of the globes, optic nerve, extraocular muscles, orbital fat and lacrimal glands. Copious CSF signal in the optic sheaths. No posterior scleral flattening or optic mounding.   Visualized sinuses: Clear. Suspect left anterior nasal cavity polyp measuring 11 mm.   Soft tissues: No mass or inflammation is seen.   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   IMPRESSION: 1. Negative for optic neuritis or other inflammatory finding. 2. Prominent optic nerve sheath fluid, are there risk factors for pseudotumor? 3. Suspect 11 mm polyp in the  left nasal cavity   Lumbar puncture (05/07/23): FINDINGS: Fluoroscopically guided lumbar puncture performed at L4-L5 yielding approximately 11 cc of crystal clear  CSF. Opening pressure:  220 mm H2O Closing pressure:  100 mm H2O    CT head wo contrast (03/30/23): FINDINGS: Limitations: Assessment is limited due to poor signal noise ratio.   Brain: No evidence of acute infarction, hemorrhage, hydrocephalus, extra-axial collection or mass lesion/mass effect. Enlarged and partially empty sella.   Vascular: No hyperdense vessel or unexpected calcification.   Skull: Normal. Negative for fracture or focal lesion.   Sinuses/Orbits: No middle ear or mastoid effusion. Mucosal thickening bilateral maxillary sinuses. Orbits are unremarkable.   Other: None.   IMPRESSION: 1. Assessment is limited due to poor signal noise ratio. Within this limitation, no acute intracranial abnormality. 2. Mucosal thickening bilateral maxillary sinuses. 3. Enlarged and partially empty sella, which is nonspecific, but can be seen in the setting of idiopathic intracranial hypertension.   MRI brain w/wo contrast (04/15/21): FINDINGS: Brain: Cerebral volume within normal limits. No significant cerebral white matter disease. No abnormal foci of restricted diffusion to suggest acute or subacute ischemia. Gray-white matter differentiation maintained. No encephalomalacia to suggest prior infarct or other insult. No evidence for acute or chronic intracranial hemorrhage.   No mass lesion, midline shift or mass effect. No hydrocephalus or extra-axial fluid collection. Pituitary gland suprasellar region normal. Midline structures intact and normal.   No abnormal enhancement. Incidental note made of a small DVA at the parasagittal anterior right frontal lobe.   Vascular: Major intracranial vascular flow voids are well maintained.   Skull and upper cervical spine: Craniocervical junction within normal limits.  Visualized upper cervical spine unremarkable. Diffusely decreased T1 signal intensity seen throughout the visualized bone marrow, likely related to patient's history of anemia. No focal marrow replacing lesion. No scalp soft tissue abnormality.   Sinuses/Orbits: Globes and orbital soft tissues demonstrate no acute finding. Mild scattered mucosal thickening noted within the ethmoidal air cells. Paranasal sinuses are otherwise clear. No mastoid effusion. Inner ear structures grossly normal.   Other: Few mildly prominent level II lymph nodes noted within the partially visualized upper neck, measuring up to 1.3 cm on the right (series 17, image 18), of uncertain significance.   IMPRESSION: 1. Normal brain MRI. No findings to explain patient's symptoms identified. 2. Few mildly prominent cervical lymph nodes within the partially visualized upper neck, of uncertain significance, and may be reactive in nature. Correlation with physical exam recommended. Finding could be further assessed with dedicated CT of the neck for further evaluation as clinically warranted.   MRA head (04/15/21): FINDINGS: Anterior circulation: Both internal carotid arteries widely patent to the termini without stenosis. A1 segments widely patent. Normal anterior communicating artery complex. Both anterior cerebral arteries widely patent to their distal aspects without stenosis. No M1 stenosis or occlusion. Normal MCA bifurcations. Distal MCA branches well perfused and symmetric.   Posterior circulation: Vertebral arteries are largely codominant and widely patent to the vertebrobasilar junction. Left PICA origin patent and normal. Right PICA not seen. Basilar widely patent to its distal aspect without stenosis. Superior cerebellar arteries patent bilaterally. Both PCA supplied via the basilar as well as small bilateral posterior component arteries. PCAs well perfused to their distal aspects without significant  stenosis.   Anatomic variants: None significant.   Other: No intracranial aneurysm or other vascular abnormality.   IMPRESSION: Normal intracranial MRA. No aneurysm or other vascular abnormality.   MRV head (04/15/21): FINDINGS: Normal flow related signal and enhancement seen throughout the superior sagittal sinus to the torcula. Transverse and sigmoid sinuses are patent as are the visualized proximal internal  jugular veins. Straight sinus, vein of Galen, internal cerebral veins, and basal veins of Rosenthal appear patent. No abnormality about the cavernous sinus. Superior orbital veins appear symmetric and within normal limits. No evidence for dural sinus thrombosis.   Probable mild to moderate stenosis seen at the junction of the left transverse and sigmoid sinus, of uncertain significance in the absence of other morphologic features of idiopathic intracranial hypertension. No more than mild narrowing at the junction of the right transverse and sigmoid sinus.   Incidental note made of a small DVA at the parasagittal anterior right frontal lobe.   IMPRESSION: Negative intracranial MRV.  No evidence for dural sinus thrombosis.  Assessment/Plan:  This is Erin [name], a 43 y.o. 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 LP in 2022 showed an opening pressure of 22 cm H20. CT head showed a partially empty sella. MRI orbits showed prominent optic nerve sheath fluid. Patient previously tried diamox without improvement (on low dose) but could not tolerate it. She had some improvement with Topamax , but stopped taking it when I increased it to 75 mg BID due to ongoing headaches. Her headaches have worsened since stopping. She currently does not have vision symptoms. I explained the dangers of untreated IIH beyond just headaches. Patient is in agreement to going back to Topamax  50 mg BID and monitoring response.    Plan: -Reduce topamax  to 50 mg BID -Patient to call with new or worsening symptoms -Continue home PT exercises. Could consider more PT for cervicalgia in the future  Return to clinic in 6 months   Venetia Potters, MD

## 2024-02-18 ENCOUNTER — Ambulatory Visit (INDEPENDENT_AMBULATORY_CARE_PROVIDER_SITE_OTHER): Payer: BC Managed Care – PPO | Admitting: Neurology

## 2024-02-18 ENCOUNTER — Encounter: Payer: Self-pay | Admitting: Neurology

## 2024-02-18 VITALS — BP 126/93 | HR 95 | Ht 63.0 in | Wt 232.0 lb

## 2024-02-18 DIAGNOSIS — H53149 Visual discomfort, unspecified: Secondary | ICD-10-CM | POA: Diagnosis not present

## 2024-02-18 DIAGNOSIS — G932 Benign intracranial hypertension: Secondary | ICD-10-CM | POA: Diagnosis not present

## 2024-02-18 DIAGNOSIS — M542 Cervicalgia: Secondary | ICD-10-CM | POA: Diagnosis not present

## 2024-02-18 DIAGNOSIS — R110 Nausea: Secondary | ICD-10-CM

## 2024-02-18 NOTE — Patient Instructions (Addendum)
 We will reduce your Topamax  to 50 mg twice daily. I'm worried about increased intracranial pressure that could cause blindness if you take nothing. Let me know if this isn't working or you cannot tolerate taking it.  Continue the home PT exercises for your neck.  I will see you again in 6 months.  The physicians and staff at [facility] are committed to providing excellent care. You may receive a survey requesting feedback about your experience at our office. We strive to receive very good responses to the survey questions. If you feel that your experience would prevent you from giving the office a very good  response, please contact our office to try to remedy the situation. We may be reached at [phone]. Thank you for taking the time out of your busy day to complete the survey.  Venetia Potters, MD [facility]

## 2024-02-22 ENCOUNTER — Other Ambulatory Visit: Payer: Self-pay | Admitting: Cardiology

## 2024-02-22 DIAGNOSIS — I10 Essential (primary) hypertension: Secondary | ICD-10-CM

## 2024-02-23 ENCOUNTER — Other Ambulatory Visit: Payer: Self-pay

## 2024-02-29 ENCOUNTER — Encounter: Payer: Self-pay | Admitting: Neurology

## 2024-03-02 ENCOUNTER — Ambulatory Visit (INDEPENDENT_AMBULATORY_CARE_PROVIDER_SITE_OTHER)

## 2024-03-02 ENCOUNTER — Other Ambulatory Visit: Payer: Self-pay | Admitting: Neurology

## 2024-03-02 ENCOUNTER — Ambulatory Visit

## 2024-03-02 DIAGNOSIS — G8929 Other chronic pain: Secondary | ICD-10-CM

## 2024-03-02 DIAGNOSIS — R519 Headache, unspecified: Secondary | ICD-10-CM

## 2024-03-02 MED ORDER — NORTRIPTYLINE HCL 10 MG PO CAPS: 20.0000 mg | ORAL_CAPSULE | Freq: Every day | ORAL | 5 refills | Status: DC

## 2024-03-02 MED ORDER — DIPHENHYDRAMINE HCL 50 MG/ML IJ SOLN: 50.0000 mg | Freq: Once | INTRAMUSCULAR | Status: DC

## 2024-03-02 MED ORDER — METOCLOPRAMIDE HCL 5 MG/ML IJ SOLN
10.0000 mg | Freq: Once | INTRAMUSCULAR | Status: AC
  Administered 2024-03-02: 10 mg via INTRAMUSCULAR

## 2024-03-02 MED ORDER — DIPHENHYDRAMINE HCL 50 MG/ML IJ SOLN
50.0000 mg | Freq: Once | INTRAMUSCULAR | Status: AC
  Administered 2024-03-02: 25 mg via INTRAMUSCULAR

## 2024-03-02 MED ORDER — KETOROLAC TROMETHAMINE 60 MG/2ML IM SOLN
60.0000 mg | Freq: Once | INTRAMUSCULAR | Status: AC
  Administered 2024-03-02: 60 mg via INTRAMUSCULAR

## 2024-03-04 ENCOUNTER — Other Ambulatory Visit (HOSPITAL_BASED_OUTPATIENT_CLINIC_OR_DEPARTMENT_OTHER): Payer: Self-pay

## 2024-03-09 ENCOUNTER — Encounter: Payer: Self-pay | Admitting: Pharmacist

## 2024-03-09 ENCOUNTER — Other Ambulatory Visit (HOSPITAL_BASED_OUTPATIENT_CLINIC_OR_DEPARTMENT_OTHER): Payer: Self-pay

## 2024-03-09 DIAGNOSIS — I10 Essential (primary) hypertension: Secondary | ICD-10-CM

## 2024-03-09 MED ORDER — WEGOVY 2.4 MG/0.75ML ~~LOC~~ SOAJ
2.4000 mg | SUBCUTANEOUS | 1 refills | Status: DC
  Filled 2024-03-09: qty 9, 84d supply, fill #0

## 2024-03-13 DIAGNOSIS — N979 Female infertility, unspecified: Secondary | ICD-10-CM | POA: Diagnosis not present

## 2024-03-13 DIAGNOSIS — E282 Polycystic ovarian syndrome: Secondary | ICD-10-CM | POA: Diagnosis not present

## 2024-03-20 ENCOUNTER — Other Ambulatory Visit (HOSPITAL_BASED_OUTPATIENT_CLINIC_OR_DEPARTMENT_OTHER): Payer: Self-pay

## 2024-03-22 NOTE — Addendum Note (Signed)
 Addended by: DARRELL BRUCKNER on: 03/22/2024 10:49 AM   Modules accepted: Orders

## 2024-03-28 ENCOUNTER — Other Ambulatory Visit (HOSPITAL_BASED_OUTPATIENT_CLINIC_OR_DEPARTMENT_OTHER): Payer: Self-pay

## 2024-03-28 MED ORDER — NIFEDIPINE ER 30 MG PO TB24
30.0000 mg | ORAL_TABLET | Freq: Every day | ORAL | 5 refills | Status: DC
Start: 2024-03-28 — End: 2024-05-19
  Filled 2024-03-28: qty 30, 30d supply, fill #0

## 2024-03-28 NOTE — Addendum Note (Signed)
 Addended by: DARRELL BRUCKNER on: 03/28/2024 05:09 PM   Modules accepted: Orders

## 2024-04-25 ENCOUNTER — Other Ambulatory Visit (HOSPITAL_BASED_OUTPATIENT_CLINIC_OR_DEPARTMENT_OTHER): Payer: Self-pay

## 2024-04-25 ENCOUNTER — Telehealth: Admitting: Physician Assistant

## 2024-04-25 DIAGNOSIS — J069 Acute upper respiratory infection, unspecified: Secondary | ICD-10-CM

## 2024-04-25 MED ORDER — FLUTICASONE PROPIONATE 50 MCG/ACT NA SUSP
2.0000 | Freq: Every day | NASAL | 0 refills | Status: DC
  Filled 2024-04-25: qty 16, 30d supply, fill #0

## 2024-04-25 MED ORDER — AMOXICILLIN 500 MG PO CAPS
500.0000 mg | ORAL_CAPSULE | Freq: Two times a day (BID) | ORAL | 0 refills | Status: AC
  Filled 2024-04-25: qty 20, 10d supply, fill #0

## 2024-04-25 MED ORDER — BENZONATATE 100 MG PO CAPS
100.0000 mg | ORAL_CAPSULE | Freq: Three times a day (TID) | ORAL | 0 refills | Status: AC
  Filled 2024-04-25: qty 15, 5d supply, fill #0

## 2024-04-25 NOTE — Progress Notes (Signed)
 Ms. Erin, [name] are scheduled for a virtual visit with your provider today.    Just as we do with appointments in the office, we must obtain your consent to participate.  Your consent will be active for this visit and any virtual visit you may have with one of our providers in the next 365 days.    If you have a MyChart account, I can also send a copy of this consent to you electronically.  All virtual visits are billed to your insurance company just like a traditional visit in the office.  As this is a virtual visit, video technology does not allow for your provider to perform a traditional examination.  This may limit your provider's ability to fully assess your condition.  If your provider identifies any concerns that need to be evaluated in person or the need to arrange testing such as labs, EKG, etc, we will make arrangements to do so.    Although advances in technology are sophisticated, we cannot ensure that it will always work on either your end or our end.  If the connection with a video visit is poor, we may have to switch to a telephone visit.  With either a video or telephone visit, we are not always able to ensure that we have a secure connection.   I need to obtain your verbal consent now.   Are you willing to proceed with your visit today?   Erin [name] has provided verbal consent on 04/25/2024 for a virtual visit (video or telephone).   Lynden GORMAN Snuffer, PA-C 04/25/2024  9:11 AM   Date:  04/25/2024   ID:  Tifanny W Darling, DOB [birth year], MRN [id number]  Patient Location: Home Provider Location: Home Office   Participants: Patient and Provider for Visit and Wrap up  Method of visit: Video  Location of Patient: Home Location of Provider: Home Office Consent was obtain for visit over the video. Services rendered by provider: Visit was performed via video  A video enabled telemedicine application was used and I verified that I am speaking with the correct person using two  identifiers.  PCP:  Ilah Crigler, MD   Chief Complaint:  Erin [name]  History of Present Illness:    Erin [name] is a 43 y.o. female with history as stated below. Presents video telehealth for an acute care visit  Pt states that she started to have a sore throat, nasal congestion, sneezing, coughing, and a headache that started 3 days ago. States at onset of symptoms sore throat was mainly present in the mornings and would get better throughout the day. Denies any lymphadenopathy.   Denies fever.  Past Medical, Surgical, Social History, Allergies, and Medications have been Reviewed.  Past Medical History:  Diagnosis Date   Obesity    PCOS (polycystic ovarian syndrome)    PID (pelvic inflammatory disease)    Ruptured ovarian cyst    Vaginal Pap smear, abnormal     Current Meds  Medication Sig   amoxicillin  (AMOXIL ) 500 MG capsule Take 1 capsule (500 mg total) by mouth 2 (two) times daily for 10 days.   benzonatate  (TESSALON ) 100 MG capsule Take 1 capsule (100 mg total) by mouth every 8 (eight) hours for 5 days.   fluticasone  (FLONASE ) 50 MCG/ACT nasal spray Place 2 sprays into both nostrils daily.     Allergies:   Patient has no known allergies.   ROS See HPI for history of present illness.  Physical Exam Constitutional:  Appearance: Normal appearance.  HENT:     Mouth/Throat:     Pharynx: Uvula midline. Posterior oropharyngeal erythema present. No oropharyngeal exudate or uvula swelling.     Tonsils: No tonsillar exudate or tonsillar abscesses. 3+ on the right. 3+ on the left.  Pulmonary:     Effort: Pulmonary effort is normal.  Neurological:     Mental Status: She is alert.               MDM: Pt with uri sxs with sore throat and tonsillar edema. Will cover with abx for possible bacterial pharyngitis. Also sent rx for meds for supportive care for her congestion and cough   Tests Ordered: No orders of the defined types were placed in this  encounter.   Medication Changes: Meds ordered this encounter  Medications   amoxicillin  (AMOXIL ) 500 MG capsule    Sig: Take 1 capsule (500 mg total) by mouth 2 (two) times daily for 10 days.    Dispense:  20 capsule    Refill:  0   fluticasone  (FLONASE ) 50 MCG/ACT nasal spray    Sig: Place 2 sprays into both nostrils daily.    Dispense:  16 g    Refill:  0   benzonatate  (TESSALON ) 100 MG capsule    Sig: Take 1 capsule (100 mg total) by mouth every 8 (eight) hours for 5 days.    Dispense:  15 capsule    Refill:  0     Disposition:  Follow up  Signed, Erin Mcaulay GORMAN Snuffer, PA-C  04/25/2024 9:11 AM

## 2024-04-28 DIAGNOSIS — Z1231 Encounter for screening mammogram for malignant neoplasm of breast: Secondary | ICD-10-CM | POA: Diagnosis not present

## 2024-05-15 ENCOUNTER — Telehealth: Admitting: Physician Assistant

## 2024-05-15 DIAGNOSIS — R109 Unspecified abdominal pain: Secondary | ICD-10-CM

## 2024-05-15 NOTE — Progress Notes (Signed)
 Virtual Visit Consent   Erin [name], you are scheduled for a virtual visit with a [facility] provider today. Just as with appointments in the office, your consent must be obtained to participate. Your consent will be active for this visit and any virtual visit you may have with one of our providers in the next 365 days. If you have a MyChart account, a copy of this consent can be sent to you electronically.  As this is a virtual visit, video technology does not allow for your provider to perform a traditional examination. This may limit your provider's ability to fully assess your condition. If your provider identifies any concerns that need to be evaluated in person or the need to arrange testing (such as labs, EKG, etc.), we will make arrangements to do so. Although advances in technology are sophisticated, we cannot ensure that it will always work on either your end or our end. If the connection with a video visit is poor, the visit may have to be switched to a telephone visit. With either a video or telephone visit, we are not always able to ensure that we have a secure connection.  By engaging in this virtual visit, you consent to the provision of healthcare and authorize for your insurance to be billed (if applicable) for the services provided during this visit. Depending on your insurance coverage, you may receive a charge related to this service.  I need to obtain your verbal consent now. Are you willing to proceed with your visit today? Erin [name] has provided verbal consent on 05/15/2024 for a virtual visit (video or telephone). Erin [name], [place]  Date: 05/15/2024 1:09 PM   Virtual Visit via Video Note   I, Erin [name], connected with  Erin [name]  ([id number], [birth year]) on 05/15/24 at 12:45 PM EDT by a video-enabled telemedicine application and verified that I am speaking with the correct person using two identifiers.  Location: Patient: Virtual Visit Location  Patient: Home Provider: Virtual Visit Location Provider: Home Office   I discussed the limitations of evaluation and management by telemedicine and the availability of in person appointments. The patient expressed understanding and agreed to proceed.    History of Present Illness: Erin [name] is a 43 y.o. who identifies as a female who was assigned female at birth, and is being seen today for flank pain over past couple of days. Notes was very severe Sunday morning, waking her from sleep around 2AM. Some associated blood in urine but unsure if started spotting or not. Denies fever, chills or emesis. Some recent alcohol intake. No history of stone. Is actively trying to get pregnant.    HPI: HPI  Problems:  Patient Active Problem List   Diagnosis Date Noted   Accelerated hypertension 04/15/2023   Postpartum care following vaginal delivery 03/27/2021   Chronic hypertension complicating or reason for care during pregnancy, third trimester 03/26/2021   HTN in pregnancy, chronic 03/26/2021    Allergies: No Known Allergies Medications:  Current Outpatient Medications:    Blood Pressure Monitor MISC, Use to check blood pressure daily., Disp: 1 each, Rfl: 0   CLOMID 50 MG tablet, Take 50 mg by mouth daily., Disp: , Rfl:    hydrochlorothiazide  (HYDRODIURIL ) 25 MG tablet, Take 1 tablet (25 mg total) by mouth daily., Disp: 90 tablet, Rfl: 3   NIFEdipine  (ADALAT  CC) 30 MG 24 hr tablet, Take 1 tablet (30 mg total) by mouth daily., Disp: 30 tablet, Rfl: 5  nortriptyline  (PAMELOR ) 10 MG capsule, Take 2 capsules (20 mg total) by mouth at bedtime. Take 1 capsule (10 mg) at bedtime for 1 week, then increase to 2 capsules (20 mg) at bedtime thereafter, Disp: 60 capsule, Rfl: 5   Semaglutide -Weight Management (WEGOVY ) 2.4 MG/0.75ML SOAJ, Inject 2.4 mg into the skin once a week., Disp: 9 mL, Rfl: 1   topiramate  (TOPAMAX ) 25 MG tablet, Take 3 tablets (75 mg total) by mouth 2 (two) times daily., Disp: 180  tablet, Rfl: 5  Observations/Objective: Patient is well-developed, well-nourished in no acute distress.  Resting comfortably at home.  Head is normocephalic, atraumatic.  No labored breathing.  Speech is clear and coherent with logical content.  Patient is alert and oriented at baseline.   Assessment and Plan: 1. Left flank pain (Primary)  Colicky overall with episodes of mild symptoms and others of increased severity. Associated headache and nausea. No bowel or bladder changes noted. Afebrile. Needs in person evaluation for exam, urine testing/pregnancy testing and possible imaging. She agrees to in-person evaluation today. ER precautions reviewed.  Follow Up Instructions: I discussed the assessment and treatment plan with the patient. The patient was provided an opportunity to ask questions and all were answered. The patient agreed with the plan and demonstrated an understanding of the instructions.  A copy of instructions were sent to the patient via MyChart unless otherwise noted below.   The patient was advised to call back or seek an in-person evaluation if the symptoms worsen or if the condition fails to improve as anticipated.    Erin Velma Lunger, PA-C

## 2024-05-15 NOTE — Patient Instructions (Signed)
  Erin [name], thank you for joining Erin Velma Lunger, PA-C for today's virtual visit.  While this provider is not your primary care provider (PCP), if your PCP is located in our provider database this encounter information will be shared with them immediately following your visit.   A [facility] MyChart account gives you access to today's visit and all your visits, tests, and labs performed at [facility]  click here if you don't have a [facility] MyChart account or go to [email or web address]  Consent: (Patient) Erin [name] provided verbal consent for this virtual visit at the beginning of the encounter.  Current Medications:  Current Outpatient Medications:    Blood Pressure Monitor MISC, Use to check blood pressure daily., Disp: 1 each, Rfl: 0   CLOMID 50 MG tablet, Take 50 mg by mouth daily., Disp: , Rfl:    hydrochlorothiazide  (HYDRODIURIL ) 25 MG tablet, Take 1 tablet (25 mg total) by mouth daily., Disp: 90 tablet, Rfl: 3   NIFEdipine  (ADALAT  CC) 30 MG 24 hr tablet, Take 1 tablet (30 mg total) by mouth daily., Disp: 30 tablet, Rfl: 5   nortriptyline  (PAMELOR ) 10 MG capsule, Take 2 capsules (20 mg total) by mouth at bedtime. Take 1 capsule (10 mg) at bedtime for 1 week, then increase to 2 capsules (20 mg) at bedtime thereafter, Disp: 60 capsule, Rfl: 5   Semaglutide -Weight Management (WEGOVY ) 2.4 MG/0.75ML SOAJ, Inject 2.4 mg into the skin once a week., Disp: 9 mL, Rfl: 1   topiramate  (TOPAMAX ) 25 MG tablet, Take 3 tablets (75 mg total) by mouth 2 (two) times daily., Disp: 180 tablet, Rfl: 5   Medications ordered in this encounter:  No orders of the defined types were placed in this encounter.    *If you need refills on other medications prior to your next appointment, please contact your pharmacy*  Follow-Up: Call back or seek an in-person evaluation if the symptoms worsen or if the condition fails to improve as anticipated.  [facility] [phone]  Other Instructions Please use link below to be evaluated in person today.   [facility]  If you or a family member do not have a primary care provider, use the link below to schedule a visit and establish care. When you choose a [facility] primary care physician or advanced practice provider, you gain a long-term partner in health. Find a Primary Care Provider  Learn more about [facility]'s in-office and virtual care options: [facility] - Get Care Now

## 2024-05-19 ENCOUNTER — Other Ambulatory Visit (HOSPITAL_COMMUNITY): Payer: Self-pay

## 2024-05-19 ENCOUNTER — Other Ambulatory Visit (HOSPITAL_BASED_OUTPATIENT_CLINIC_OR_DEPARTMENT_OTHER): Payer: Self-pay

## 2024-05-19 ENCOUNTER — Telehealth: Payer: Self-pay | Admitting: Pharmacy Technician

## 2024-05-19 ENCOUNTER — Encounter (HOSPITAL_BASED_OUTPATIENT_CLINIC_OR_DEPARTMENT_OTHER): Payer: Self-pay

## 2024-05-19 ENCOUNTER — Other Ambulatory Visit: Payer: Self-pay

## 2024-05-19 MED ORDER — AMLODIPINE BESYLATE 5 MG PO TABS
5.0000 mg | ORAL_TABLET | Freq: Every day | ORAL | 1 refills | Status: AC
  Filled 2024-05-19: qty 90, 90d supply, fill #0
  Filled 2024-09-19: qty 90, 90d supply, fill #1

## 2024-05-19 MED ORDER — WEGOVY 0.5 MG/0.5ML ~~LOC~~ SOAJ
0.5000 mg | SUBCUTANEOUS | 0 refills | Status: DC
  Filled 2024-05-19 – 2024-05-22 (×5): qty 2, 28d supply, fill #0

## 2024-05-19 NOTE — Addendum Note (Signed)
 Addended by: DARRELL BRUCKNER on: 05/19/2024 08:18 AM   Modules accepted: Orders

## 2024-05-19 NOTE — Telephone Encounter (Signed)
   Pharmacy Patient Advocate Encounter   Received notification from CoverMyMeds that prior authorization for wegovy  is required/requested.   Insurance verification completed.   The patient is insured through [employer]  .   Per test claim: PA required; PA submitted to above mentioned insurance via Latent Key/confirmation #/EOC [id number] Status is pending

## 2024-05-22 ENCOUNTER — Other Ambulatory Visit (HOSPITAL_BASED_OUTPATIENT_CLINIC_OR_DEPARTMENT_OTHER): Payer: Self-pay

## 2024-05-22 ENCOUNTER — Other Ambulatory Visit (HOSPITAL_COMMUNITY): Payer: Self-pay

## 2024-05-22 NOTE — Telephone Encounter (Signed)
 Pharmacy Patient Advocate Encounter  Received notification from [employer] [place]  that Prior Authorization for wegovy  0.5mg  has been APPROVED from 05/19/24 to 05/19/25   PA #/Case ID/Reference #: [id number]

## 2024-06-14 NOTE — Telephone Encounter (Signed)
 Forwarded to PharmD team

## 2024-06-15 ENCOUNTER — Other Ambulatory Visit (HOSPITAL_BASED_OUTPATIENT_CLINIC_OR_DEPARTMENT_OTHER): Payer: Self-pay

## 2024-06-15 MED ORDER — WEGOVY 1 MG/0.5ML ~~LOC~~ SOAJ
1.0000 mg | SUBCUTANEOUS | 0 refills | Status: DC
  Filled 2024-06-15: qty 2, 28d supply, fill #0

## 2024-06-15 NOTE — Addendum Note (Signed)
 Addended by: Jossalyn Forgione D on: 06/15/2024 01:01 PM   Modules accepted: Orders

## 2024-06-16 ENCOUNTER — Other Ambulatory Visit (HOSPITAL_BASED_OUTPATIENT_CLINIC_OR_DEPARTMENT_OTHER): Payer: Self-pay

## 2024-07-12 ENCOUNTER — Other Ambulatory Visit: Payer: Self-pay

## 2024-07-12 ENCOUNTER — Other Ambulatory Visit (HOSPITAL_BASED_OUTPATIENT_CLINIC_OR_DEPARTMENT_OTHER): Payer: Self-pay

## 2024-07-12 MED ORDER — WEGOVY 1.7 MG/0.75ML ~~LOC~~ SOAJ
1.7000 mg | SUBCUTANEOUS | 0 refills | Status: AC
  Filled 2024-07-12: qty 3, 28d supply, fill #0

## 2024-07-12 NOTE — Addendum Note (Signed)
 Addended by: DARRELL BRUCKNER on: 07/12/2024 02:06 PM   Modules accepted: Orders

## 2024-08-11 ENCOUNTER — Other Ambulatory Visit (HOSPITAL_BASED_OUTPATIENT_CLINIC_OR_DEPARTMENT_OTHER): Payer: Self-pay

## 2024-08-11 MED ORDER — WEGOVY 2.4 MG/0.75ML ~~LOC~~ SOAJ
2.4000 mg | SUBCUTANEOUS | 1 refills | Status: AC
  Filled 2024-08-11: qty 3, 28d supply, fill #0
  Filled 2024-09-19: qty 3, 28d supply, fill #1

## 2024-08-11 NOTE — Progress Notes (Deleted)
 "  NEUROLOGY FOLLOW UP OFFICE NOTE  Erin [name] [id number]  Subjective:  Erin [name] is a 43 y.o. year old right-handed female with a medical history of HTN who we last saw on 02/18/24 for headaches.  To briefly review: 06/24/23: She was having some difficulty with blood pressure and weight after having a child in 2022. Patient has had chronic headaches for many years. In 11/2022 or 12/2022, patient started having daily headaches. She describes the headache as neck pain and band like pressure to the head. It is about 3-4/10 every day. It also causes eye pain. She has occasional blurry vision. She can have a more intense headache 6-7 times per month (7-8/10). When her headaches get more intense, she has photophobia and nausea. She denies phonophobia. She gets specks in her vision or fuzzy vision like she is looking through a screen, but it does not last long. Her headaches usually start when she wakes up. Sometimes laying down helps her headache. Laying down does not seem to make the headache worse.   She had such severe headache in 03/2023 and her BP was so high that she went to cardiology. Patient was sent to ED. Patient presented to ED on 03/30/23 for headache, lightheadedness, and chest pain. Her symptoms had started 2-3 weeks prior. She was taking Sudafed for frequent sinus infections. Her symptoms became more severe around 03/30/23. Symptoms improved in ED with headache cocktail. CT head showed an empty sella, so follow up with neuro was recommended.   She followed up with [facility] on 04/05/23 and mentioned blurry vision, intermittent nausea and vomiting. Patient had LP on 05/07/23 with opening pressure of 220 mm H2O (mildly elevated) and closing pressure of 100 mm H2O after 11 cc of CSF was collected. Patient had some improvement after LP (slightly). Her vision did not significantly change after the LP.  Patient was started on Diamox 125 mg BID to TID, but has not felt well on it and does not seem  to help much with her headaches. She takes it twice a day most days. She has change in her taste and tingling in hands, feet, and tongue on it. She is able to get through this.    She is to see Erin [name] for evaluation tomorrow, 06/25/2023.   Caffeine  use: rare, no energy drinks Sleep: She does not sleep well. She goes at 9:30 pm and wakes up at 2 am to work out. She may go back to sleep at 4 to 5 am. She feels tired throughout the day. She is told she snores. She has been told she gasps for air at night.   Mood is okay. Not down, depressed, or anxious.   No family history of headaches.   I switched Diamox to Topamax  25 mg BID on 06/24/23.   10/22/23: Patient was seen by Erin [name] who recommended MRI of orbits due to changes seen in optic nerve. MRI orbits on 07/27/23 showed no evidence of optic neuritis but did show prominent optic nerve sheath fluid as may be seen in IIH. Patient continued to have occasional worsening headaches, so I increased her topamax  to 50 mg BID on 08/10/23. Patient had a severe headache she could not break on 10/06/23. I called and recommended a medrol  dose pak to break headache and to increase topamax  to 75 mg BID (10/07/23). The steroid dose pak did break the headache. She has not yet increased her topamax  because she had a lot going on and forgot.  She still has blurry vision. She denies vision loss.   She has a mild headache today. She has had some increase in blood pressure and wonders if that could be contributing.   She still has not gotten her sleep study. This is scheduled 11/19/23.   She went to PT with improvement of her neck pain.  02/18/24: Sleep study showed no evidence of OSA.   Patient is having daily headaches lately. She is not taking her Topamax  for a couple weeks. The headaches improved some with 75 mg BID but she felt it made her too tired. The headaches lately have be 8/10. Prior headaches was 5/10.    She denies vision changes. She is still  having neck stiffness. She has done PT and is doing the home exercises.   She has no new complaints.  Most recent Assessment and Plan (02/18/24): This is Erin [name], a 43 y.o. 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 LP in 2022 showed an opening pressure of 22 cm H20. CT head showed a partially empty sella. MRI orbits showed prominent optic nerve sheath fluid. Patient previously tried diamox without improvement (on low dose) but could not tolerate it. She had some improvement with Topamax , but stopped taking it when I increased it to 75 mg BID due to ongoing headaches. Her headaches have worsened since stopping. She currently does not have vision symptoms. I explained the dangers of untreated IIH beyond just headaches. Patient is in agreement to going back to Topamax  50 mg BID and monitoring response.    Plan: -Reduce topamax  to 50 mg BID -Patient to call with new or worsening symptoms -Continue home PT exercises. Could consider more PT for cervicalgia in the future  Since their last visit: Patient messaged on 02/29/24 with continued headaches. She came to the office for a headache cocktail on 03/02/24. I also added nortriptyline  20 mg daily as she did not well tolerate higher doses of topamax  (added on 03/02/24). Getting pregnant and just doing the natural supplements now?***  Headaches? Current medications? Side effects?  MEDICATIONS:  Outpatient Encounter Medications as of 08/25/2024  Medication Sig   amLODipine  (NORVASC ) 5 MG tablet Take 1 tablet (5 mg total) by mouth daily.   Blood Pressure Monitor MISC Use to check blood pressure daily.   CLOMID 50 MG tablet Take 50 mg by mouth daily.   hydrochlorothiazide  (HYDRODIURIL ) 25 MG tablet Take 1 tablet (25 mg total) by mouth daily.   nortriptyline  (PAMELOR ) 10 MG capsule Take 2 capsules (20 mg total) by mouth at bedtime. Take 1 capsule (10 mg) at bedtime for 1 week, then  increase to 2 capsules (20 mg) at bedtime thereafter   semaglutide -weight management (WEGOVY ) 2.4 MG/0.75ML SOAJ SQ injection Inject 2.4 mg into the skin once a week.   topiramate  (TOPAMAX ) 25 MG tablet Take 3 tablets (75 mg total) by mouth 2 (two) times daily.   No facility-administered encounter medications on file as of 08/25/2024.    PAST MEDICAL HISTORY: Past Medical History:  Diagnosis Date   Obesity    PCOS (polycystic ovarian syndrome)    PID (pelvic inflammatory disease)    Ruptured ovarian cyst    Vaginal Pap smear, abnormal     PAST SURGICAL HISTORY: Past Surgical History:  Procedure Laterality Date   DILATION AND CURETTAGE OF UTERUS     LAPAROSCOPY      ALLERGIES: Allergies[1]  FAMILY HISTORY: Family History  Problem Relation Age  of Onset   Asthma Brother    Cancer Maternal Grandmother    Cancer Maternal Grandfather    Diabetes Maternal Grandfather    Stroke Paternal Grandfather     SOCIAL HISTORY: Social History[2] Social History   Social History Narrative   Are you right handed or left handed? right   Are you currently employed ?    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   Who lives with you? Husband and son   What type of home do you live in: 1 story or 2 story? Three story town house    Caffeine  occas      Objective:  Vital Signs:  There were no vitals taken for this visit.  ***  Labs and Imaging review: No new results***  Previously reviewed results: 08/17/23: BMP unremarkable Hepatic function panel unremarkable   08/09/23: CBC unremarkable   06/24/23: B12: 411 TSH wnl            Lab Results  Component Value Date    HGBA1C 5.3 05/19/2023      BMP (05/19/23) unremarkable   CBC (03/30/23) unremarkable   MRI orbits w/wo contrast (07/27/23): FINDINGS: Orbits: No orbital mass or evidence of inflammation. Normal appearance of the globes, optic nerve, extraocular muscles, orbital fat and lacrimal  glands. Copious CSF signal in the optic sheaths. No posterior scleral flattening or optic mounding.   Visualized sinuses: Clear. Suspect left anterior nasal cavity polyp measuring 11 mm.   Soft tissues: No mass or inflammation is seen.   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   IMPRESSION: 1. Negative for optic neuritis or other inflammatory finding. 2. Prominent optic nerve sheath fluid, are there risk factors for pseudotumor? 3. Suspect 11 mm polyp in the left nasal cavity   Lumbar puncture (05/07/23): FINDINGS: Fluoroscopically guided lumbar puncture performed at L4-L5 yielding approximately 11 cc of crystal clear CSF. Opening pressure:  220 mm H2O Closing pressure:  100 mm H2O    CT head wo contrast (03/30/23): FINDINGS: Limitations: Assessment is limited due to poor signal noise ratio.   Brain: No evidence of acute infarction, hemorrhage, hydrocephalus, extra-axial collection or mass lesion/mass effect. Enlarged and partially empty sella.   Vascular: No hyperdense vessel or unexpected calcification.   Skull: Normal. Negative for fracture or focal lesion.   Sinuses/Orbits: No middle ear or mastoid effusion. Mucosal thickening bilateral maxillary sinuses. Orbits are unremarkable.   Other: None.   IMPRESSION: 1. Assessment is limited due to poor signal noise ratio. Within this limitation, no acute intracranial abnormality. 2. Mucosal thickening bilateral maxillary sinuses. 3. Enlarged and partially empty sella, which is nonspecific, but can be seen in the setting of idiopathic intracranial hypertension.   MRI brain w/wo contrast (04/15/21): FINDINGS: Brain: Cerebral volume within normal limits. No significant cerebral white matter disease. No abnormal foci of restricted diffusion to suggest acute or subacute ischemia. Gray-white matter differentiation maintained. No  encephalomalacia to suggest prior infarct or other insult. No evidence for acute or chronic intracranial hemorrhage.   No mass lesion, midline shift or mass effect. No hydrocephalus or extra-axial fluid collection. Pituitary gland suprasellar region normal. Midline structures intact and normal.   No abnormal enhancement. Incidental note made of a small DVA at the parasagittal anterior right frontal lobe.   Vascular: Major intracranial vascular flow voids are well maintained.   Skull and upper cervical spine: Craniocervical junction within  normal limits. Visualized upper cervical spine unremarkable. Diffusely decreased T1 signal intensity seen throughout the visualized bone marrow, likely related to patient's history of anemia. No focal marrow replacing lesion. No scalp soft tissue abnormality.   Sinuses/Orbits: Globes and orbital soft tissues demonstrate no acute finding. Mild scattered mucosal thickening noted within the ethmoidal air cells. Paranasal sinuses are otherwise clear. No mastoid effusion. Inner ear structures grossly normal.   Other: Few mildly prominent level II lymph nodes noted within the partially visualized upper neck, measuring up to 1.3 cm on the right (series 17, image 18), of uncertain significance.   IMPRESSION: 1. Normal brain MRI. No findings to explain patient's symptoms identified. 2. Few mildly prominent cervical lymph nodes within the partially visualized upper neck, of uncertain significance, and may be reactive in nature. Correlation with physical exam recommended. Finding could be further assessed with dedicated CT of the neck for further evaluation as clinically warranted.   MRA head (04/15/21): FINDINGS: Anterior circulation: Both internal carotid arteries widely patent to the termini without stenosis. A1 segments widely patent. Normal anterior communicating artery complex. Both anterior cerebral arteries widely patent to their distal  aspects without stenosis. No M1 stenosis or occlusion. Normal MCA bifurcations. Distal MCA branches well perfused and symmetric.   Posterior circulation: Vertebral arteries are largely codominant and widely patent to the vertebrobasilar junction. Left PICA origin patent and normal. Right PICA not seen. Basilar widely patent to its distal aspect without stenosis. Superior cerebellar arteries patent bilaterally. Both PCA supplied via the basilar as well as small bilateral posterior component arteries. PCAs well perfused to their distal aspects without significant stenosis.   Anatomic variants: None significant.   Other: No intracranial aneurysm or other vascular abnormality.   IMPRESSION: Normal intracranial MRA. No aneurysm or other vascular abnormality.   MRV head (04/15/21): FINDINGS: Normal flow related signal and enhancement seen throughout the superior sagittal sinus to the torcula. Transverse and sigmoid sinuses are patent as are the visualized proximal internal jugular veins. Straight sinus, vein of Galen, internal cerebral veins, and basal veins of Rosenthal appear patent. No abnormality about the cavernous sinus. Superior orbital veins appear symmetric and within normal limits. No evidence for dural sinus thrombosis.   Probable mild to moderate stenosis seen at the junction of the left transverse and sigmoid sinus, of uncertain significance in the absence of other morphologic features of idiopathic intracranial hypertension. No more than mild narrowing at the junction of the right transverse and sigmoid sinus.   Incidental note made of a small DVA at the parasagittal anterior right frontal lobe.   IMPRESSION: Negative intracranial MRV.  No evidence for dural sinus thrombosis.  Sleep study (11/19/23): Comment: No significant sleep disordered breathing, snoring or movement disturbance. Diagnosis: Normal study  Assessment/Plan:  This is Erin [name], a 43 y.o. female  with: ***   Plan: ***  Return to clinic in ***  Total time spent reviewing records, interview, history/exam, documentation, and coordination of care on day of encounter:  *** min  Erin Potters, MD     [1] No Known Allergies [2]  Social History Tobacco Use   Smoking status: Former    Types: Cigarettes    Start date: 08/2021   Smokeless tobacco: Never   Tobacco comments:    Quit 1 1/2 years ago  Vaping Use   Vaping status: Never Used  Substance Use Topics   Alcohol use: Not Currently    Comment: occ   Drug use: Never   "

## 2024-08-11 NOTE — Addendum Note (Signed)
 Addended by: DARRELL BRUCKNER on: 08/11/2024 07:35 AM   Modules accepted: Orders

## 2024-08-25 ENCOUNTER — Ambulatory Visit: Admitting: Neurology

## 2024-09-15 ENCOUNTER — Encounter: Payer: Self-pay | Admitting: Neurology

## 2024-09-19 ENCOUNTER — Other Ambulatory Visit: Payer: Self-pay | Admitting: Cardiology

## 2024-09-19 ENCOUNTER — Other Ambulatory Visit: Payer: Self-pay

## 2024-09-19 ENCOUNTER — Other Ambulatory Visit (HOSPITAL_BASED_OUTPATIENT_CLINIC_OR_DEPARTMENT_OTHER): Payer: Self-pay

## 2024-09-19 DIAGNOSIS — I10 Essential (primary) hypertension: Secondary | ICD-10-CM

## 2024-09-19 MED ORDER — HYDROCHLOROTHIAZIDE 25 MG PO TABS
25.0000 mg | ORAL_TABLET | Freq: Every day | ORAL | 3 refills | Status: AC
  Filled 2024-09-19: qty 90, 90d supply, fill #0

## 2024-09-27 NOTE — Progress Notes (Unsigned)
 [email or web address]  NEUROLOGY FOLLOW UP OFFICE NOTE  Erin [name] [id number]  Subjective:  Erin [name] is a 44 y.o. year old right-handed female with a medical history of HTN who we last saw on 02/18/24 for headaches.  To briefly review: 06/24/23: She was having some difficulty with blood pressure and weight after having a child in 2022. Patient has had chronic headaches for many years. In 11/2022 or 12/2022, patient started having daily headaches. She describes the headache as neck pain and band like pressure to the head. It is about 3-4/10 every day. It also causes eye pain. She has occasional blurry vision. She can have a more intense headache 6-7 times per month (7-8/10). When her headaches get more intense, she has photophobia and nausea. She denies phonophobia. She gets specks in her vision or fuzzy vision like she is looking through a screen, but it does not last long. Her headaches usually start when she wakes up. Sometimes laying down helps her headache. Laying down does not seem to make the headache worse.   She had such severe headache in 03/2023 and her BP was so high that she went to cardiology. Patient was sent to ED. Patient presented to ED on 03/30/23 for headache, lightheadedness, and chest pain. Her symptoms had started 2-3 weeks prior. She was taking Sudafed for frequent sinus infections. Her symptoms became more severe around 03/30/23. Symptoms improved in ED with headache cocktail. CT head showed an empty sella, so follow up with neuro was recommended.   She followed up with [facility] on 04/05/23 and mentioned blurry vision, intermittent nausea and vomiting. Patient had LP on 05/07/23 with opening pressure of 220 mm H2O (mildly elevated) and closing pressure of 100 mm H2O after 11 cc of CSF was collected. Patient had some improvement after LP (slightly). Her vision did not significantly change after the LP.  Patient was started on Diamox 125 mg BID to TID, but has not felt well on it and does  not seem to help much with her headaches. She takes it twice a day most days. She has change in her taste and tingling in hands, feet, and tongue on it. She is able to get through this.    She is to see [facility] for evaluation tomorrow, 06/25/2023.   Caffeine  use: rare, no energy drinks Sleep: She does not sleep well. She goes at 9:30 pm and wakes up at 2 am to work out. She may go back to sleep at 4 to 5 am. She feels tired throughout the day. She is told she snores. She has been told she gasps for air at night.   Mood is okay. Not down, depressed, or anxious.   No family history of headaches.   I switched Diamox to Topamax  25 mg BID on 06/24/23.   10/22/23: Patient was seen by Cleatus Slack who recommended MRI of orbits due to changes seen in optic nerve. MRI orbits on 07/27/23 showed no evidence of optic neuritis but did show prominent optic nerve sheath fluid as may be seen in IIH. Patient continued to have occasional worsening headaches, so I increased her topamax  to 50 mg BID on 08/10/23. Patient had a severe headache she could not break on 10/06/23. I called and recommended a medrol  dose pak to break headache and to increase topamax  to 75 mg BID (10/07/23). The steroid dose pak did break the headache. She has not yet increased her topamax  because she had a lot going on and forgot.  She still has blurry vision. She denies vision loss.   She has a mild headache today. She has had some increase in blood pressure and wonders if that could be contributing.   She still has not gotten her sleep study. This is scheduled 11/19/23.   She went to PT with improvement of her neck pain.   02/18/24: Sleep study showed no evidence of OSA.   Patient is having daily headaches lately. She is not taking her Topamax  for a couple weeks. The headaches improved some with 75 mg BID but she felt it made her too tired. The headaches lately have be 8/10. Prior headaches was 5/10.    She denies vision changes. She  is still having neck stiffness. She has done PT and is doing the home exercises.   She has no new complaints.  Most recent Assessment and Plan (02/18/24): This is Erin [name], a 44 y.o. 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 LP in 2022 showed an opening pressure of 22 cm H20. CT head showed a partially empty sella. MRI orbits showed prominent optic nerve sheath fluid. Patient previously tried diamox without improvement (on low dose) but could not tolerate it. She had some improvement with Topamax , but stopped taking it when I increased it to 75 mg BID due to ongoing headaches. Her headaches have worsened since stopping. She currently does not have vision symptoms. I explained the dangers of untreated IIH beyond just headaches. Patient is in agreement to going back to Topamax  50 mg BID and monitoring response.    Plan: -Reduce topamax  to 50 mg BID -Patient to call with new or worsening symptoms -Continue home PT exercises. Could consider more PT for cervicalgia in the future  Since their last visit: Patient messaged on 02/29/24 with continued headaches. She came to the office for a headache cocktail on 03/02/24. I also added nortriptyline  20 mg daily as she did not well tolerate higher doses of topamax  (added on 03/02/24). She stopped both medications when trying to get pregnant. She is no longer trying to get pregnant. She restarted Topamax  50 mg BID about 2 months ago. She did not restart the nortriptyline .   She continues to have headaches. She has a mild headache every day (2/10). It is usually in the back of the head and more lately has been stiffness in the neck. It also includes the left shoulder. This is manageable. The headaches are worse in the morning or if she wakes up at night.   She has 2-3 more severe headaches per month (8/10). She finds extra stress contributes to this. It can last hours to days. She does not  often take anything for this.  When asking about vision, she now has glasses. She gets occasional blurry vision like a film over her eyes. She last saw [facility] in 03/2024.  MEDICATIONS:  Outpatient Encounter Medications as of 09/28/2024  Medication Sig   amLODipine  (NORVASC ) 5 MG tablet Take 1 tablet (5 mg total) by mouth daily.   Blood Pressure Monitor MISC Use to check blood pressure daily.   CLOMID 50 MG tablet Take 50 mg by mouth daily.   hydrochlorothiazide  (HYDRODIURIL ) 25 MG tablet Take 1 tablet (25 mg total) by mouth daily.   semaglutide -weight management (WEGOVY ) 2.4 MG/0.75ML SOAJ SQ injection Inject 2.4 mg into the skin once a week.   topiramate  (TOPAMAX ) 25 MG tablet Take 3 tablets (75 mg total) by mouth 2 (two)  times daily.   nortriptyline  (PAMELOR ) 10 MG capsule Take 2 capsules (20 mg total) by mouth at bedtime. Take 1 capsule (10 mg) at bedtime for 1 week, then increase to 2 capsules (20 mg) at bedtime thereafter (Patient not taking: Reported on 09/28/2024)   No facility-administered encounter medications on file as of 09/28/2024.    PAST MEDICAL HISTORY: Past Medical History:  Diagnosis Date   Obesity    PCOS (polycystic ovarian syndrome)    PID (pelvic inflammatory disease)    Ruptured ovarian cyst    Vaginal Pap smear, abnormal     PAST SURGICAL HISTORY: Past Surgical History:  Procedure Laterality Date   DILATION AND CURETTAGE OF UTERUS     LAPAROSCOPY      ALLERGIES: Allergies[1]  FAMILY HISTORY: Family History  Problem Relation Age of Onset   Asthma Brother    Cancer Maternal Grandmother    Cancer Maternal Grandfather    Diabetes Maternal Grandfather    Stroke Paternal Grandfather     SOCIAL HISTORY: Social History[2] Social History   Social History Narrative   Are you right handed or left handed? right   Are you currently employed ?    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   Who lives with you? Husband and son    What type of home do you live in: 1 story or 2 story? Three story town house    Caffeine  occas      Objective:  Vital Signs:  BP 117/87   Pulse 88   Ht 5' 3 (1.6 m)   Wt 235 lb (106.6 kg)   SpO2 98%   BMI 41.63 kg/m   General: No acute distress.  Patient appears well-groomed.   Head:  Normocephalic/atraumatic Eyes:  fundi examined, no obvious papilledema Neck: supple Heart: regular rate and rhythm Vascular: No carotid bruits.  Neurological Exam: Mental status: alert and oriented, speech fluent and not dysarthric, language intact.  Cranial nerves: CN I: not tested CN II: pupils equal, round and reactive to light, visual fields intact CN III, IV, VI:  full range of motion, no nystagmus, no ptosis CN V: facial sensation intact. CN VII: upper and lower face symmetric CN VIII: hearing intact CN IX, X: uvula midline CN XI: sternocleidomastoid and trapezius muscles intact CN XII: tongue midline  Bulk & Tone: normal, no fasciculations. Motor:  muscle strength 5/5 throughout Deep Tendon Reflexes:  2+ throughout Sensation:  Light touch sensation intact. Finger to nose testing:  Without dysmetria.    Gait:  Normal station and stride.  Romberg negative.   Labs and Imaging review: No new results  Previously reviewed results: 08/17/23: BMP unremarkable Hepatic function panel unremarkable   08/09/23: CBC unremarkable   06/24/23: B12: 411 TSH wnl            Lab Results  Component Value Date    HGBA1C 5.3 05/19/2023      BMP (05/19/23) unremarkable   CBC (03/30/23) unremarkable   MRI orbits w/wo contrast (07/27/23): FINDINGS: Orbits: No orbital mass or evidence of inflammation. Normal appearance of the globes, optic nerve, extraocular muscles, orbital fat and lacrimal glands. Copious CSF signal in the optic sheaths. No posterior scleral flattening or optic mounding.   Visualized sinuses: Clear. Suspect left anterior nasal cavity polyp measuring 11 mm.    Soft tissues: No mass or inflammation is seen.   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   IMPRESSION: 1. Negative for optic neuritis or other inflammatory finding. 2. Prominent optic nerve sheath fluid, are there risk factors for pseudotumor? 3. Suspect 11 mm polyp in the left nasal cavity   Lumbar puncture (05/07/23): FINDINGS: Fluoroscopically guided lumbar puncture performed at L4-L5 yielding approximately 11 cc of crystal clear CSF. Opening pressure:  220 mm H2O Closing pressure:  100 mm H2O    CT head wo contrast (03/30/23): FINDINGS: Limitations: Assessment is limited due to poor signal noise ratio.   Brain: No evidence of acute infarction, hemorrhage, hydrocephalus, extra-axial collection or mass lesion/mass effect. Enlarged and partially empty sella.   Vascular: No hyperdense vessel or unexpected calcification.   Skull: Normal. Negative for fracture or focal lesion.   Sinuses/Orbits: No middle ear or mastoid effusion. Mucosal thickening bilateral maxillary sinuses. Orbits are unremarkable.   Other: None.   IMPRESSION: 1. Assessment is limited due to poor signal noise ratio. Within this limitation, no acute intracranial abnormality. 2. Mucosal thickening bilateral maxillary sinuses. 3. Enlarged and partially empty sella, which is nonspecific, but can be seen in the setting of idiopathic intracranial hypertension.   MRI brain w/wo contrast (04/15/21): FINDINGS: Brain: Cerebral volume within normal limits. No significant cerebral white matter disease. No abnormal foci of restricted diffusion to suggest acute or subacute ischemia. Gray-white matter differentiation maintained. No encephalomalacia to suggest prior infarct or other insult. No evidence for acute or chronic intracranial hemorrhage.   No mass lesion, midline shift or mass effect. No hydrocephalus or extra-axial  fluid collection. Pituitary gland suprasellar region normal. Midline structures intact and normal.   No abnormal enhancement. Incidental note made of a small DVA at the parasagittal anterior right frontal lobe.   Vascular: Major intracranial vascular flow voids are well maintained.   Skull and upper cervical spine: Craniocervical junction within normal limits. Visualized upper cervical spine unremarkable. Diffusely decreased T1 signal intensity seen throughout the visualized bone marrow, likely related to patient's history of anemia. No focal marrow replacing lesion. No scalp soft tissue abnormality.   Sinuses/Orbits: Globes and orbital soft tissues demonstrate no acute finding. Mild scattered mucosal thickening noted within the ethmoidal air cells. Paranasal sinuses are otherwise clear. No mastoid effusion. Inner ear structures grossly normal.   Other: Few mildly prominent level II lymph nodes noted within the partially visualized upper neck, measuring up to 1.3 cm on the right (series 17, image 18), of uncertain significance.   IMPRESSION: 1. Normal brain MRI. No findings to explain patient's symptoms identified. 2. Few mildly prominent cervical lymph nodes within the partially visualized upper neck, of uncertain significance, and may be reactive in nature. Correlation with physical exam recommended. Finding could be further assessed with dedicated CT of the neck for further evaluation as clinically warranted.   MRA head (04/15/21): FINDINGS: Anterior circulation: Both internal carotid arteries widely patent to the termini without stenosis. A1 segments widely patent. Normal anterior communicating artery complex. Both anterior cerebral arteries widely patent to their distal aspects without stenosis. No M1 stenosis or occlusion. Normal MCA bifurcations. Distal MCA branches well perfused and symmetric.   Posterior circulation: Vertebral arteries are largely codominant  and widely patent to the vertebrobasilar junction. Left PICA origin patent and normal. Right PICA not seen. Basilar widely patent to its distal aspect without stenosis. Superior cerebellar arteries patent bilaterally. Both PCA supplied via the basilar as well as small bilateral posterior component arteries. PCAs well perfused to their distal aspects without significant stenosis.   Anatomic  variants: None significant.   Other: No intracranial aneurysm or other vascular abnormality.   IMPRESSION: Normal intracranial MRA. No aneurysm or other vascular abnormality.   MRV head (04/15/21): FINDINGS: Normal flow related signal and enhancement seen throughout the superior sagittal sinus to the torcula. Transverse and sigmoid sinuses are patent as are the visualized proximal internal jugular veins. Straight sinus, vein of Galen, internal cerebral veins, and basal veins of Rosenthal appear patent. No abnormality about the cavernous sinus. Superior orbital veins appear symmetric and within normal limits. No evidence for dural sinus thrombosis.   Probable mild to moderate stenosis seen at the junction of the left transverse and sigmoid sinus, of uncertain significance in the absence of other morphologic features of idiopathic intracranial hypertension. No more than mild narrowing at the junction of the right transverse and sigmoid sinus.   Incidental note made of a small DVA at the parasagittal anterior right frontal lobe.   IMPRESSION: Negative intracranial MRV.  No evidence for dural sinus thrombosis.   Sleep study (11/19/23): Comment: No significant sleep disordered breathing, snoring or movement disturbance. Diagnosis: Normal study  Assessment/Plan:  This is Erin [name], a 44 y.o. 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 She may also have contributions from cervicalgia. LP in 2022 showed an opening pressure of 22 cm H20. CT head showed a partially  empty sella. MRI orbits showed prominent optic nerve sheath fluid. Patient previously tried diamox without improvement (on low dose) but could not tolerate it. She had some improvement with Topamax , but stopped taking it when I increased it to 75 mg BID due to ongoing headaches. Her headaches worsened after stopping so she restarted it. I even added nortriptyline  since her last visit. She stopped both topamax  and nortriptyline  though as she was trying to get pregnant. Since no longer trying to get pregnant, she restarted topamax  50 mg BID. She is having a mild daily headache worse in the morning and blurry vision that is concerning to be from incomplete control of IIH. She has 2-3 more severe headaches per month that sound consistent with migraines. She is agreeable to try to up titrate topamax  again today.   Plan: For IIH and migraine headaches: Prevention:  Increase Topamax  75 mg BID Migraine rescue:  Start Sumatriptan  100 mg as needed at onset, can repeat in 2 hours if needed Limit use of pain relievers to no more than 2 days out of week to prevent risk of rebound or medication-overuse headache. Keep headache diary  -Medrol  dose pack to break current headache cycle -May consider PT if neck pain persists   Return to clinic in 6 months  Total time spent reviewing records, interview, history/exam, documentation, and coordination of care on day of encounter:  35 min  Venetia Potters, MD     [1] No Known Allergies [2]  Social History Tobacco Use   Smoking status: Former    Types: Cigarettes    Start date: 08/2021   Smokeless tobacco: Never   Tobacco comments:    Quit 1 1/2 years ago  Vaping Use   Vaping status: Never Used  Substance Use Topics   Alcohol use: Not Currently    Comment: occ   Drug use: Never

## 2024-09-28 ENCOUNTER — Encounter: Payer: Self-pay | Admitting: Neurology

## 2024-09-28 ENCOUNTER — Other Ambulatory Visit (HOSPITAL_BASED_OUTPATIENT_CLINIC_OR_DEPARTMENT_OTHER): Payer: Self-pay

## 2024-09-28 ENCOUNTER — Ambulatory Visit: Admitting: Neurology

## 2024-09-28 VITALS — BP 117/87 | HR 88 | Ht 63.0 in | Wt 235.0 lb

## 2024-09-28 DIAGNOSIS — G932 Benign intracranial hypertension: Secondary | ICD-10-CM

## 2024-09-28 DIAGNOSIS — M542 Cervicalgia: Secondary | ICD-10-CM

## 2024-09-28 DIAGNOSIS — G43001 Migraine without aura, not intractable, with status migrainosus: Secondary | ICD-10-CM

## 2024-09-28 MED ORDER — SUMATRIPTAN SUCCINATE 100 MG PO TABS
100.0000 mg | ORAL_TABLET | Freq: Once | ORAL | 5 refills | Status: AC | PRN
  Filled 2024-09-28: qty 9, 15d supply, fill #0

## 2024-09-28 MED ORDER — TOPIRAMATE 25 MG PO TABS: 75.0000 mg | ORAL_TABLET | Freq: Two times a day (BID) | ORAL | 5 refills | Status: AC

## 2024-09-28 MED ORDER — METHYLPREDNISOLONE 4 MG PO TBPK
ORAL_TABLET | ORAL | 0 refills | Status: AC
  Filled 2024-09-28: qty 21, 6d supply, fill #0

## 2024-09-28 NOTE — Patient Instructions (Signed)
 For IIH and migraine headaches: Prevention:  Increase Topamax  75 mg twice daily Migraine rescue:  Start Sumatriptan  100 mg as needed at onset, can repeat in 2 hours if needed Limit use of pain relievers to no more than 2 days out of week to prevent risk of rebound or medication-overuse headache. Keep headache diary  -Medrol  dose pack to break current headache cycle -May consider PT if neck pain persists  I will see you again in 6 months. Please let me know if you have any questions or concerns in the meantime.   The physicians and staff at [facility] are committed to providing excellent care. You may receive a survey requesting feedback about your experience at our office. We strive to receive very good responses to the survey questions. If you feel that your experience would prevent you from giving the office a very good  response, please contact our office to try to remedy the situation. We may be reached at [phone]. Thank you for taking the time out of your busy day to complete the survey.  Venetia Potters, MD [facility]

## 2024-09-29 ENCOUNTER — Other Ambulatory Visit (HOSPITAL_BASED_OUTPATIENT_CLINIC_OR_DEPARTMENT_OTHER): Payer: Self-pay

## 2025-01-19 ENCOUNTER — Ambulatory Visit: Admitting: Neurology

## 2025-04-25 ENCOUNTER — Ambulatory Visit: Payer: Self-pay | Admitting: Neurology
